# Patient Record
Sex: Female | Born: 1955 | Race: Black or African American | Hispanic: No | Marital: Married | Smoking: Former smoker
Health system: Southern US, Community
[De-identification: ages and names within clinical notes are randomized; demographics above are authoritative.]

## PROBLEM LIST (undated history)

## (undated) DIAGNOSIS — I10 Essential (primary) hypertension: Secondary | ICD-10-CM

## (undated) DIAGNOSIS — L97519 Non-pressure chronic ulcer of other part of right foot with unspecified severity: Secondary | ICD-10-CM

## (undated) DIAGNOSIS — K219 Gastro-esophageal reflux disease without esophagitis: Secondary | ICD-10-CM

## (undated) DIAGNOSIS — I739 Peripheral vascular disease, unspecified: Secondary | ICD-10-CM

## (undated) DIAGNOSIS — N184 Chronic kidney disease, stage 4 (severe): Secondary | ICD-10-CM

## (undated) DIAGNOSIS — E1129 Type 2 diabetes mellitus with other diabetic kidney complication: Secondary | ICD-10-CM

## (undated) DIAGNOSIS — E785 Hyperlipidemia, unspecified: Secondary | ICD-10-CM

## (undated) DIAGNOSIS — IMO0002 Reserved for concepts with insufficient information to code with codable children: Secondary | ICD-10-CM

## (undated) DIAGNOSIS — L97529 Non-pressure chronic ulcer of other part of left foot with unspecified severity: Secondary | ICD-10-CM

## (undated) DIAGNOSIS — E1165 Type 2 diabetes mellitus with hyperglycemia: Secondary | ICD-10-CM

## (undated) DIAGNOSIS — D638 Anemia in other chronic diseases classified elsewhere: Secondary | ICD-10-CM

## (undated) DIAGNOSIS — E1140 Type 2 diabetes mellitus with diabetic neuropathy, unspecified: Secondary | ICD-10-CM

## (undated) DIAGNOSIS — I878 Other specified disorders of veins: Secondary | ICD-10-CM

## (undated) DIAGNOSIS — I5030 Unspecified diastolic (congestive) heart failure: Secondary | ICD-10-CM

## (undated) DIAGNOSIS — E669 Obesity, unspecified: Secondary | ICD-10-CM

## (undated) DIAGNOSIS — Z9189 Other specified personal risk factors, not elsewhere classified: Secondary | ICD-10-CM

## (undated) HISTORY — DX: Peripheral vascular disease, unspecified: I73.9

## (undated) HISTORY — DX: Other specified personal risk factors, not elsewhere classified: Z91.89

## (undated) HISTORY — DX: Reserved for concepts with insufficient information to code with codable children: IMO0002

## (undated) HISTORY — DX: Other specified disorders of veins: I87.8

## (undated) HISTORY — DX: Type 2 diabetes mellitus with hyperglycemia: E11.65

## (undated) HISTORY — DX: Obesity, unspecified: E66.9

## (undated) HISTORY — DX: Gastro-esophageal reflux disease without esophagitis: K21.9

## (undated) HISTORY — PX: COLONOSCOPY: SHX174

## (undated) HISTORY — DX: Non-pressure chronic ulcer of other part of left foot with unspecified severity: L97.529

## (undated) HISTORY — DX: Chronic kidney disease, stage 4 (severe): N18.4

## (undated) HISTORY — DX: Essential (primary) hypertension: I10

## (undated) HISTORY — DX: Type 2 diabetes mellitus with other diabetic kidney complication: E11.29

## (undated) HISTORY — DX: Hyperlipidemia, unspecified: E78.5

## (undated) HISTORY — DX: Type 2 diabetes mellitus with diabetic neuropathy, unspecified: E11.40

## (undated) HISTORY — DX: Morbid (severe) obesity due to excess calories: E66.01

## (undated) HISTORY — DX: Anemia in other chronic diseases classified elsewhere: D63.8

## (undated) HISTORY — DX: Non-pressure chronic ulcer of other part of right foot with unspecified severity: L97.519

## (undated) HISTORY — PX: REFRACTIVE SURGERY: SHX103

## (undated) HISTORY — DX: Unspecified diastolic (congestive) heart failure: I50.30

## (undated) HISTORY — PX: EYE SURGERY: SHX253

---

## 2006-02-19 HISTORY — PX: TOE AMPUTATION: SHX809

## 2011-11-07 ENCOUNTER — Ambulatory Visit: Payer: Self-pay | Admitting: Family Medicine

## 2011-11-15 ENCOUNTER — Encounter: Payer: BC Managed Care – PPO | Attending: Family Medicine | Admitting: *Deleted

## 2011-11-15 ENCOUNTER — Encounter: Payer: Self-pay | Admitting: *Deleted

## 2011-11-15 VITALS — Ht 70.5 in | Wt 285.7 lb

## 2011-11-15 DIAGNOSIS — Z713 Dietary counseling and surveillance: Secondary | ICD-10-CM | POA: Insufficient documentation

## 2011-11-15 DIAGNOSIS — IMO0001 Reserved for inherently not codable concepts without codable children: Secondary | ICD-10-CM | POA: Insufficient documentation

## 2011-11-15 NOTE — Progress Notes (Signed)
  Patient was seen on 11/15/11 for the first of a series of three diabetes self-management courses at the [facility]. The following learning objectives were met by the patient during this course:   Defines the role of glucose and insulin  Identifies type of diabetes and pathophysiology  Defines the diagnostic criteria for diabetes and prediabetes  States the risk factors for Type 2 Diabetes  States the symptoms of Type 2 Diabetes  Defines Type 2 Diabetes treatment goals  Defines Type 2 Diabetes treatment options  States the rationale for glucose monitoring  Identifies A1C, glucose targets, and testing times  Identifies proper sharps disposal  Defines the purpose of a diabetes food plan  Identifies carbohydrate food groups  Defines effects of carbohydrate foods on glucose levels  Identifies carbohydrate choices/grams/food labels  States benefits of physical activity and effect on glucose  Review of suggested activity guidelines  Handouts given during class include:  Type 2 Diabetes: Basics Book  My Food Plan card  Follow-Up Plan: Attend core 2 and core 3 classes

## 2011-11-15 NOTE — Patient Instructions (Signed)
Goals:  Follow Diabetes Meal Plan as instructed  Eat 3 meals and 2 snacks, every 3-5 hrs  Limit carbohydrate intake to 30-45 grams carbohydrate/meal  Limit carbohydrate intake to 15 grams carbohydrate/snack  Add lean protein foods to meals/snacks  Monitor glucose levels as instructed by your doctor  Aim for 20 mins of physical activity daily  Bring food record and glucose log to your next nutrition visit

## 2011-12-06 ENCOUNTER — Encounter: Payer: BC Managed Care – PPO | Attending: Family Medicine | Admitting: Dietician

## 2011-12-06 DIAGNOSIS — E119 Type 2 diabetes mellitus without complications: Secondary | ICD-10-CM

## 2011-12-06 DIAGNOSIS — Z713 Dietary counseling and surveillance: Secondary | ICD-10-CM | POA: Insufficient documentation

## 2011-12-06 DIAGNOSIS — IMO0001 Reserved for inherently not codable concepts without codable children: Secondary | ICD-10-CM | POA: Insufficient documentation

## 2011-12-06 NOTE — Progress Notes (Signed)
  Patient was seen on 12/06/2011 for the second of a series of three diabetes self-management courses at the [facility]. The following learning objectives were met by the patient during this course:   Explain basic nutrition maintenance and quality assurance  Describe causes, symptoms and treatment of hypoglycemia and hyperglycemia  Explain how to manage diabetes during illness  Describe the importance of good nutrition for health and healthy eating strategies  List strategies to follow meal plan when dining out  Describe the effects of alcohol on glucose and how to use it safely  Describe problem solving skills for day-to-day glucose challenges  Describe strategies to use when treatment plan needs to change  Identify important factors involved in successful weight loss  Describe ways to remain physically active  Describe the impact of regular activity on insulin resistance   Handouts given in class:  Refrigerator magnet for Sick Day Guidelines  [facility] Oral medication/insulin handout  Follow-Up Plan: Patient will attend the final class of the ADA Diabetes Self-Care Education.   

## 2011-12-17 ENCOUNTER — Encounter: Payer: Self-pay | Admitting: Family Medicine

## 2011-12-17 ENCOUNTER — Ambulatory Visit (INDEPENDENT_AMBULATORY_CARE_PROVIDER_SITE_OTHER): Payer: BC Managed Care – PPO | Admitting: Family Medicine

## 2011-12-17 ENCOUNTER — Encounter (INDEPENDENT_AMBULATORY_CARE_PROVIDER_SITE_OTHER): Payer: BC Managed Care – PPO

## 2011-12-17 VITALS — BP 120/72 | HR 102 | Temp 97.6°F | Ht 70.5 in | Wt 282.0 lb

## 2011-12-17 DIAGNOSIS — I798 Other disorders of arteries, arterioles and capillaries in diseases classified elsewhere: Secondary | ICD-10-CM

## 2011-12-17 DIAGNOSIS — E1165 Type 2 diabetes mellitus with hyperglycemia: Secondary | ICD-10-CM

## 2011-12-17 DIAGNOSIS — Z7689 Persons encountering health services in other specified circumstances: Secondary | ICD-10-CM

## 2011-12-17 DIAGNOSIS — E785 Hyperlipidemia, unspecified: Secondary | ICD-10-CM

## 2011-12-17 DIAGNOSIS — Z23 Encounter for immunization: Secondary | ICD-10-CM

## 2011-12-17 DIAGNOSIS — L0390 Cellulitis, unspecified: Secondary | ICD-10-CM

## 2011-12-17 DIAGNOSIS — I739 Peripheral vascular disease, unspecified: Secondary | ICD-10-CM

## 2011-12-17 DIAGNOSIS — M7989 Other specified soft tissue disorders: Secondary | ICD-10-CM

## 2011-12-17 DIAGNOSIS — E1159 Type 2 diabetes mellitus with other circulatory complications: Secondary | ICD-10-CM

## 2011-12-17 DIAGNOSIS — I10 Essential (primary) hypertension: Secondary | ICD-10-CM

## 2011-12-17 DIAGNOSIS — Z7189 Other specified counseling: Secondary | ICD-10-CM

## 2011-12-17 LAB — COMPREHENSIVE METABOLIC PANEL
ALT: 25 U/L (ref 0–35)
AST: 20 U/L (ref 0–37)
Albumin: 3.6 g/dL (ref 3.5–5.2)
Alkaline Phosphatase: 94 U/L (ref 39–117)
BUN: 57 mg/dL — ABNORMAL HIGH (ref 6–23)
Calcium: 9.2 mg/dL (ref 8.4–10.5)
Chloride: 104 mEq/L (ref 96–112)
Creatinine, Ser: 2.8 mg/dL — ABNORMAL HIGH (ref 0.4–1.2)
Potassium: 4.1 mEq/L (ref 3.5–5.1)

## 2011-12-17 LAB — LIPID PANEL
Cholesterol: 121 mg/dL (ref 0–200)
LDL Cholesterol: 66 mg/dL (ref 0–99)
Total CHOL/HDL Ratio: 3
VLDL: 9.6 mg/dL (ref 0.0–40.0)

## 2011-12-17 MED ORDER — CLINDAMYCIN HCL 300 MG PO CAPS: 300.0000 mg | ORAL_CAPSULE | Freq: Three times a day (TID) | ORAL | Status: DC

## 2011-12-17 NOTE — Addendum Note (Signed)
Addended by: Colleen Can on: 12/17/2011 09:25 AM   Modules accepted: Orders

## 2011-12-17 NOTE — Patient Instructions (Addendum)
-  We have ordered labs or studies at this visit. It can take up to 1-2 weeks for results and processing. We will contact you with instructions IF your results are abnormal. Normal results will be released to your [facility]. If you have not heard from [place] or can not find your results in [facility] in 2 weeks please contact our office.  -We placed a referral for you to dermatology for your leg, endocrine for your diabetes and vascular as discussed. It usually takes about 1-2 weeks to process and schedule this referral. If you have not heard from [place] regarding this appointment in 2 weeks please contact our office.   -PLEASE SIGN UP FOR MYCHART TODAY   We recommend the following healthy lifestyle measures: - eat a healthy diet consisting of lots of vegetables, fruits, beans, nuts, seeds, healthy meats such as white chicken and fish and whole grains.  - avoid fried foods, fast food, processed foods, sodas, red meet and other fattening foods.  - get a least 150 minutes of aerobic exercise per week.   Follow up in: 1 month for follow up

## 2011-12-17 NOTE — Progress Notes (Signed)
Chief Complaint  Patient presents with  . Establish Care    HPI: Savannah Crawford is here to establish care. Recently moved to the area.  Has the following concerns and chronic problems:  R lower leg swelling and rash: -started about 4 months ago -skin changes, erythema and swelling, warmth, pain at time -saw physician (Dr. Ayesha Rumpf) and told kin changes were related to her diabetes -tried sivadene cream and lasix and potassium -denies: fevers, chills, malaise  DM: -seeing nutrition counselor -tx with amaryl and lantus 50 units bid and novolog by sliding scale -not on metformin because she had been on it a long time, now on amaryl - but this was stopped recently and now she is using novolog on sliding scale with meals if blood sugar elevated > 150, takes 8-10 units -hgba1c has been 12 for a long time -home BS: fasting has been 80-120; post prandial 160-220s -denies: renal failure, vision changes, polyuria, polydipsia, neuropathy, erectile dysfunction  HTN: -takes lotrel and hctz and toprol -denies: CP, SOB, palpitations, swelling -she also saw a heart doctor in [place] and she wants to establish care - she can't remember if she had and echo  HLP: -on lipitor Stable  PAD: -stable on pletal - had vascular doctor in [place] before she moved -wants to establish care with vascular doctor here  Other Providers: none  Needs tetanus vaccine and tdap Had pneumovax in past  ROS: See pertinent positives and negatives per HPI.  Past Medical History  Diagnosis Date  . Diabetes mellitus   . Hypertension   . Obesity   . Hyperlipidemia     Family History  Problem Relation Age of Onset  . Obesity Other   . Hypertension Other   . Hyperlipidemia Other   . Stroke Other   . Heart attack Other     History   Social History  . Marital Status: Married    Spouse Name: N/A    Number of Children: N/A  . Years of Education: N/A   Social History Main Topics  . Smoking status:  Never Smoker   . Smokeless tobacco: None  . Alcohol Use: None  . Drug Use: None  . Sexually Active: None   Other Topics Concern  . None   Social History Narrative  . None    Current outpatient prescriptions:amLODipine-benazepril (LOTREL) 5-40 MG per capsule, Take 1 capsule by mouth daily. , Disp: , Rfl: ;  atorvastatin (LIPITOR) 20 MG tablet, Take 20 mg by mouth daily. , Disp: , Rfl: ;  B-D ULTRAFINE III SHORT PEN 31G X 8 MM MISC, , Disp: , Rfl: ;  cilostazol (PLETAL) 100 MG tablet, Take 100 mg by mouth 2 (two) times daily. , Disp: , Rfl:  clindamycin (CLEOCIN) 300 MG capsule, Take 1 capsule (300 mg total) by mouth 3 (three) times daily., Disp: 30 capsule, Rfl: 0;  furosemide (LASIX) 40 MG tablet, Take 40 mg by mouth daily. , Disp: , Rfl: ;  glimepiride (AMARYL) 4 MG tablet, , Disp: , Rfl: ;  hydrochlorothiazide (MICROZIDE) 12.5 MG capsule, Take 12.5 mg by mouth every morning. , Disp: , Rfl: ;  KLOR-CON M10 10 MEQ tablet, Take 20 mEq by mouth daily. , Disp: , Rfl:  LANTUS SOLOSTAR 100 UNIT/ML injection, Inject 50 Units into the skin 2 (two) times daily. , Disp: , Rfl: ;  metoprolol succinate (TOPROL-XL) 25 MG 24 hr tablet, Take 25 mg by mouth daily. , Disp: , Rfl: ;  NOVOLOG FLEXPEN 100  UNIT/ML injection, Inject 12 Units into the skin 3 (three) times daily before meals. , Disp: , Rfl: ;  silver sulfADIAZINE (SILVADENE) 1 % cream, Apply 1 application topically daily. , Disp: , Rfl:   EXAM:  Filed Vitals:   12/17/11 0812  BP: 120/72  Pulse: 102  Temp: 97.6 F (36.4 C)    Body mass index is 39.89 kg/(m^2).  GENERAL: vitals reviewed and listed above, alert, oriented, appears well hydrated and in no acute distress  HEENT: atraumatic, conjunttiva clear, no obvious abnormalities on inspection of external nose and ears  NECK: no obvious masses on inspection  LUNGS: clear to auscultation bilaterally, no wheezes, rales or rhonchi, good air movement  CV: HRRR, + peripheral edema  SKIN:  swollen, erythematous R LE with chronic skin changes c/w PAD/stasis dermatitis  MS: moves all extremities without noticeable abnormality  PSYCH: pleasant and cooperative, no obvious depression or anxiety  ASSESSMENT AND PLAN:  Discussed the following assessment and plan:  1. DM (diabetes mellitus) type II uncontrolled, periph vascular disorder  Hemoglobin A1c, CMP, Ambulatory referral to Endocrinology  2. Hypertension    3. Hyperlipemia  Lipid Panel, CMP  4. Cellulitis  Ambulatory referral to Dermatology, clindamycin (CLEOCIN) 300 MG capsule  5. PAD (peripheral artery disease)  Ambulatory referral to Vascular Surgery  6. Swelling of right lower extremity  Lower Extremity Venous Duplex Right   -very complicated patient with many serious chronic problems and an acute issue of swelling and redness R LE -> abx in case cellulitis (risks/benefits discussed), refer to derm, LE [place] today, also get back in with vascular as follow in hometown by vascular prior to moved and on pletal -referral to endocrine for uncontrolled diabetes with complications, continue insulin and nutrition counseling, discussed serious implications of uncontrolled diabetes -We reviewed the PMH, PSH, FH, SH, Meds and Allergies. -We provided refills for any medications we will prescribe as needed. -We addressed current concerns per orders and patient instructions. -We have asked for records for pertinent exams, studies, vaccines and notes from previous providers. -We have advised patient to follow up per instructions below. -will need CPE once chronic and acute issues addressed -Influenza and tdap vaccine given today  -Patient advised to return or notify a doctor immediately if symptoms worsen or persist or new concerns arise.  Patient Instructions  -We have ordered labs or studies at this visit. It can take up to 1-2 weeks for results and processing. We will contact you with instructions IF your results are abnormal. Normal  results will be released to your [facility]. If you have not heard from [place] or can not find your results in [facility] in 2 weeks please contact our office.  -PLEASE SIGN UP FOR MYCHART TODAY   We recommend the following healthy lifestyle measures: - eat a healthy diet consisting of lots of vegetables, fruits, beans, nuts, seeds, healthy meats such as white chicken and fish and whole grains.  - avoid fried foods, fast food, processed foods, sodas, red meet and other fattening foods.  - get a least 150 minutes of aerobic exercise per week.   Follow up in:      Colin Benton R.

## 2011-12-18 ENCOUNTER — Encounter (HOSPITAL_COMMUNITY): Payer: Self-pay | Admitting: Emergency Medicine

## 2011-12-18 ENCOUNTER — Emergency Department (HOSPITAL_COMMUNITY)
Admission: EM | Admit: 2011-12-18 | Discharge: 2011-12-18 | Disposition: A | Discharge status: Home / Self Care | Payer: BC Managed Care – PPO | Attending: Emergency Medicine | Admitting: Emergency Medicine

## 2011-12-18 ENCOUNTER — Telehealth: Payer: Self-pay | Admitting: Family Medicine

## 2011-12-18 DIAGNOSIS — Z79899 Other long term (current) drug therapy: Secondary | ICD-10-CM | POA: Insufficient documentation

## 2011-12-18 DIAGNOSIS — N289 Disorder of kidney and ureter, unspecified: Secondary | ICD-10-CM | POA: Insufficient documentation

## 2011-12-18 DIAGNOSIS — E669 Obesity, unspecified: Secondary | ICD-10-CM | POA: Insufficient documentation

## 2011-12-18 DIAGNOSIS — E119 Type 2 diabetes mellitus without complications: Secondary | ICD-10-CM | POA: Insufficient documentation

## 2011-12-18 DIAGNOSIS — I10 Essential (primary) hypertension: Secondary | ICD-10-CM | POA: Insufficient documentation

## 2011-12-18 DIAGNOSIS — E785 Hyperlipidemia, unspecified: Secondary | ICD-10-CM | POA: Insufficient documentation

## 2011-12-18 LAB — COMPREHENSIVE METABOLIC PANEL
AST: 16 U/L (ref 0–37)
BUN: 52 mg/dL — ABNORMAL HIGH (ref 6–23)
CO2: 26 mEq/L (ref 19–32)
Calcium: 9.6 mg/dL (ref 8.4–10.5)
Chloride: 98 mEq/L (ref 96–112)
Creatinine, Ser: 2.59 mg/dL — ABNORMAL HIGH (ref 0.50–1.10)
GFR calc Af Amer: 23 mL/min — ABNORMAL LOW (ref >90)
GFR calc non Af Amer: 20 mL/min — ABNORMAL LOW (ref >90)
Glucose, Bld: 90 mg/dL (ref 70–99)
Total Bilirubin: 0.3 mg/dL (ref 0.3–1.2)

## 2011-12-18 LAB — CBC WITH DIFFERENTIAL/PLATELET
Basophils Absolute: 0 10*3/uL (ref 0.0–0.1)
Eosinophils Absolute: 0.4 10*3/uL (ref 0.0–0.7)
Eosinophils Relative: 6 % — ABNORMAL HIGH (ref 0–5)
MCH: 26 pg (ref 26.0–34.0)
MCV: 80.1 fL (ref 78.0–100.0)
Neutrophils Relative %: 48 % (ref 43–77)
Platelets: 271 10*3/uL (ref 150–400)
RDW: 14.3 % (ref 11.5–15.5)
WBC: 7.1 10*3/uL (ref 4.0–10.5)

## 2011-12-18 LAB — URINALYSIS, ROUTINE W REFLEX MICROSCOPIC
Bilirubin Urine: NEGATIVE
Hgb urine dipstick: NEGATIVE
Nitrite: NEGATIVE
Protein, ur: NEGATIVE mg/dL
Specific Gravity, Urine: 1.007 (ref 1.005–1.030)
Urobilinogen, UA: 0.2 mg/dL (ref 0.0–1.0)

## 2011-12-18 NOTE — ED Notes (Signed)
Urine sample provided if lab needs it.

## 2011-12-18 NOTE — Telephone Encounter (Signed)
New pt to me with Cr around 1.9 on labs 10/2011 now with Cr 2.8.  Very complicated pt seen once here with uncontrolled diabetes, possible cellulitis and nowlabs c/w acute renal injury. Will need evaluation in ED.  Called pt and unable to reach her. LM to call back. Spoke with spouse listed as contact and advised kidney function abnormal and needs to be seen in ED today asap. Spouse to let pt know now and pt can call me.

## 2011-12-18 NOTE — Telephone Encounter (Signed)
Talked with pt and relayed message. LE pain and erythema improving on abx. She will go to hospital for evaluation acute renal failure in the setting of uncontrolled diabetes, possible infection, PAD.

## 2011-12-18 NOTE — ED Notes (Signed)
Pt sent to ED by PCP for further evaluation of abnormal labs, per pt in ref to her kidneys. Pt denies pain, no urinary s/s of hematuria, burning or difficulty with urination

## 2011-12-18 NOTE — ED Notes (Signed)
NAD noted at time of d/c home 

## 2011-12-18 NOTE — ED Provider Notes (Signed)
History     CSN: [id number]  Arrival date & time 12/18/11  1505   First MD Initiated Contact with Patient 12/18/11 1515      Chief Complaint  Patient presents with  . Abnormal Lab    (Consider location/radiation/quality/duration/timing/severity/associated sxs/prior treatment) HPI Pt sent by PMD for elevated creatinine on routine blood work. Pt has had lower ext swelling x 2 months R > L. Doppler of RLE without DVT. Pt denies urinary changes, CP, SOB, N/V. No compalints Past Medical History  Diagnosis Date  . Diabetes mellitus   . Hypertension   . Obesity   . Hyperlipidemia     Past Surgical History  Procedure Date  . Eye surgery   . Toe amputation 2008    Family History  Problem Relation Age of Onset  . Obesity Other   . Hypertension Other   . Hyperlipidemia Other   . Stroke Other   . Heart attack Other     History  Substance Use Topics  . Smoking status: Never Smoker   . Smokeless tobacco: Not on file  . Alcohol Use: Not on file    OB History    Grav Para Term Preterm Abortions TAB SAB Ect Mult Living                  Review of Systems  Constitutional: Negative for fever and chills.  HENT: Negative for neck pain.   Respiratory: Negative for cough, shortness of breath, wheezing and stridor.   Cardiovascular: Positive for leg swelling. Negative for chest pain and palpitations.  Gastrointestinal: Negative for nausea, vomiting and abdominal pain.  Genitourinary: Negative for dysuria, frequency, hematuria and flank pain.  Skin: Negative for rash and wound.  Neurological: Negative for dizziness, weakness, light-headedness, numbness and headaches.    Allergies  Review of patient's allergies indicates no known allergies.  Home Medications   Current Outpatient Rx  Name Route Sig Dispense Refill  . AMLODIPINE BESY-BENAZEPRIL HCL 5-40 MG PO CAPS Oral Take 1 capsule by mouth daily.     . ATORVASTATIN CALCIUM 20 MG PO TABS Oral Take 20 mg by mouth daily.      Marland Kitchen CILOSTAZOL 100 MG PO TABS Oral Take 100 mg by mouth 2 (two) times daily.     Marland Kitchen CLINDAMYCIN HCL 300 MG PO CAPS Oral Take 300 mg by mouth 3 (three) times daily. For 10 days starting on 12/17/11    . FUROSEMIDE 40 MG PO TABS Oral Take 40 mg by mouth 2 (two) times daily.     Marland Kitchen HYDROCHLOROTHIAZIDE 12.5 MG PO CAPS Oral Take 12.5 mg by mouth every morning.     Marland Kitchen KLOR-CON M10 10 MEQ PO TBCR Oral Take 20 mEq by mouth daily.     Marland Kitchen LANTUS SOLOSTAR 100 UNIT/ML Casey SOLN Subcutaneous Inject 50 Units into the skin 2 (two) times daily.     Marland Kitchen METOPROLOL SUCCINATE ER 25 MG PO TB24 Oral Take 25 mg by mouth daily.     Marland Kitchen NOVOLOG FLEXPEN 100 UNIT/ML Jordan SOLN Subcutaneous Inject 8-10 Units into the skin 3 (three) times daily before meals. Only uses if over 150   - Depends on sugar reading    . BD PEN NEEDLE SHORT U/F 31G X 8 MM MISC        BP 120/70  Pulse 85  Temp 97.4 F (36.3 C) (Oral)  Resp 18  Ht 5\' 11"  (1.803 m)  Wt 250 lb (113.399 kg)  BMI 34.87 kg/m2  SpO2  97%  Physical Exam  Nursing note and vitals reviewed. Constitutional: She is oriented to person, place, and time. She appears well-developed and well-nourished. No distress.  HENT:  Head: Normocephalic and atraumatic.  Mouth/Throat: Oropharynx is clear and moist.  Eyes: EOM are normal. Pupils are equal, round, and reactive to light.  Neck: Normal range of motion. Neck supple.  Cardiovascular: Normal rate and regular rhythm.   Pulmonary/Chest: Effort normal and breath sounds normal. No respiratory distress. She has no wheezes. She has no rales. She exhibits no tenderness.  Abdominal: Soft. Bowel sounds are normal. She exhibits no mass. There is no tenderness. There is no rebound and no guarding.  Musculoskeletal: Normal range of motion. She exhibits edema (RLE > LLE swelling. No pitting. No tendernes). She exhibits no tenderness.  Neurological: She is alert and oriented to person, place, and time.       Moves all ext without deficit.  Ambulating without difficulty  Skin: Skin is warm and dry. No rash noted. No erythema.  Psychiatric: She has a normal mood and affect. Her behavior is normal.    ED Course  Procedures (including critical care time)  Labs Reviewed  COMPREHENSIVE METABOLIC PANEL - Abnormal; Notable for the following:    Sodium 134 (*)     BUN 52 (*)     Creatinine, Ser 2.59 (*)     GFR calc non Af Amer 20 (*)     GFR calc Af Amer 23 (*)     All other components within normal limits  CBC WITH DIFFERENTIAL - Abnormal; Notable for the following:    Hemoglobin 10.3 (*)     HCT 31.7 (*)     Eosinophils Relative 6 (*)     All other components within normal limits  URINALYSIS, ROUTINE W REFLEX MICROSCOPIC   No results found.   1. Renal insufficiency       MDM  Discussed with Dr Marye Round, [facility] hospitalist who reviewed pt information and recent studies. He does not believe inpatient workup of increased creatinine is warranted in this otherwise asymptomatic pt and that she can be referred by her PMD on an outpatient basis for nephrology consult.  Dr Marye Round advises holding Lotrel and Microzide. Pt is in agreement with plan. Advised to return immediately for SOB, abd pian, N/V, urinary changes or any concerns      Julianne Rice, MD 12/18/11 1825

## 2011-12-19 ENCOUNTER — Telehealth: Payer: Self-pay | Admitting: Family Medicine

## 2011-12-19 ENCOUNTER — Other Ambulatory Visit: Payer: Self-pay | Admitting: Family Medicine

## 2011-12-19 DIAGNOSIS — N189 Chronic kidney disease, unspecified: Secondary | ICD-10-CM

## 2011-12-19 DIAGNOSIS — N289 Disorder of kidney and ureter, unspecified: Secondary | ICD-10-CM

## 2011-12-19 NOTE — Progress Notes (Signed)
Pt sent to ED for acute on chronic renal insufficiency for evaluation of etiology and management. ED and hospitalist recs to see nephrology outpatient. Order placed for referral.

## 2011-12-19 NOTE — Telephone Encounter (Signed)
Pt went to ER as suggested. Pt was taken off of Lotrell and Microside. Need to discuss with Dr Maudie Mercury. Pls call.

## 2011-12-19 NOTE — Telephone Encounter (Signed)
[phone] (home)  Discussed implications of ARI with pt. She is feeling well and Cr had improved on labs in ED, which points to possible pre-renal etiology. Nephrotoxic medications held. Pt will check BP and notify me if values over 140/80. I will check on nephrology referral status. Pt reports redness, swelling and warmth in LE improving on abx. Pt will contact me if unwell, SOB, decreased urine output or any other concerns.

## 2011-12-20 ENCOUNTER — Telehealth: Payer: Self-pay

## 2011-12-20 ENCOUNTER — Encounter: Payer: BC Managed Care – PPO | Admitting: *Deleted

## 2011-12-20 DIAGNOSIS — E119 Type 2 diabetes mellitus without complications: Secondary | ICD-10-CM

## 2011-12-20 NOTE — Progress Notes (Signed)
  Patient was seen on 12/20/11 for the third of a series of three diabetes self-management courses at the [facility]. The following learning objectives were met by the patient during this course:    Describe how diabetes changes over time   Identify diabetes complications and ways to prevent them   Describe strategies that can promote heart health including lowering blood pressure and cholesterol   Describe strategies to lower dietary fat and sodium in the diet   Identify physical activities that benefit cardiovascular health   Evaluate success in meeting personal goal   Describe the belief that they can live successfully with diabetes day to day   Establish 2-3 goals that they will plan to diligently work on until they return for the free 27-month follow-up visit  The following handouts were given in class:  3 Month Follow Up Visit handout  Goal setting handout  Class evaluation form  Your patient has established the following 3 month goals for diabetes self-care:  Count carbohydrates for most meals and snacks  Increase activity at least 3 days a week  Follow-Up Plan: Patient will attend a 3 month follow-up visit for diabetes self-management education.

## 2011-12-20 NOTE — Patient Instructions (Signed)
Goals:  Follow Diabetes Meal Plan as instructed  Eat 3 meals and 2 snacks, every 3-5 hrs  Limit carbohydrate intake to 30-45 grams carbohydrate/meal  Limit carbohydrate intake to 15 grams carbohydrate/snack  Add lean protein foods to meals/snacks  Monitor glucose levels as instructed by your doctor  Aim for 30 mins of physical activity daily  Bring food record and glucose log to your next nutrition visit 

## 2011-12-20 NOTE — Telephone Encounter (Signed)
Per Dr. Maudie Mercury called pt to advise that a lab appt is needed today or tomorrow (11/1) for a bmp only.  Called and left a message for pt to return call.

## 2011-12-21 ENCOUNTER — Other Ambulatory Visit: Payer: Self-pay | Admitting: Family Medicine

## 2011-12-21 DIAGNOSIS — IMO0002 Reserved for concepts with insufficient information to code with codable children: Secondary | ICD-10-CM

## 2011-12-21 DIAGNOSIS — E1165 Type 2 diabetes mellitus with hyperglycemia: Secondary | ICD-10-CM

## 2011-12-21 NOTE — Telephone Encounter (Signed)
Spoke with pt and advised that she come in today for a bmp.  Pt states she lives in [place] and cannot come today.  Pt states she has been going to the doctor all week and she is in a financial bind.  Advised pt to come in on Monday 11/4 for lab work.

## 2011-12-24 ENCOUNTER — Other Ambulatory Visit (INDEPENDENT_AMBULATORY_CARE_PROVIDER_SITE_OTHER): Payer: BC Managed Care – PPO

## 2011-12-24 ENCOUNTER — Telehealth: Payer: Self-pay | Admitting: Family Medicine

## 2011-12-24 DIAGNOSIS — E1165 Type 2 diabetes mellitus with hyperglycemia: Secondary | ICD-10-CM

## 2011-12-24 LAB — BASIC METABOLIC PANEL
CO2: 24 mEq/L (ref 19–32)
Chloride: 109 mEq/L (ref 96–112)
Potassium: 4 mEq/L (ref 3.5–5.1)
Sodium: 139 mEq/L (ref 135–145)

## 2011-12-24 NOTE — Telephone Encounter (Signed)
Called and spoke with pt and pt is aware. Pt states the ankle part stopped swelling and rash is clearing up.  The calf of pt's leg is still swelling.  Pt states she is keeping leg elevated.  Advised pt to continue to keep leg elevated and monitor swelling and call if any new symptoms occur.

## 2011-12-24 NOTE — Telephone Encounter (Signed)
Kidney function is improving. Advise she keep appointment with nephrologist. Also, should have follow up with me in 1-2 weeks. Please ask her how her leg is doing. Thanks.

## 2011-12-31 ENCOUNTER — Other Ambulatory Visit: Payer: Self-pay | Admitting: *Deleted

## 2011-12-31 ENCOUNTER — Telehealth: Payer: Self-pay

## 2011-12-31 DIAGNOSIS — I739 Peripheral vascular disease, unspecified: Secondary | ICD-10-CM

## 2011-12-31 DIAGNOSIS — R600 Localized edema: Secondary | ICD-10-CM

## 2011-12-31 NOTE — Telephone Encounter (Signed)
Called pt to see if she wanted to

## 2012-01-08 ENCOUNTER — Encounter: Payer: Self-pay | Admitting: Vascular Surgery

## 2012-01-09 ENCOUNTER — Ambulatory Visit (INDEPENDENT_AMBULATORY_CARE_PROVIDER_SITE_OTHER): Payer: BC Managed Care – PPO | Admitting: Vascular Surgery

## 2012-01-09 ENCOUNTER — Encounter: Payer: Self-pay | Admitting: Family Medicine

## 2012-01-09 ENCOUNTER — Encounter: Payer: Self-pay | Admitting: Vascular Surgery

## 2012-01-09 ENCOUNTER — Ambulatory Visit (INDEPENDENT_AMBULATORY_CARE_PROVIDER_SITE_OTHER): Payer: BC Managed Care – PPO | Admitting: Family Medicine

## 2012-01-09 VITALS — BP 120/70 | HR 103 | Temp 97.7°F | Wt 278.0 lb

## 2012-01-09 VITALS — BP 135/74 | HR 86 | Resp 16 | Ht 70.5 in | Wt 275.0 lb

## 2012-01-09 DIAGNOSIS — R202 Paresthesia of skin: Secondary | ICD-10-CM

## 2012-01-09 DIAGNOSIS — N058 Unspecified nephritic syndrome with other morphologic changes: Secondary | ICD-10-CM

## 2012-01-09 DIAGNOSIS — E1140 Type 2 diabetes mellitus with diabetic neuropathy, unspecified: Secondary | ICD-10-CM

## 2012-01-09 DIAGNOSIS — R609 Edema, unspecified: Secondary | ICD-10-CM

## 2012-01-09 DIAGNOSIS — N184 Chronic kidney disease, stage 4 (severe): Secondary | ICD-10-CM | POA: Insufficient documentation

## 2012-01-09 DIAGNOSIS — I739 Peripheral vascular disease, unspecified: Secondary | ICD-10-CM

## 2012-01-09 DIAGNOSIS — E1149 Type 2 diabetes mellitus with other diabetic neurological complication: Secondary | ICD-10-CM

## 2012-01-09 DIAGNOSIS — E1159 Type 2 diabetes mellitus with other circulatory complications: Secondary | ICD-10-CM | POA: Insufficient documentation

## 2012-01-09 DIAGNOSIS — E1142 Type 2 diabetes mellitus with diabetic polyneuropathy: Secondary | ICD-10-CM

## 2012-01-09 DIAGNOSIS — R600 Localized edema: Secondary | ICD-10-CM

## 2012-01-09 DIAGNOSIS — I10 Essential (primary) hypertension: Secondary | ICD-10-CM

## 2012-01-09 DIAGNOSIS — E785 Hyperlipidemia, unspecified: Secondary | ICD-10-CM

## 2012-01-09 DIAGNOSIS — IMO0002 Reserved for concepts with insufficient information to code with codable children: Secondary | ICD-10-CM

## 2012-01-09 DIAGNOSIS — R209 Unspecified disturbances of skin sensation: Secondary | ICD-10-CM

## 2012-01-09 DIAGNOSIS — R200 Anesthesia of skin: Secondary | ICD-10-CM | POA: Insufficient documentation

## 2012-01-09 DIAGNOSIS — M7989 Other specified soft tissue disorders: Secondary | ICD-10-CM

## 2012-01-09 DIAGNOSIS — E1129 Type 2 diabetes mellitus with other diabetic kidney complication: Secondary | ICD-10-CM

## 2012-01-09 DIAGNOSIS — E1121 Type 2 diabetes mellitus with diabetic nephropathy: Secondary | ICD-10-CM

## 2012-01-09 HISTORY — DX: Chronic kidney disease, stage 4 (severe): N18.4

## 2012-01-09 HISTORY — DX: Type 2 diabetes mellitus with diabetic neuropathy, unspecified: E11.40

## 2012-01-09 NOTE — Progress Notes (Signed)
[facility]  Patient name: Savannah Crawford MRN: [id number] DOB: 12-31-1955 Sex: female  REASON FOR CONSULT: evaluate for peripheral vascular disease. Referred by Dr.Hannah Maudie Mercury  HPI: Savannah Crawford is a 56 y.o. female who states that she has been having some swelling in her legs since July. Her symptoms are more significant on the right side. Her symptoms began gradually. She denies any previous history of DVT or phlebitis. She has had no injury to her leg. She gives no history of claudication or rest pain. She has noted some darkish discoloration of the skin her right leg. She denies any open ulcers on her feet.  I did review her records from Dr. Julianne Rice office. She does have a history of diabetes which is been under good control. Likewise she has hypertension and hypercholesterolemia which are under good control.  Past Medical History  Diagnosis Date  . Diabetes mellitus   . Hypertension   . Obesity   . Hyperlipidemia   . Peripheral arterial disease     Family History  Problem Relation Age of Onset  . Obesity Other   . Hypertension Other   . Hyperlipidemia Other   . Stroke Other   . Heart attack Other   . Diabetes Mother   . Hyperlipidemia Mother   . Hypertension Mother   . Heart attack Mother   . Stroke Mother   . Diabetes Sister   . Hypertension Brother   . Diabetes Sister   . Diabetes Sister   . Diabetes Sister     SOCIAL HISTORY: History  Substance Use Topics  . Smoking status: Former Smoker    Types: Cigarettes    Quit date: 02/20/1999  . Smokeless tobacco: Never Used  . Alcohol Use: Yes    No Known Allergies  Current Outpatient Prescriptions  Medication Sig Dispense Refill  . atorvastatin (LIPITOR) 20 MG tablet Take 20 mg by mouth daily.       . B-D ULTRAFINE III SHORT PEN 31G X 8 MM MISC       . cilostazol (PLETAL) 100 MG tablet Take 100 mg by mouth 2 (two) times daily.       . fluocinonide cream (LIDEX) 0.05 %       .  furosemide (LASIX) 40 MG tablet Take 40 mg by mouth 2 (two) times daily.       . hydrochlorothiazide (MICROZIDE) 12.5 MG capsule Take 12.5 mg by mouth every morning.       Marland Kitchen LANTUS SOLOSTAR 100 UNIT/ML injection Inject 50 Units into the skin 2 (two) times daily.       Marland Kitchen lisinopril (PRINIVIL,ZESTRIL) 5 MG tablet Take 5 mg by mouth daily.       . metoprolol succinate (TOPROL-XL) 25 MG 24 hr tablet Take 25 mg by mouth daily.       Marland Kitchen NOVOLOG FLEXPEN 100 UNIT/ML injection Inject 8-10 Units into the skin 3 (three) times daily before meals. Only uses if over 150   - Depends on sugar reading      . Vitamin D, Ergocalciferol, (DRISDOL) 50000 UNITS CAPS 1 Units.       Marland Kitchen amLODipine-benazepril (LOTREL) 5-40 MG per capsule Take 1 capsule by mouth daily.       . clindamycin (CLEOCIN) 300 MG capsule Take 300 mg by mouth 3 (three) times daily. For 10 days starting on 12/17/11      . KLOR-CON M10 10 MEQ tablet Take 20 mEq by mouth daily.  REVIEW OF SYSTEMS: Valu.Nieves ] denotes positive finding; [  ] denotes negative finding  CARDIOVASCULAR:  [ ]  chest pain   [ ]  chest pressure   [ ]  palpitations   [ ]  orthopnea   Valu.Nieves ] dyspnea on exertion   [ ]  claudication   [ ]  rest pain   [ ]  DVT   [ ]  phlebitis PULMONARY:   [ ]  productive cough   [ ]  asthma   [ ]  wheezing NEUROLOGIC:   [ ]  weakness  [ ]  paresthesias  [ ]  aphasia  [ ]  amaurosis  Valu.Nieves ] dizziness HEMATOLOGIC:   [ ]  bleeding problems   [ ]  clotting disorders MUSCULOSKELETAL:  [ ]  joint pain   [ ]  joint swelling Valu.Nieves ] leg swelling GASTROINTESTINAL: [ ]   blood in stool  [ ]   hematemesis GENITOURINARY:  [ ]   dysuria  [ ]   hematuria PSYCHIATRIC:  [ ]  history of major depression INTEGUMENTARY:  Valu.Nieves ] rashes  Valu.Nieves ] ulcers CONSTITUTIONAL:  [ ]  fever   [ ]  chills  PHYSICAL EXAM: Filed Vitals:   01/09/12 1413  BP: 135/74  Pulse: 86  Resp: 16  Height: 5' 10.5" (1.791 m)  Weight: 275 lb (124.739 kg)  SpO2: 97%   Body mass index is 38.90 kg/(m^2). GENERAL: The  patient is a well-nourished female, in no acute distress. The vital signs are documented above. CARDIOVASCULAR: There is a regular rate and rhythm. I do not detect carotid bruits. She has palpable dorsalis pedis pulses bilaterally. She has mild bilateral lower extremity swelling. This is more significant on the right side. PULMONARY: There is good air exchange bilaterally without wheezing or rales. ABDOMEN: Soft and non-tender with normal pitched bowel sounds.  MUSCULOSKELETAL: There are no major deformities or cyanosis. NEUROLOGIC: No focal weakness or paresthesias are detected. SKIN: she has hyperpigmentation in the right leg consistent with chronic venous insufficiency.PSYCHIATRIC: The patient has a normal affect.  DATA:  I have independently interpreted her arterial Doppler study which shows triphasic Doppler signals in the posterior tibial and dorsalis pedis positions bilaterally. ABI on the right is 95%. ABI on the left is 96%. Toe pressure on the right is 133 mm of mercury. Toe pressure on the left is 146 mm of mercury. All these findings are consistent with normal arterial flow.  I have also independently interpreted her venous duplex study. This shows no evidence of DVT of the right lower extremity. In addition there is no evidence of significant reflux in the common femoral vein common femoral vein, popliteal vein or saphenous vein.  MEDICAL ISSUES: I reassured the patient that based on her exam and Doppler study she has no evidence of arterial insufficiency. Given that she has hyperpigmentation of the right leg I suspect that she does have some chronic venous insufficiency or possibly some mild lymphedema which explains her swelling. However there was no gross reflux seen on her venous duplex study. I did reassure her that she has no evidence of DVT. I have recommended intermittent leg elevation and I have discussed the proper position for this. In addition I have written her a prescription  for a knee-high compression stockings with a 20-30 mm mercury gradient. I've also instructed her to keep her skin well lubricated especially in the winter. I will be happy to see her back at any time if any new vascular issues arise.   [place] [facility] [place] Beeper: [phone]

## 2012-01-09 NOTE — Progress Notes (Signed)
Right lower extremity venous duplex performed @ [facility] 01/09/2012

## 2012-01-09 NOTE — Progress Notes (Signed)
Chief Complaint  Patient presents with  . Follow-up    HPI:  Follow up: Relatively new patient to me with MMP.  DM: -home BS: fasting 65-102, postprandial highest is 2 "something", on has values over 200 very rarely the last 3 months -meds: lantus 50 units twice dialy , novolog with meals if blood sugar is greater the 200 before meals - has not needed this lately -eye exam: last exam in June in [place] -foot exam: used to see a podiatrist in [place], does not have podiatrist here, + neuropathy -denies: fevers, wounds, polyuria, polydipsia -pt referred to endocrine last visit due to her self reported chronic very poorly controlled Diabetes -currently seeing nutritionist and working on diet, getting ready to start going to the gym  - went to zumba class a few days ago -hgbA1c 9.9 11/2011  AOCRI: -acute kidney injury on labs last visit -seen in ED and felt stable to see renal outpatient -referred to nephrology and nephrotoxic meds held - saw renal doctor last week -nephrologist restarted: lisinopril 5mg  daily and lasix which she had been prescribed for bilateral LE edema -she has follow up with nephrologist in 1 month and she is going to have a renal US then as well -She [facility] at [facility] and has Chronic Kidney Disease Stage IV (severe) but labs looked better per patient  HTN: -HTCZ, toprol and lotrel and lisinopril just added back -Denies: CP, SOB, increased swelling  LE Edema: -reports saw a cardiologist in [place] in March 2013 -has stress test and told heart was ok  -cardiologist started her on metoprolol and statin -Dr. Geralyn Flash in [place]: -at goal 11/2011 -on statin  Cellulitis: -started on clinda last visit and improved per phone notes -much improved  PAD: -followed by vascular in her home town and referred to establish here last visit, sees vascular doc here today -on pletal  ROS: See pertinent positives and negatives per  HPI.  Past Medical History  Diagnosis Date  . Diabetes mellitus   . Hypertension   . Obesity   . Hyperlipidemia     Family History  Problem Relation Age of Onset  . Obesity Other   . Hypertension Other   . Hyperlipidemia Other   . Stroke Other   . Heart attack Other     History   Social History  . Marital Status: Married    Spouse Name: N/A    Number of Children: N/A  . Years of Education: N/A   Social History Main Topics  . Smoking status: Never Smoker   . Smokeless tobacco: None  . Alcohol Use: None  . Drug Use: None  . Sexually Active: None   Other Topics Concern  . None   Social History Narrative  . None    Current outpatient prescriptions:atorvastatin (LIPITOR) 20 MG tablet, Take 20 mg by mouth daily. , Disp: , Rfl: ;  B-D ULTRAFINE III SHORT PEN 31G X 8 MM MISC, , Disp: , Rfl: ;  cilostazol (PLETAL) 100 MG tablet, Take 100 mg by mouth 2 (two) times daily. , Disp: , Rfl: ;  fluocinonide cream (LIDEX) 0.05 %, , Disp: , Rfl: ;  furosemide (LASIX) 40 MG tablet, Take 40 mg by mouth 2 (two) times daily. , Disp: , Rfl:  LANTUS SOLOSTAR 100 UNIT/ML injection, Inject 50 Units into the skin 2 (two) times daily. , Disp: , Rfl: ;  lisinopril (PRINIVIL,ZESTRIL) 5 MG tablet, Take 5 mg by mouth  daily. , Disp: , Rfl: ;  NOVOLOG FLEXPEN 100 UNIT/ML injection, Inject 8-10 Units into the skin 3 (three) times daily before meals. Only uses if over 150   - Depends on sugar reading, Disp: , Rfl: ;  Vitamin D, Ergocalciferol, (DRISDOL) 50000 UNITS CAPS, , Disp: , Rfl:  amLODipine-benazepril (LOTREL) 5-40 MG per capsule, Take 1 capsule by mouth daily. , Disp: , Rfl: ;  clindamycin (CLEOCIN) 300 MG capsule, Take 300 mg by mouth 3 (three) times daily. For 10 days starting on 12/17/11, Disp: , Rfl: ;  hydrochlorothiazide (MICROZIDE) 12.5 MG capsule, Take 12.5 mg by mouth every morning. , Disp: , Rfl: ;  KLOR-CON M10 10 MEQ tablet, Take 20 mEq by mouth daily. , Disp: , Rfl:  metoprolol succinate  (TOPROL-XL) 25 MG 24 hr tablet, Take 25 mg by mouth daily. , Disp: , Rfl:   EXAM:  Filed Vitals:   01/09/12 1041  BP: 120/70  Pulse: 103  Temp: 97.7 F (36.5 C)    There is no height on file to calculate BMI.  GENERAL: vitals reviewed and listed above, alert, oriented, appears well hydrated and in no acute distress  HEENT: atraumatic, conjunttiva clear, no obvious abnormalities on inspection of external nose and ears  NECK: no obvious masses on inspection  LUNGS: clear to auscultation bilaterally, no wheezes, rales or rhonchi, good air movement  CV: HRRR, no peripheral edema  MS: moves all extremities without noticeable abnormality  SKIN: improved appearance of skin LE with chronic vascular/venous stasis changes - but does not appear to have any remaining infection  PSYCH: pleasant and cooperative, no obvious depression or anxiety  DIABETIC FOOT EXAM: see diabetic foot exam section  ASSESSMENT AND PLAN:  Discussed the following assessment and plan:  1. Type 2 diabetes mellitus, uncontrolled, with neuropathy  -pt working hard with diet and on starting to exercise -sugars improved at home and will continue current insulin and pt will contact me if BS running high at home -she would like cancel seeing endocrine for now and see them if not improve when we recheck hemoglobin A1c next visit  2. Diabetic nephropathy -followed by nephrologist at [facility], discussed importance of controlling diabetes and HTN to prevent progression  3. Hypertension - well controlled this visit, cont current medicaitons  4. Edema - pt seeing vascular today, advised elevation, daily skin and foot exams, compression and continued lasix -will obtain records from cardiologist she saw in [place], [place] working on this  5. Chronic kidney disease (CKD), stage IV (severe) - see above  6. PAD (peripheral artery disease) - seeing vascular today  7. Hyperlipidemia - stable  8. Cellulitis - resolved with  abx  -Patient advised to return or notify a doctor immediately if symptoms worsen or persist or new concerns arise.  Patient Instructions  -continue to work on diet and exercise  -continue medications - please let me know if sugars running high: Fasting > 120 or 2 hours after meals running > 160  -We placed a referral for you to the podiatrist as discussed. It usually takes about 1-2 weeks to process and schedule this referral. If you have not heard from [place] regarding this appointment in 2 weeks please contact our office.  -Please follow up in 2 months and will recheck labs then      Savannah Benton R.

## 2012-01-09 NOTE — Progress Notes (Signed)
Ankle brachial index performed @ [facility] 01/09/2012

## 2012-01-09 NOTE — Patient Instructions (Signed)
-  continue to work on diet and exercise  -continue medications - please let me know if sugars running high: Fasting > 120 or 2 hours after meals running > 160  -We placed a referral for you to the podiatrist as discussed. It usually takes about 1-2 weeks to process and schedule this referral. If you have not heard from [place] regarding this appointment in 2 weeks please contact our office.  -Please follow up in 2 months and will recheck labs then

## 2012-01-10 ENCOUNTER — Telehealth: Payer: Self-pay | Admitting: Family Medicine

## 2012-01-10 NOTE — Telephone Encounter (Signed)
Message     No. She can stop her Pletal from my standpoint. She has no significant arterial insufficiency. It is sometimes used to help heal venous ulcers. I would stop it.   Gerald Stabs   ----- Message -----   From: Lucretia Kern, DO   Sent: 01/10/2012 6:32 AM   To: Angelia Mould, MD      Thank you for your note. [name] told me she was followed by vascular surgery in [place] prior to her recent move and was on pletal for her claudication. Given your findings, do you recommend she continue this medication? Thanks.      Jarrett Soho   ----- Message -----   From: Angelia Mould, MD   Sent: 01/09/2012 2:45 PM   To: Lucretia Kern, DO      Lars Mage,  Please contact Ms. [name] and let her know that Dr. Scot Dock feels it is ok for her to stop the Pletal. Thanks.  Dr. Maudie Mercury

## 2012-01-10 NOTE — Telephone Encounter (Signed)
Called and spoke with pt and pt is aware to stop pletal.

## 2012-01-14 ENCOUNTER — Other Ambulatory Visit: Payer: Self-pay

## 2012-01-14 MED ORDER — INSULIN PEN NEEDLE 31G X 8 MM MISC: Status: DC

## 2012-01-15 ENCOUNTER — Encounter: Payer: Self-pay | Admitting: Family Medicine

## 2012-01-15 DIAGNOSIS — Z9189 Other specified personal risk factors, not elsewhere classified: Secondary | ICD-10-CM

## 2012-01-15 DIAGNOSIS — I5030 Unspecified diastolic (congestive) heart failure: Secondary | ICD-10-CM | POA: Insufficient documentation

## 2012-01-15 HISTORY — DX: Other specified personal risk factors, not elsewhere classified: Z91.89

## 2012-01-15 HISTORY — DX: Unspecified diastolic (congestive) heart failure: I50.30

## 2012-02-22 ENCOUNTER — Other Ambulatory Visit: Payer: Self-pay | Admitting: Family Medicine

## 2012-02-26 NOTE — Telephone Encounter (Signed)
Pt is out of meds

## 2012-02-27 NOTE — Telephone Encounter (Signed)
Rx called in to pharmacy. 

## 2012-03-10 ENCOUNTER — Encounter: Payer: Self-pay | Admitting: Family Medicine

## 2012-03-10 ENCOUNTER — Ambulatory Visit (INDEPENDENT_AMBULATORY_CARE_PROVIDER_SITE_OTHER): Payer: BC Managed Care – PPO | Admitting: Family Medicine

## 2012-03-10 ENCOUNTER — Telehealth: Payer: Self-pay | Admitting: Family Medicine

## 2012-03-10 VITALS — BP 120/78 | HR 74 | Temp 98.1°F | Wt 279.0 lb

## 2012-03-10 DIAGNOSIS — E1129 Type 2 diabetes mellitus with other diabetic kidney complication: Secondary | ICD-10-CM

## 2012-03-10 DIAGNOSIS — I872 Venous insufficiency (chronic) (peripheral): Secondary | ICD-10-CM

## 2012-03-10 DIAGNOSIS — E11319 Type 2 diabetes mellitus with unspecified diabetic retinopathy without macular edema: Secondary | ICD-10-CM

## 2012-03-10 DIAGNOSIS — N058 Unspecified nephritic syndrome with other morphologic changes: Secondary | ICD-10-CM

## 2012-03-10 DIAGNOSIS — Z9189 Other specified personal risk factors, not elsewhere classified: Secondary | ICD-10-CM

## 2012-03-10 DIAGNOSIS — E785 Hyperlipidemia, unspecified: Secondary | ICD-10-CM

## 2012-03-10 DIAGNOSIS — I10 Essential (primary) hypertension: Secondary | ICD-10-CM

## 2012-03-10 DIAGNOSIS — E1165 Type 2 diabetes mellitus with hyperglycemia: Secondary | ICD-10-CM

## 2012-03-10 DIAGNOSIS — Z789 Other specified health status: Secondary | ICD-10-CM

## 2012-03-10 DIAGNOSIS — I878 Other specified disorders of veins: Secondary | ICD-10-CM

## 2012-03-10 HISTORY — DX: Other specified disorders of veins: I87.8

## 2012-03-10 LAB — HEMOGLOBIN A1C: Hgb A1c MFr Bld: 9.2 % — ABNORMAL HIGH (ref 4.6–6.5)

## 2012-03-10 LAB — LIPID PANEL
Cholesterol: 140 mg/dL (ref 0–200)
HDL: 49.1 mg/dL (ref >39.00)
LDL Cholesterol: 80 mg/dL (ref 0–99)
Triglycerides: 53 mg/dL (ref 0.0–149.0)
VLDL: 10.6 mg/dL (ref 0.0–40.0)

## 2012-03-10 LAB — BASIC METABOLIC PANEL
BUN: 29 mg/dL — ABNORMAL HIGH (ref 6–23)
Calcium: 8.6 mg/dL (ref 8.4–10.5)
Creatinine, Ser: 1.8 mg/dL — ABNORMAL HIGH (ref 0.4–1.2)
GFR: 37.39 mL/min — ABNORMAL LOW (ref >60.00)
Glucose, Bld: 102 mg/dL — ABNORMAL HIGH (ref 70–99)
Potassium: 3.6 mEq/L (ref 3.5–5.1)

## 2012-03-10 NOTE — Telephone Encounter (Signed)
Please let her know:  -cholesterol is good -renal function is stable -diabetes is mildly improved - but still uncontrolled - as we discussed I placed a referral to the endocrinologist for your diabetes -advise continued hard work on diet and exercise - would advise she continue to see the nutritionist -keep a log of blood sugars to take to the endocrine appointment and to all appointments  Savannah Crawford, Also, PLEASE [place] IS AT HER house and update in chart

## 2012-03-10 NOTE — Progress Notes (Addendum)
Chief Complaint  Patient presents with  . 2 month follow up    HPI:  Follow up Diabetes, HTN, HLD: She doe snot know what medications she is taking. She is not very aware of her medical problems.  DM: -home sugars: fasting usually in 120s -last HgbA1c 9.9 -current meds: lantus 50 U bid, novolog only taking if high blood sugar before meals -she doesn't know her other medications -last eye exam: hx of diabetic retinopathy, June of last year -denies: vision changes, polyuria, fevers -not on asa, not on metformin - reports this was stopped a long time ago -she is going to the gym on a regular basis and trying to improve her diet  HTN/HLD: -taking lotrel, lasix, HCTZ, metoprolol, ? Lisinopril - on two acei per med list - not sure why -denies: CP, SOB, palpitations ROS: See pertinent positives and negatives per HPI.  Venous stasis dermatitis: -saw vasc surg recently, advised stopping pletal - pt still taking this   Past Medical History  Diagnosis Date  . Diabetes mellitus   . Hypertension   . Obesity   . Hyperlipidemia   . Peripheral arterial disease   . Diabetes mellitus with renal manifestations, uncontrolled 01/09/2012  . Diabetic neuropathy 01/09/2012  . Venous stasis of lower extremity 03/10/2012  . Diastolic heart failure - stage 1 per review of echo results from 02/2010 01/15/2012  . At risk for heart disease - +stres test 2012 followed by [facility], [place] with neg cardiac cath 03/2010 (non-obstructive CAD) per review of records, echo 02/2010 normal LVF 01/15/2012    Family History  Problem Relation Age of Onset  . Obesity Other   . Hypertension Other   . Hyperlipidemia Other   . Stroke Other   . Heart attack Other   . Diabetes Mother   . Hyperlipidemia Mother   . Hypertension Mother   . Heart attack Mother   . Stroke Mother   . Diabetes Sister   . Hypertension Brother   . Diabetes Sister   . Diabetes Sister   . Diabetes Sister     History    Social History  . Marital Status: Married    Spouse Name: N/A    Number of Children: N/A  . Years of Education: N/A   Social History Main Topics  . Smoking status: Former Smoker    Types: Cigarettes    Quit date: 02/20/1999  . Smokeless tobacco: Never Used  . Alcohol Use: Yes  . Drug Use: Yes  . Sexually Active: None   Other Topics Concern  . None   Social History Narrative  . None    Current outpatient prescriptions:amLODipine-benazepril (LOTREL) 5-40 MG per capsule, Take 1 capsule by mouth daily. , Disp: , Rfl: ;  atorvastatin (LIPITOR) 20 MG tablet, TAKE 1 TABLET BY MOUTH EVERY DAY, Disp: 30 tablet, Rfl: 11;  clindamycin (CLEOCIN) 300 MG capsule, Take 300 mg by mouth 3 (three) times daily. For 10 days starting on 12/17/11, Disp: , Rfl: ;  fluocinonide cream (LIDEX) 0.05 %, , Disp: , Rfl:  hydrochlorothiazide (MICROZIDE) 12.5 MG capsule, Take 12.5 mg by mouth every morning. , Disp: , Rfl: ;  Insulin Pen Needle (B-D ULTRAFINE III SHORT PEN) 31G X 8 MM MISC, Use as directed., Disp: 100 each, Rfl: 5;  LANTUS SOLOSTAR 100 UNIT/ML injection, Inject 50 Units into the skin 2 (two) times daily. , Disp: , Rfl: ;  lisinopril (PRINIVIL,ZESTRIL) 5 MG tablet, Take 5 mg by mouth daily. ,  Disp: , Rfl:  metoprolol succinate (TOPROL-XL) 25 MG 24 hr tablet, TAKE 1 TABLET BY MOUTH EVERY DAY, Disp: 30 tablet, Rfl: 11;  NOVOLOG FLEXPEN 100 UNIT/ML injection, Inject 8-10 Units into the skin 3 (three) times daily before meals. Only uses if over 150   - Depends on sugar reading, Disp: , Rfl: ;  Vitamin D, Ergocalciferol, (DRISDOL) 50000 UNITS CAPS, 1 Units. , Disp: , Rfl:  furosemide (LASIX) 40 MG tablet, Take 40 mg by mouth 2 (two) times daily. , Disp: , Rfl: ;  KLOR-CON M10 10 MEQ tablet, Take 20 mEq by mouth daily. , Disp: , Rfl:   EXAM:  Filed Vitals:   03/10/12 0832  BP: 120/78  Pulse: 74  Temp: 98.1 F (36.7 C)    There is no height on file to calculate BMI.  GENERAL: vitals reviewed and  listed above, alert, oriented, appears well hydrated and in no acute distress  HEENT: atraumatic, conjunttiva clear, no obvious abnormalities on inspection of external nose and ears  NECK: no obvious masses on inspection  LUNGS: clear to auscultation bilaterally, no wheezes, rales or rhonchi, good air movement  CV: HRRR, no peripheral edema  MS: moves all extremities without noticeable abnormality  PSYCH: pleasant and cooperative, no obvious depression or anxiety  -SKIN: feet assessed - no wounds, small callus formation and thickened toenails  ASSESSMENT AND PLAN:  Discussed the following assessment and plan:  1. Venous stasis of lower extremity    2. Diabetes mellitus with renal manifestations, uncontrolled  Hemoglobin A1c, Ambulatory referral to Podiatry  3. Hypertension  Basic metabolic panel  4. At risk for heart disease - +stres test 2012 followed by [facility], [place] with neg cardiac cath 03/2010 (non-obstructive CAD) per review of records, echo 02/2010 normal LVF    5. Hyperlipidemia  Lipid Panel   -many medical problems and poor understanding of disease, does not know her medications and has not followed previous instructions -she will call my nurse tomorrow morning with med list -will obtain recent nephro notes to see if this explains two acei - will stop one if not recommended by nephro -congratulated on lifestyle changes -if diabetes still uncontrolled this check will refer to endocrine to assist - may be able to restart metformin, use carb based meal-time insulin, etc -discussed ASA use -provided BS log book, educated on when to check, how to use mealtime insulin and management of low blood sugars -podiatry referral placed  -advised to use compression stockings daily  -Patient advised to return or notify a doctor immediately if symptoms worsen or persist or new concerns arise.  Patient Instructions  -call my nurse and tell her all the medications you are  taking  -we will check labs today and if your diabetes is not under control will place a referral to the specialist for your diabetes  -we will place another referral to podiatry  -check blood sugars:  -FASTING (first thing in the morning before eating)  -POSTPRANDIAL (2 hours after meals)  -Keep all of these value in the log book and bring this to every appointment  -continue lantus, use novolog with meals - 5 units if small meal, 8-10 units if large meal  -BRING MEDICATIONS TO EVERY APPOINTMENT  -Stop the Pletal  -follow up in 1 month       Lemarcus Baggerly R.  Received last OV note from nephrology. BP meds include lasix 40 qd, lisinopril 5 mg daily, toprol xl 25mg  daily. Pt may be  taking lotrel as well as had this at home and had been on it prior to hospitalization. Will have nurse call her to review med list then will DC one of the Aceis if taking both.

## 2012-03-10 NOTE — Patient Instructions (Addendum)
-  call my nurse and tell her all the medications you are taking  -we will check labs today and if your diabetes is not under control will place a referral to the specialist for your diabetes  -we will place another referral to podiatry  -check blood sugars:  -FASTING (first thing in the morning before eating)  -POSTPRANDIAL (2 hours after meals)  -Keep all of these value in the log book and bring this to every appointment  -continue lantus, use novolog with meals - 5 units if small meal, 8-10 units if large meal  -BRING MEDICATIONS TO EVERY APPOINTMENT  -Stop the Pletal  -follow up in 1 month

## 2012-03-10 NOTE — Telephone Encounter (Signed)
Called and spoke with pt and pt is aware of lab results.  Pt states she will call on 03/11/12 to update medication list.

## 2012-03-10 NOTE — Telephone Encounter (Signed)
Left a message for pt to return call 

## 2012-03-11 ENCOUNTER — Other Ambulatory Visit: Payer: Self-pay

## 2012-03-11 ENCOUNTER — Telehealth: Payer: Self-pay

## 2012-03-11 NOTE — Telephone Encounter (Signed)
Ok - please update med list and discontinue any medications she is not taking. Please advise her we will continue meds she is taking now until we see her at her appointment.

## 2012-03-11 NOTE — Telephone Encounter (Signed)
Medications not taken discontinued.

## 2012-03-11 NOTE — Telephone Encounter (Signed)
Called and spoke with pt and pt is aware.  Appt made for 04/10/2012 pt is aware.

## 2012-03-11 NOTE — Telephone Encounter (Signed)
Pt called and noted the current medications she is taking.  Pt states she is taking lipitor 20 mg qd; vitamin d 50000 units once a month, lasix 40 mg bid; novolog flexpen 8-10 units into the skin 3 dimes daily; lantus solostar 100 units/ml inject 50 units into the skin 2 times daily; lisinopril 5 mg qd; and metoprolol succinate 25 mg qd.  Pls advise.

## 2012-03-21 ENCOUNTER — Ambulatory Visit: Payer: BC Managed Care – PPO | Admitting: *Deleted

## 2012-04-04 ENCOUNTER — Other Ambulatory Visit: Payer: Self-pay | Admitting: Family Medicine

## 2012-04-05 NOTE — Telephone Encounter (Signed)
Savannah Crawford,  I referred her to endocrinology for her uncontrolled diabetes. Can you find out the status of this referral? Thanks!

## 2012-04-10 ENCOUNTER — Encounter: Payer: Self-pay | Admitting: Family Medicine

## 2012-04-10 ENCOUNTER — Ambulatory Visit (INDEPENDENT_AMBULATORY_CARE_PROVIDER_SITE_OTHER): Payer: BC Managed Care – PPO | Admitting: Family Medicine

## 2012-04-10 VITALS — BP 130/80 | HR 84 | Temp 97.8°F | Wt 283.0 lb

## 2012-04-10 DIAGNOSIS — N058 Unspecified nephritic syndrome with other morphologic changes: Secondary | ICD-10-CM

## 2012-04-10 DIAGNOSIS — I10 Essential (primary) hypertension: Secondary | ICD-10-CM

## 2012-04-10 DIAGNOSIS — Z789 Other specified health status: Secondary | ICD-10-CM

## 2012-04-10 DIAGNOSIS — E1142 Type 2 diabetes mellitus with diabetic polyneuropathy: Secondary | ICD-10-CM

## 2012-04-10 DIAGNOSIS — I872 Venous insufficiency (chronic) (peripheral): Secondary | ICD-10-CM

## 2012-04-10 DIAGNOSIS — N184 Chronic kidney disease, stage 4 (severe): Secondary | ICD-10-CM

## 2012-04-10 DIAGNOSIS — I878 Other specified disorders of veins: Secondary | ICD-10-CM

## 2012-04-10 DIAGNOSIS — E785 Hyperlipidemia, unspecified: Secondary | ICD-10-CM

## 2012-04-10 DIAGNOSIS — Z9189 Other specified personal risk factors, not elsewhere classified: Secondary | ICD-10-CM

## 2012-04-10 DIAGNOSIS — E1140 Type 2 diabetes mellitus with diabetic neuropathy, unspecified: Secondary | ICD-10-CM

## 2012-04-10 DIAGNOSIS — E1129 Type 2 diabetes mellitus with other diabetic kidney complication: Secondary | ICD-10-CM

## 2012-04-10 DIAGNOSIS — E1149 Type 2 diabetes mellitus with other diabetic neurological complication: Secondary | ICD-10-CM

## 2012-04-10 NOTE — Progress Notes (Signed)
Chief Complaint  Patient presents with  . Follow-up    HPI:  Follow up:  DM: Lab Results  Component Value Date   HGBA1C 9.2* 03/10/2012  medications: lantus solostar 50 units bid, novolog flexpen 10 units before breakfast and before dinner Lisinopril Lab Results  Component Value Date   CREATININE 1.8* 03/10/2012  Home blood sugars: fasting 75-116, not doing postprandial checks Lifestyle: gym 3-5 x per week (walks 1 mile), working on diet Feelings about regimen Eye exam/foot exam: 07/2011 per her report for eye exam, saw podiatrist and has follow up  HLD: Lab Results  Component Value Date   CHOL 140 03/10/2012   HDL 49.10 03/10/2012   LDLCALC 80 03/10/2012   TRIG 53.0 03/10/2012   CHOLHDL 3 03/10/2012  medications: lipitor 20mg  daily  HTN: -on BB, acei -stable  Venous stasis: -on lasix, advised to use compression stockings  ROS: See pertinent positives and negatives per HPI.  Past Medical History  Diagnosis Date  . Diabetes mellitus   . Hypertension   . Obesity   . Hyperlipidemia   . Peripheral arterial disease? Eval by vascular 2013 with no LE PAD and advised to stop pletal   . Diabetes mellitus with renal manifestations, uncontrolled 01/09/2012  . Diabetic neuropathy 01/09/2012  . Venous stasis of lower extremity 03/10/2012  . Diastolic heart failure - stage 1 per review of echo results from 02/2010 01/15/2012  . At risk for heart disease - +stres test 2012 followed by [facility], [place] with neg cardiac cath 03/2010 (non-obstructive CAD) per review of records, echo 02/2010 normal LVF 01/15/2012    Family History  Problem Relation Age of Onset  . Obesity Other   . Hypertension Other   . Hyperlipidemia Other   . Stroke Other   . Heart attack Other   . Diabetes Mother   . Hyperlipidemia Mother   . Hypertension Mother   . Heart attack Mother   . Stroke Mother   . Diabetes Sister   . Hypertension Brother   . Diabetes Sister   . Diabetes Sister   .  Diabetes Sister     History   Social History  . Marital Status: Married    Spouse Name: N/A    Number of Children: N/A  . Years of Education: N/A   Social History Main Topics  . Smoking status: Former Smoker    Types: Cigarettes    Quit date: 02/20/1999  . Smokeless tobacco: Never Used  . Alcohol Use: Yes  . Drug Use: Yes  . Sexually Active: None   Other Topics Concern  . None   Social History Narrative  . None    Current outpatient prescriptions:atorvastatin (LIPITOR) 20 MG tablet, TAKE 1 TABLET BY MOUTH EVERY DAY, Disp: 30 tablet, Rfl: 11;  fluocinonide cream (LIDEX) AB-123456789 %, Apply 1 application topically 2 (two) times daily. , Disp: , Rfl: ;  furosemide (LASIX) 40 MG tablet, TAKE 1 TABLET BY MOUTH TWICE A DAY FOR 30 DAYS, Disp: 60 tablet, Rfl: 5;  Insulin Pen Needle (B-D ULTRAFINE III SHORT PEN) 31G X 8 MM MISC, Use as directed., Disp: 100 each, Rfl: 5 LANTUS SOLOSTAR 100 UNIT/ML injection, Inject 50 Units into the skin 2 (two) times daily. , Disp: , Rfl: ;  lisinopril (PRINIVIL,ZESTRIL) 5 MG tablet, Take 5 mg by mouth daily. , Disp: , Rfl: ;  metoprolol succinate (TOPROL-XL) 25 MG 24 hr tablet, TAKE 1 TABLET BY MOUTH EVERY DAY, Disp: 30 tablet, Rfl: 11  NOVOLOG FLEXPEN 100 UNIT/ML injection, Inject 8-10 Units into the skin 3 (three) times daily before meals. Only uses if over 150   - Depends on sugar reading, Disp: , Rfl: ;  Vitamin D, Ergocalciferol, (DRISDOL) 50000 UNITS CAPS, 1 Units. , Disp: , Rfl:   EXAM:  Filed Vitals:   04/10/12 0844  BP: 130/80  Pulse: 84  Temp: 97.8 F (36.6 C)    Body mass index is 40.02 kg/(m^2).  GENERAL: vitals reviewed and listed above, alert, oriented, appears well hydrated and in no acute distress  HEENT: atraumatic, conjunttiva clear, no obvious abnormalities on inspection of external nose and ears  NECK: no obvious masses on inspection  LUNGS: clear to auscultation bilaterally, no wheezes, rales or rhonchi, good air movement  CV:  HRRR, no peripheral edema  MS: moves all extremities without noticeable abnormality  PSYCH: pleasant and cooperative, no obvious depression or anxiety  ASSESSMENT AND PLAN:  Discussed the following assessment and plan:  Hyperlipidemia  Diabetes mellitus with renal manifestations, uncontrolled  Diabetic neuropathy  Hypertension  Chronic kidney disease (CKD), stage IV (severe)  Venous stasis of lower extremity  At risk for heart disease - +stres test 2012 followed by [facility], [place] with neg cardiac cath 03/2010 (non-obstructive CAD) per review of records, echo 02/2010 normal LVF -finally, we know what medications she is taking - her knowledge of disease processes and her medications is improving -renal function is poor, so can not use metformin -offered increasing insulin, adding another oral agent like januvia, adding prandin before meals if is not liking all of the injections - she doesn't want to do any more oral meds - she wants to discuss insulin with the endocrinologist -pt has appt with endocrine - and she will monitor postprandial blood sugars so that mealtime insulin can be adjusted at that appointment -referred to podiatry last visit -continued support for lifestyle changes  -Patient advised to return or notify a doctor immediately if symptoms worsen or persist or new concerns arise. -Health maintenance: needs health maintenance exam to discuss colon cancer screening and do physical and pap --> advised to schedule  Patient Instructions  -check blood sugars:  -FASTING (first thing in the morning before eating)  -POSTPRANDIAL (2 hours after meals)  -Keep all of these value in the log book and bring this to every appointment  -continue lantus, use novolog with meals - 5 units if small meal, 8-10 units if large meal  Take blood sugar log to appointment with endocrinologist to help adjust mealtime insulin  Keep working on exercise and diet  Cerave Cream on  both legs daily  Follow up with me in about 2-3 months for you physical and labs      [place], [place]

## 2012-04-10 NOTE — Patient Instructions (Addendum)
-  check blood sugars:  -FASTING (first thing in the morning before eating)  -POSTPRANDIAL (2 hours after meals)  -Keep all of these value in the log book and bring this to every appointment  -continue lantus, use novolog with meals - 5 units if small meal, 8-10 units if large meal  Take blood sugar log to appointment with endocrinologist to help adjust mealtime insulin  Keep working on exercise and diet  Cerave Cream on both legs daily  Follow up with me in about 2-3 months for you physical and labs

## 2012-04-15 ENCOUNTER — Ambulatory Visit: Payer: BC Managed Care – PPO | Admitting: Internal Medicine

## 2012-04-17 ENCOUNTER — Ambulatory Visit (INDEPENDENT_AMBULATORY_CARE_PROVIDER_SITE_OTHER): Payer: BC Managed Care – PPO | Admitting: Internal Medicine

## 2012-04-17 ENCOUNTER — Encounter: Payer: Self-pay | Admitting: Internal Medicine

## 2012-04-17 VITALS — BP 118/74 | HR 80 | Ht 70.0 in | Wt 285.0 lb

## 2012-04-17 DIAGNOSIS — N058 Unspecified nephritic syndrome with other morphologic changes: Secondary | ICD-10-CM

## 2012-04-17 DIAGNOSIS — E1129 Type 2 diabetes mellitus with other diabetic kidney complication: Secondary | ICD-10-CM

## 2012-04-17 DIAGNOSIS — E1165 Type 2 diabetes mellitus with hyperglycemia: Secondary | ICD-10-CM

## 2012-04-17 NOTE — Patient Instructions (Addendum)
Please return in 1 month with your sugar log. Please decrease the Lantus to 45 units twice a day. Please take: 14 units of Novolog with breakfast, and 18 units of Novolog with dinner.  Patient instructions for Diabetes mellitus type 2:  DIET AND EXERCISE Diet and exercise is an important part of diabetic treatment.  We recommended aerobic exercise in the form of brisk walking (working between 40-60% of maximal aerobic capacity, similar to brisk walking) for 150 minutes per week (such as 30 minutes five days per week) along with 3 times per week performing 'resistance' training (using various gauge rubber tubes with handles) 5-10 exercises involving the major muscle groups (upper body, lower body and core) performing 10-15 repetitions (or near fatigue) each exercise. Start at half the above goal but build slowly to reach the above goals. If limited by weight, joint pain, or disability, we recommend daily walking in a swimming pool with water up to waist to reduce pressure from joints while allow for adequate exercise.    BLOOD GLUCOSES Monitoring your blood glucoses is important for continued management of your diabetes. Please check your blood glucoses 3-4 times a day: fasting, before meals and at bedtime (you can rotate these measurements - e.g. one day check before the 3 meals, the next day check before 2 of the meals and before bedtime, etc.   HYPOGLYCEMIA (low blood sugar) Hypoglycemia is usually a reaction to not eating, exercising, or taking too much insulin/ other diabetes drugs.  Symptoms include tremors, sweating, hunger, confusion, headache, etc. Treat IMMEDIATELY with 15 grams of Carbs:   4 glucose tablets    cup regular juice/soda   2 tablespoons raisins   4 teaspoons sugar   1 tablespoon honey Recheck blood glucose in 15 mins and repeat above if still symptomatic/blood glucose <100. Please contact our office at [phone] if you have questions about how to next handle your  insulin.  RECOMMENDATIONS TO REDUCE YOUR RISK OF DIABETIC COMPLICATIONS: * Take your prescribed MEDICATION(S). * Follow a DIABETIC diet: Complex carbs, fiber rich foods, heart healthy fish twice weekly, (monounsaturated and polyunsaturated) fats * AVOID saturated/trans fats, high fat foods, >2,300 mg salt per day. * EXERCISE at least 5 times a week for 30 minutes or preferably daily.  * DO NOT SMOKE OR DRINK more than 1 drink a day. * Check your FEET every day. Do not wear tightfitting shoes. Contact us if you develop an ulcer * See your EYE doctor once a year or more if needed * Get a FLU shot once a year * Get a PNEUMONIA vaccine every 5 years and once after age 57 years  GOALS:  * Your Hemoglobin A1c of 7%  * Your Systolic BP should be [id number] or lower  * Your Diastolic BP should be 80 or lower  * Your HDL (Good Cholesterol) should be 40 or higher  * Your LDL (Bad Cholesterol) should be 100 or lower  * Your Triglycerides should be 150 or lower  * Your Urine microalbumin (kidney function) of <30 * Your Body Mass Index should be 25 or lower  We will be glad to help you achieve these goals. Our telephone number is: [phone].

## 2012-04-17 NOTE — Progress Notes (Signed)
Subjective:     Patient ID: Savannah Crawford, female   DOB: 29-May-1955, 57 y.o.   MRN: [id number]  HPI [name] is a 57 y/o woman referred by her PCP, Dr. Maudie Mercury, for management of DM2, insulin-dependent, uncontrolled, with complications (CAD, CKD, diabetes neuropathy, diabetic retinopathy, PVD, amputated toe-R).  Pt has had DM for a long time, last hemoglobin A1c was 9.2% on 03/10/2012, previously 9.9%, 4 months ago. At the last visit with her PCP, she was mentioning that her sugars are between 75 and 116, but she was not checking postprandial sugars. She is on a regimen of  - Lantus 50 units twice a day - NovoLog 10-14 units 2 times a day with b'fast and dinner She was on metformin in the past, but stopped due to chronic kidney disease.   She checks her sugars 2-4 x a day. She brings her log, which I reviewed. Lowest: 71 x 2 in last 2 weeks, once in am and once before dinner. She tells me that if she does not eat a larger meal or if she keeps more active, her sugars drop before next meal. She has hypoglycemia awareness. Highest sugar 255, not in last 2 weeks.   She has 2 meals a day: - b'fast: egg, bacon, grits, stays away from bread - lunch: apple/orange, goes to gym or walks - dinner: vegetables, meat: chicken, pork chop, steak +/- starch  She has seen podiatry recently. She is also seen by nephrology for her chronic kidney disease, her last GFR was 37, BUN 29, creatinine 1.8. Her cholesterol levels are at goal, 140/53/49/80. She is going to the gym regularly, and trying to improve her diet. Has DR, last eye exam in June 2013.  She denies blurry vision, increased thirst, or urination. Has numbness and tingling in feet.   She has FH of DM.  She also has a history of nonobstructive coronary artery disease, with the positive stress test but a negative cardiac cath 2012, and also has diastolic heart failure. She has hypertension, hyperlipidemia, venous stasis, vitamin D deficiency.  Review of  Systems Constitutional: no weight gain/loss, no fatigue, no subjective hyperthermia/hypothermia Eyes: no blurry vision, no xerophthalmia ENT: no sore throat, no nodules palpated in throat, no dysphagia/odynophagia, no hoarseness Cardiovascular: no CP/SOB/palpitations/leg swelling Respiratory: no cough/SOB Gastrointestinal: no N/V/D/C Musculoskeletal: no muscle/joint aches Skin: no rashes Neurological: no tremors/numbness/tingling/dizziness Psychiatric: no depression/anxiety  Past Medical History  Diagnosis Date  . Diabetes mellitus   . Hypertension   . Obesity   . Hyperlipidemia   . Peripheral arterial disease? Eval by vascular 2013 with no LE PAD and advised to stop pletal   . Diabetes mellitus with renal manifestations, uncontrolled 01/09/2012  . Diabetic neuropathy 01/09/2012  . Venous stasis of lower extremity 03/10/2012  . Diastolic heart failure - stage 1 per review of echo results from 02/2010 01/15/2012  . At risk for heart disease - +stres test 2012 followed by [facility], [place] with neg cardiac cath 03/2010 (non-obstructive CAD) per review of records, echo 02/2010 normal LVF 01/15/2012   Past Surgical History  Procedure Laterality Date  . Toe amputation  2008  . Eye surgery     History   Social History  . Marital Status: Married    Number of Children: 1   Occupational History  . Customer svc clerk   Social History Main Topics  . Smoking status: Former Smoker    Types: Cigarettes    Quit date: 02/20/1999  . Smokeless  tobacco: Never Used  . Alcohol Use: Yes  . Drug Use: No   Social History Narrative  . No narrative on file   Current Outpatient Prescriptions on File Prior to Visit  Medication Sig Dispense Refill  . atorvastatin (LIPITOR) 20 MG tablet TAKE 1 TABLET BY MOUTH EVERY DAY  30 tablet  11  . fluocinonide cream (LIDEX) AB-123456789 % Apply 1 application topically 2 (two) times daily.       . furosemide (LASIX) 40 MG tablet TAKE 1 TABLET BY MOUTH  TWICE A DAY FOR 30 DAYS  60 tablet  5  . Insulin Pen Needle (B-D ULTRAFINE III SHORT PEN) 31G X 8 MM MISC Use as directed.  100 each  5  . LANTUS SOLOSTAR 100 UNIT/ML injection Inject 50 Units into the skin 2 (two) times daily.       Marland Kitchen lisinopril (PRINIVIL,ZESTRIL) 5 MG tablet Take 5 mg by mouth daily.       . metoprolol succinate (TOPROL-XL) 25 MG 24 hr tablet TAKE 1 TABLET BY MOUTH EVERY DAY  30 tablet  11  . NOVOLOG FLEXPEN 100 UNIT/ML injection Inject 8-10 Units into the skin 3 (three) times daily before meals. Only uses if over 150   - Depends on sugar reading      . Vitamin D, Ergocalciferol, (DRISDOL) 50000 UNITS CAPS 1 Units.        No current facility-administered medications on file prior to visit.   No Known Allergies  Family History  Problem Relation Age of Onset  . Obesity Other   . Hypertension Other   . Hyperlipidemia Other   . Stroke Other   . Heart attack Other   . Diabetes Mother   . Hyperlipidemia Mother   . Hypertension Mother   . Heart attack Mother   . Stroke Mother   . Diabetes Sister   . Hypertension Brother   . Diabetes Sister   . Diabetes Sister   . Diabetes Sister    Objective:   Physical Exam BP 118/74  Pulse 80  Ht 5\' 10"  (1.778 m)  Wt 285 lb (129.275 kg)  BMI 40.89 kg/m2  SpO2 97% Wt Readings from Last 3 Encounters:  04/17/12 285 lb (129.275 kg)  04/10/12 283 lb (128.368 kg)  03/10/12 279 lb (126.554 kg)   Constitutional: overweight, in NAD Eyes: PERRLA, EOMI, no exophthalmos ENT: moist mucous membranes, no thyromegaly, no cervical lymphadenopathy Cardiovascular: RRR, No MRG Respiratory: CTA B Gastrointestinal: abdomen soft, NT, ND, BS+ Musculoskeletal: no deformities, strength intact in all 4 Skin: moist, warm, no rashes Neurological: no tremor with outstretched hands, DTR normal in all 4  Assessment:     1. DM2, insulin-dependent, uncontrolled, with complications - nonobstructive CAD - CKD - diabetes neuropathy - diabetic  retinopathy - PVD - R amputated toe  Plan:  The patient has long-standing diabetes mellitus, insulin-dependent, with fluctuating blood sugars between 70 and 200, with distinctive feature of dropping her sugars if she doesn't eat enough or exercising more. This is usually a sign of excess basal insulin. Indeed, her total basal insulin daily dose (100 units) is much larger then the total prandial insulin daily dose (20-28 units). This is because she was recently started on NovoLog. - We discussed about trying to get her basal and mealtime insulin doses approximately the same for a more physiologic regimen. I will therefore decrease her Lantus to 45 units twice a day, and maintain her NovoLog dose at 14 units with breakfast, while increasing the  dinnertime NovoLog to 18 units. I encouraged her to increase these doses if her sugars remain high or increasing throughout the day. - she was taking before and after meal CBGs, however her postprandial measurements are approximately 20 minutes after her meal, which is too soon. I advised her to occasionally check postprandials, but these should be 1.5-2 hours after the meal - I discussed with her about the possibility of using carb counting for better estimation of her mealtime insulin, however she would to not want to do this for now, but base her NovoLog dose on the size of her meals. She saw nutrition in the past and got trained in carb counting, but she is not doing this now - I gave her a brochure about carb counting and advised her to read it so that she better estimates the caloric intake from her meals - advised how to fill her sugar log and to advised her bring it at next appt - given foot care handout and explained the principles - given instructions for hypoglycemia management "15-15 rule" - no labs for today - I encouraged her to join my chart - I will see her back in 3-4 weeks with her log

## 2012-04-25 ENCOUNTER — Other Ambulatory Visit: Payer: Self-pay | Admitting: Family Medicine

## 2012-05-15 ENCOUNTER — Encounter: Payer: Self-pay | Admitting: Internal Medicine

## 2012-05-15 ENCOUNTER — Ambulatory Visit (INDEPENDENT_AMBULATORY_CARE_PROVIDER_SITE_OTHER): Payer: BC Managed Care – PPO | Admitting: Internal Medicine

## 2012-05-15 VITALS — BP 120/66 | HR 76 | Temp 98.1°F | Resp 12 | Wt 283.5 lb

## 2012-05-15 DIAGNOSIS — N058 Unspecified nephritic syndrome with other morphologic changes: Secondary | ICD-10-CM

## 2012-05-15 DIAGNOSIS — E1129 Type 2 diabetes mellitus with other diabetic kidney complication: Secondary | ICD-10-CM

## 2012-05-15 NOTE — Patient Instructions (Signed)
Please return in 2-3 months, with your sugar log. KEEP UP THE GOOD WORK!

## 2012-05-15 NOTE — Progress Notes (Signed)
Subjective:     Patient ID: Savannah Crawford, female   DOB: 1956/01/31, 57 y.o.   MRN: [id number]  HPI [name] is a 57 y/o woman referred by her PCP, Dr. Maudie Mercury, for management of DM2, insulin-dependent, uncontrolled, with complications (CAD, CKD, diabetes neuropathy, diabetic retinopathy, PVD, amputated toe-R).  Pt has had DM for a long time, last hemoglobin A1c was 9.2% on 03/10/2012, previously 9.9%, 4 months ago. She is on a regimen of: - Lantus 45 units twice a day, decreased from 50 units bid at last visit - NovoLog 14 and 18 units 2 times a day with b'fast and dinner, respectively She was on metformin in the past, but stopped due to chronic kidney disease.   She checks her sugars 2-3 x a day. She brings her log, which I reviewed. Lowest: 74 x 1 since last time, had one 80. She has hypoglycemia awareness. Highest sugar 207. She had 2 instances in the last month when she forgot to take her insulin, and her sugars increase to 160-200's. - am: 104-167 - prelunch: 105 (one value, it is difficult for her to check since she is at work) - predinner: 87-195 (had one 74 value)  She sees podiatry. She is also seen by nephrology for her chronic kidney disease, her last GFR was 37, BUN 29, creatinine 1.8. Her cholesterol levels are at goal, 140/53/49/80. She is going to the gym regularly (walks for 30 min >3/7 days a week), and trying to improve her diet. Has DR, last eye exam in June 2013.  She denies blurry vision, increased thirst, or urination. Has numbness and tingling in feet.   She also has a history of nonobstructive coronary artery disease, with the positive stress test but a negative cardiac cath 2012, and also has diastolic heart failure. She has hypertension, hyperlipidemia, venous stasis, vitamin D deficiency.  I reviewed pt's medications, allergies, PMH, social hx, family hx and no changes required. She will see Dr. Maudie Mercury next month.  Review of Systems Constitutional: no weight gain/loss, no  fatigue, no subjective hyperthermia/hypothermia Eyes: no blurry vision, no xerophthalmia ENT: no sore throat, no nodules palpated in throat, no dysphagia/odynophagia, no hoarseness Cardiovascular: no CP/SOB/palpitations/leg swelling Respiratory: no cough/SOB Gastrointestinal: no N/V/D/C Musculoskeletal: no muscle/joint aches Skin: no rashes Neurological: no tremors/numbness/tingling/dizziness Psychiatric: no depression/anxiety     Objective:   Physical Exam BP 120/66  Pulse 76  Temp(Src) 98.1 F (36.7 C) (Oral)  Resp 12  Wt 283 lb 8 oz (128.595 kg)  BMI 40.68 kg/m2  SpO2 96% Wt Readings from Last 3 Encounters:  05/15/12 283 lb 8 oz (128.595 kg)  04/17/12 285 lb (129.275 kg)  04/10/12 283 lb (128.368 kg)   Constitutional: overweight, in NAD Eyes: PERRLA, EOMI, no exophthalmos ENT: moist mucous membranes, no thyromegaly, no cervical lymphadenopathy Cardiovascular: RRR, No MRG Respiratory: CTA B Gastrointestinal: abdomen soft, NT, ND, BS+ Musculoskeletal: no deformities, strength intact in all 4 Skin: moist, warm, no rashes Neurological: no tremor with outstretched hands, DTR normal in all 4     Assessment:     1. DM2, insulin-dependent, uncontrolled, with complications - nonobstructive CAD - CKD - diabetes neuropathy - diabetic retinopathy - PVD - R amputated toe    Plan:     Pt with improved diabetes control in the last month, despite a dysbalance between her total daily dose of Lantus and NovoLog. She continues to try to improve her diet, and also she exercises at least 3 times a day at the gym.  Her maximum and minimum CBGs have improved. She is not checking as much before lunch, but she does have plenty of values in her log before breakfast and dinner. These are almost at target. - I advised her to stay on the current doses of Lantus and NovoLog, since she is doing this well - I advised her to try to check before and after a meal, to try to adjust her NovoLog  doses at next visit - Based on her sugars, I estimate that her HbA1C will be 7% at next check, if we are not missing highs, which I doubt - RTC in 2-3 months

## 2012-07-01 ENCOUNTER — Telehealth: Payer: Self-pay | Admitting: Internal Medicine

## 2012-07-01 NOTE — Telephone Encounter (Signed)
Received 8 pages from [place] to Dr. Cruzita Lederer. 07/01/12/ss

## 2012-07-08 ENCOUNTER — Encounter: Payer: Self-pay | Admitting: Family Medicine

## 2012-07-08 ENCOUNTER — Other Ambulatory Visit (HOSPITAL_COMMUNITY)
Admission: RE | Admit: 2012-07-08 | Discharge: 2012-07-08 | Disposition: A | Discharge status: Home / Self Care | Payer: BC Managed Care – PPO | Source: Ambulatory Visit | Attending: Family Medicine | Admitting: Family Medicine

## 2012-07-08 ENCOUNTER — Ambulatory Visit (INDEPENDENT_AMBULATORY_CARE_PROVIDER_SITE_OTHER): Payer: BC Managed Care – PPO | Admitting: Family Medicine

## 2012-07-08 VITALS — BP 110/78 | Temp 98.1°F | Wt 289.0 lb

## 2012-07-08 DIAGNOSIS — N184 Chronic kidney disease, stage 4 (severe): Secondary | ICD-10-CM

## 2012-07-08 DIAGNOSIS — Z0000 Encounter for general adult medical examination without abnormal findings: Secondary | ICD-10-CM

## 2012-07-08 DIAGNOSIS — I10 Essential (primary) hypertension: Secondary | ICD-10-CM

## 2012-07-08 DIAGNOSIS — N058 Unspecified nephritic syndrome with other morphologic changes: Secondary | ICD-10-CM

## 2012-07-08 DIAGNOSIS — Z1151 Encounter for screening for human papillomavirus (HPV): Secondary | ICD-10-CM | POA: Insufficient documentation

## 2012-07-08 DIAGNOSIS — E1129 Type 2 diabetes mellitus with other diabetic kidney complication: Secondary | ICD-10-CM

## 2012-07-08 DIAGNOSIS — E1140 Type 2 diabetes mellitus with diabetic neuropathy, unspecified: Secondary | ICD-10-CM

## 2012-07-08 DIAGNOSIS — I5030 Unspecified diastolic (congestive) heart failure: Secondary | ICD-10-CM

## 2012-07-08 DIAGNOSIS — E1165 Type 2 diabetes mellitus with hyperglycemia: Secondary | ICD-10-CM

## 2012-07-08 DIAGNOSIS — Z01419 Encounter for gynecological examination (general) (routine) without abnormal findings: Secondary | ICD-10-CM | POA: Insufficient documentation

## 2012-07-08 DIAGNOSIS — E1149 Type 2 diabetes mellitus with other diabetic neurological complication: Secondary | ICD-10-CM

## 2012-07-08 DIAGNOSIS — E785 Hyperlipidemia, unspecified: Secondary | ICD-10-CM

## 2012-07-08 DIAGNOSIS — E1142 Type 2 diabetes mellitus with diabetic polyneuropathy: Secondary | ICD-10-CM

## 2012-07-08 LAB — BASIC METABOLIC PANEL
Chloride: 102 mEq/L (ref 96–112)
GFR: 40.72 mL/min — ABNORMAL LOW (ref >60.00)
Glucose, Bld: 133 mg/dL — ABNORMAL HIGH (ref 70–99)
Potassium: 3.9 mEq/L (ref 3.5–5.1)
Sodium: 137 mEq/L (ref 135–145)

## 2012-07-08 LAB — MICROALBUMIN / CREATININE URINE RATIO
Creatinine,U: 15.7 mg/dL
Microalb Creat Ratio: 3.2 mg/g (ref 0.0–30.0)

## 2012-07-08 LAB — LIPID PANEL
Cholesterol: 128 mg/dL (ref 0–200)
VLDL: 17.6 mg/dL (ref 0.0–40.0)

## 2012-07-08 NOTE — Progress Notes (Signed)
Quick Note:  Left a message for pt to return call. ______ 

## 2012-07-08 NOTE — Patient Instructions (Signed)
-  schedule your mammogram  -please call and have the results from your colonoscopy faxed to our office  We recommend the following healthy lifestyle measures: - eat a healthy diet consisting of lots of vegetables, fruits, beans, nuts, seeds, healthy meats such as white chicken and fish and whole grains.  - avoid fried foods, fast food, processed foods, sodas, red meet and other fattening foods.  - get a least 150 minutes of aerobic exercise per week.   Follow up in 3-4 months

## 2012-07-08 NOTE — Progress Notes (Signed)
Chief Complaint  Patient presents with  . Annual Exam    HPI:  Here for CPE:  1, 2, 3)DM with renal complications/HLD/HTN: -followed by endocrine, nephrology, optho and podiatry -improved glucose control -wants to recheck labs today -denies: skin lesions, polyuria, CP, SOB, DOE, vision changes  -Concerns today: none  -Diet: she has been working on improving diet, cutting down on eggs and bacon, cutting back on portion sizes, healthier choices  -Exercise: no regular exercise recently  -Does not take calcium - takes vitamin D once per month  -Diabetes and Dyslipidemia Screening: see history, testing today  -Hx of HTN: reated  -Vaccines: UTD, reports she had pneumovax as an adult too  -pap history: 2010, normal  -FDLMP: postmenopausal, no vaginal bleeding  -sexual activity: no  -wants STI testing: no  -FH breast, colon or ovarian ca: see FH -last mammo 02/2011 -reports had in [place] in June 2013 and reports normal and told repeat in 10 years  -Alcohol, Tobacco, drug use: see social history  Review of Systems - denies: CP, SOB, vision changes, changes in bowels, bleeding, dysuria, vaginal discharge, rash, depression, syncope  Past Medical History  Diagnosis Date  . Diabetes mellitus   . Hypertension   . Obesity   . Hyperlipidemia   . Peripheral arterial disease? Eval by vascular 2013 with no LE PAD and advised to stop pletal   . Diabetes mellitus with renal manifestations, uncontrolled 01/09/2012  . Diabetic neuropathy 01/09/2012  . Venous stasis of lower extremity 03/10/2012  . Diastolic heart failure - stage 1 per review of echo results from 02/2010 01/15/2012  . At risk for heart disease - +stres test 2012 followed by [facility], [place] with neg cardiac cath 03/2010 (non-obstructive CAD) per review of records, echo 02/2010 normal LVF 01/15/2012    Family History  Problem Relation Age of Onset  . Obesity Other   . Hypertension Other   . Hyperlipidemia  Other   . Stroke Other   . Heart attack Other   . Diabetes Mother   . Hyperlipidemia Mother   . Hypertension Mother   . Heart attack Mother   . Stroke Mother   . Diabetes Sister   . Hypertension Brother   . Diabetes Sister   . Diabetes Sister   . Diabetes Sister     History   Social History  . Marital Status: Married    Spouse Name: N/A    Number of Children: N/A  . Years of Education: N/A   Social History Main Topics  . Smoking status: Former Smoker    Types: Cigarettes    Quit date: 02/20/1999  . Smokeless tobacco: Never Used  . Alcohol Use: Yes  . Drug Use: Yes  . Sexually Active: None   Other Topics Concern  . None   Social History Narrative  . None    Current outpatient prescriptions:atorvastatin (LIPITOR) 20 MG tablet, TAKE 1 TABLET BY MOUTH EVERY DAY, Disp: 30 tablet, Rfl: 11;  fluocinonide cream (LIDEX) [id number] %, Apply 1 application topically 2 (two) times daily. , Disp: , Rfl: ;  furosemide (LASIX) 40 MG tablet, TAKE 1 TABLET BY MOUTH TWICE A DAY FOR 30 DAYS, Disp: 60 tablet, Rfl: 5;  glucose blood (FREESTYLE LITE) test strip, 1 each by Other route as needed. , Disp: , Rfl:  Insulin Glargine (LANTUS SOLOSTAR) 100 UNIT/ML SOPN, , Disp: , Rfl: ;  Insulin Pen Needle (B-D ULTRAFINE III SHORT PEN) 31G X 8 MM MISC, Use as  directed., Disp: 100 each, Rfl: 5;  lisinopril (PRINIVIL,ZESTRIL) 5 MG tablet, Take 5 mg by mouth daily. , Disp: , Rfl: ;  NOVOLOG FLEXPEN 100 UNIT/ML injection, Inject 8-10 Units into the skin 2 (two) times daily before a meal. Only uses if over 150   - Depends on sugar reading, Disp: , Rfl:  Vitamin D, Ergocalciferol, (DRISDOL) 50000 UNITS CAPS, 1 Units. , Disp: , Rfl: ;  metoprolol succinate (TOPROL-XL) 25 MG 24 hr tablet, TAKE 1 TABLET BY MOUTH EVERY DAY, Disp: 30 tablet, Rfl: 11  EXAM:  Filed Vitals:   07/08/12 0812  BP: 110/78  Temp: 98.1 F (36.7 C)    GENERAL: vitals reviewed and listed below, alert, oriented, appears well hydrated and in  no acute distress  HEENT: head atraumatic, PERRLA, normal appearance of eyes, ears, nose and mouth. moist mucus membranes.  NECK: supple, no masses or lymphadenopathy  LUNGS: clear to auscultation bilaterally, no rales, rhonchi or wheeze  CV: HRRR, no peripheral edema or cyanosis, normal pedal pulses  BREAST: normal appearance - no lesions or discharge, on palpation normal breast tissue without any suspicious masses  ABDOMEN: bowel sounds normal, soft, non tender to palpation, no masses, no rebound or guarding  GU: normal appearance of external genitalia - no lesions or masses, normal vaginal mucosa - no abnormal discharge, normal appearance of cervix - no lesions or abnormal discharge, no masses or tenderness on palpation of uterus and ovaries.  RECTAL: refused  SKIN: no rash or abnormal lesions  MS: normal gait, moves all extremities normally  NEURO: CN II-XII grossly intact, normal muscle strength and sensation to light touch on extremities  PSYCH: normal affect, pleasant and cooperative  ASSESSMENT AND PLAN:  Discussed the following assessment and plan:  Routine general medical examination at a health care facility - Plan: Cytology - PAP  Diabetes mellitus with renal manifestations, uncontrolled - Plan: Hemoglobin A1c, Microalbumin/Creatinine Ratio, Urine  Hyperlipidemia - Plan: Lipid Panel  Diabetic neuropathy  Hypertension - Plan: Basic metabolic panel  Chronic kidney disease (CKD), stage IV (severe)  Diastolic heart failure - stage 1 per review of echo results from 02/2010    -Discussed and advised all [place] preventive services health task force level A and B recommendations for age, sex and risks.  -Advised at least 150 minutes of exercise per week and a healthy diet low in saturated fats and sweets and consisting of fresh fruits and vegetables, lean meats such as fish and white chicken and whole grains.  -labs, studies and vaccines per orders this  encounter  -will check FASTING LABS today for DM, HLD, HTN, continue current medication in the meantime, stressed importance of healthy diet and adding back regular exercise  -follow up in 3-4 months  Orders Placed This Encounter  Procedures  . Lipid Panel  . Basic metabolic panel  . Hemoglobin A1c  . Microalbumin/Creatinine Ratio, Urine    Patient Instructions  -schedule your mammogram  -please call and have the results from your colonoscopy faxed to our office  We recommend the following healthy lifestyle measures: - eat a healthy diet consisting of lots of vegetables, fruits, beans, nuts, seeds, healthy meats such as white chicken and fish and whole grains.  - avoid fried foods, fast food, processed foods, sodas, red meet and other fattening foods.  - get a least 150 minutes of aerobic exercise per week.   Follow up in 3-4 months    Patient advised to return to clinic immediately if symptoms worsen  or persist or new concerns.  @LIFEPLAN @  No Follow-up on file.  Colin Benton R.

## 2012-07-09 NOTE — Progress Notes (Signed)
Quick Note:  Called and spoke with pt and pt is aware. ______ 

## 2012-07-11 NOTE — Progress Notes (Signed)
Quick Note:  Called and spoke with pt and pt is aware. ______ 

## 2012-07-23 ENCOUNTER — Other Ambulatory Visit: Payer: Self-pay | Admitting: Family Medicine

## 2012-07-29 ENCOUNTER — Telehealth: Payer: Self-pay | Admitting: *Deleted

## 2012-07-29 ENCOUNTER — Other Ambulatory Visit: Payer: Self-pay

## 2012-07-29 ENCOUNTER — Telehealth: Payer: Self-pay

## 2012-07-29 MED ORDER — INSULIN LISPRO 100 UNIT/ML (KWIKPEN): PEN_INJECTOR | SUBCUTANEOUS | Status: DC

## 2012-07-29 NOTE — Telephone Encounter (Signed)
Savannah Crawford, can you please enter? Thanks so much!

## 2012-07-29 NOTE — Telephone Encounter (Signed)
Appears endocrine handles her insulin and call in to them to correct. Please forward to their office. Thanks.

## 2012-07-29 NOTE — Telephone Encounter (Signed)
Pt called stating her insurance will not pay for Novolog, but the will pay for Humalog. Pt would like to know if Dr Cruzita Lederer will change her rx to Humalog and send in an rx to her pharmacy, [facility] in [place]? (Pt's call back [phone]) Please advise. Thank you.

## 2012-07-29 NOTE — Telephone Encounter (Signed)
Received a fax from [facility] stating that Novolog flexpens are not covered any longer.  Requesting new rx for humalog.  Pls advise.

## 2012-07-29 NOTE — Telephone Encounter (Signed)
Done

## 2012-07-29 NOTE — Telephone Encounter (Signed)
Pls advise.  

## 2012-07-29 NOTE — Telephone Encounter (Signed)
Savannah Crawford, can you please enter Humalog instead? Thank you.

## 2012-08-14 ENCOUNTER — Ambulatory Visit: Payer: BC Managed Care – PPO | Admitting: Internal Medicine

## 2012-09-23 ENCOUNTER — Other Ambulatory Visit: Payer: Self-pay

## 2012-09-23 DIAGNOSIS — Z1231 Encounter for screening mammogram for malignant neoplasm of breast: Secondary | ICD-10-CM

## 2012-09-29 ENCOUNTER — Ambulatory Visit: Payer: BC Managed Care – PPO | Admitting: Internal Medicine

## 2012-10-01 ENCOUNTER — Telehealth: Payer: Self-pay

## 2012-10-01 NOTE — Telephone Encounter (Signed)
Received a fax from [facility] stating please have Dr. Maudie Mercury call Dr. Fritzi Mandes to discuss lab report and course of treatment.

## 2012-10-01 NOTE — Telephone Encounter (Signed)
Called number provided. LM for them to return our call.

## 2012-10-07 ENCOUNTER — Telehealth: Payer: Self-pay | Admitting: Family Medicine

## 2012-10-07 ENCOUNTER — Ambulatory Visit: Payer: BC Managed Care – PPO

## 2012-10-07 NOTE — Telephone Encounter (Signed)
Opened in error

## 2012-10-09 ENCOUNTER — Other Ambulatory Visit: Payer: Self-pay | Admitting: Internal Medicine

## 2012-10-13 ENCOUNTER — Encounter: Payer: Self-pay | Admitting: Internal Medicine

## 2012-10-13 ENCOUNTER — Ambulatory Visit (INDEPENDENT_AMBULATORY_CARE_PROVIDER_SITE_OTHER): Payer: BC Managed Care – PPO | Admitting: Internal Medicine

## 2012-10-13 ENCOUNTER — Ambulatory Visit
Admission: RE | Admit: 2012-10-13 | Discharge: 2012-10-13 | Disposition: A | Discharge status: Home / Self Care | Payer: BC Managed Care – PPO | Source: Ambulatory Visit

## 2012-10-13 VITALS — BP 118/62 | HR 85 | Temp 97.8°F | Resp 12 | Wt 287.0 lb

## 2012-10-13 DIAGNOSIS — N058 Unspecified nephritic syndrome with other morphologic changes: Secondary | ICD-10-CM

## 2012-10-13 DIAGNOSIS — E1129 Type 2 diabetes mellitus with other diabetic kidney complication: Secondary | ICD-10-CM

## 2012-10-13 DIAGNOSIS — Z1231 Encounter for screening mammogram for malignant neoplasm of breast: Secondary | ICD-10-CM

## 2012-10-13 LAB — HEMOGLOBIN A1C: Hgb A1c MFr Bld: 8.9 % — ABNORMAL HIGH (ref 4.6–6.5)

## 2012-10-13 MED ORDER — INSULIN LISPRO 100 UNIT/ML (KWIKPEN): PEN_INJECTOR | SUBCUTANEOUS | Status: DC

## 2012-10-13 MED ORDER — INSULIN GLARGINE 100 UNIT/ML SOLOSTAR PEN: PEN_INJECTOR | SUBCUTANEOUS | Status: DC

## 2012-10-13 NOTE — Progress Notes (Signed)
Subjective:     Patient ID: Savannah Crawford, female   DOB: November 17, 1955, 57 y.o.   MRN: [id number]  HPI Savannah Crawford is a 57 y/o woman returning for f/u for DM2, insulin-dependent, uncontrolled, with complications (CAD, CKD, diabetes neuropathy, diabetic retinopathy, PVD, amputated toe-R). Last visit 5 mo ago.  Pt has had DM for a long time, last hemoglobin A1c was: Lab Results  Component Value Date   HGBA1C 8.6* 07/08/2012  previously 9.2%, previously 9.9 %.   She is on a regimen of: - Lantus 45 units bid, decreased from 50 units bid at last visit - Humalog 14-16 units in am and 18 units before dinner - might inject after she eats, even 2h after She was on metformin in the past, but stopped due to chronic kidney disease.   She checks her sugars 2-3 x a day. She does not bring her log.  - am: 104-167 >> 110-157 - prelunch: 105 (one value, it is difficult for her to check since she is at work) >> 98-120 - predinner: 87-195 (had one 74 value) >> 100-180 - bedtime: 187-310 (highest when checked after eating - if forgets insulin - maybe once a week) Lowest: 74 x 1 since last time, had one 80. She has hypoglycemia awareness. Highest sugar 207. She had 2 instances in the last month when she forgot to take her insulin, and her sugars increase to 160-200's.  She is also seen by nephrology for her chronic kidney disease.  Lab Results  Component Value Date   BUN 21 07/08/2012   CREATININE 1.7* 07/08/2012  She is on Lisinopril 5.  Her cholesterol levels are at goal: Lab Results  Component Value Date   CHOL 128 07/08/2012   HDL 43.90 07/08/2012   LDLCALC 67 07/08/2012   TRIG 88.0 07/08/2012   CHOLHDL 3 07/08/2012  She is on Lipitor 20.  She is going to the gym regularly (walks for 30 min >3/7 days a week), and trying to improve her diet.  Has DR, last eye exam in 07/2011. She needs to make an appt soon.  She denies blurry vision, increased thirst, or urination. Has numbness and tingling in feet. She  sees podiatry.   She also has a history of nonobstructive CAD, with the positive stress test but a negative cardiac cath 2012, and also has diastolic heart failure. She has hypertension, hyperlipidemia, venous stasis, vitamin D deficiency.  I reviewed pt's medications, allergies, PMH, social hx, family hx and no changes required, except as above.  Review of Systems Constitutional: no weight gain/loss, + fatigue, no subjective hyperthermia/hypothermia Eyes: no blurry vision, no xerophthalmia ENT: no sore throat, no nodules palpated in throat, no dysphagia/odynophagia, no hoarseness Cardiovascular: no CP/SOB/palpitations/leg swelling Respiratory: no cough/SOB Gastrointestinal: no N/V/D/+C/ + GERD Musculoskeletal: no muscle/joint aches. Has a left foot boot - had a blister, now healed Skin: no rashes; + itching Neurological: no tremors/numbness/tingling/dizziness  Objective:   Physical Exam BP 118/62  Pulse 85  Temp(Src) 97.8 F (36.6 C) (Oral)  Resp 12  Wt 287 lb (130.182 kg)  BMI 41.18 kg/m2  SpO2 96% Wt Readings from Last 3 Encounters:  10/13/12 287 lb (130.182 kg)  07/08/12 289 lb (131.09 kg)  05/15/12 283 lb 8 oz (128.595 kg)   Constitutional: overweight, in NAD Eyes: PERRLA, EOMI, no exophthalmos ENT: moist mucous membranes, no thyromegaly, no cervical lymphadenopathy Cardiovascular: RRR, No MRG Respiratory: CTA B Gastrointestinal: abdomen soft, NT, ND, BS+ Musculoskeletal: no deformities, strength intact in all 4 Skin:  moist, warm, no rashes Neurological: no tremor with outstretched hands, DTR normal in all 4  Assessment:     1. DM2, insulin-dependent, uncontrolled, with complications - nonobstructive CAD - CKD - diabetes neuropathy - diabetic retinopathy - PVD - R amputated toe    Plan:     Pt with improved diabetes control, despite an imbalance between her total daily dose of Lantus and Humalog.  - I advised her to stay on the current doses of Lantus but  increased her Hu,malog with dinner if she has a large meal. She has some higher sugars but some are checked after she ate... - she can inject after a meal and I discussed with her why this is not a good practice. She mentions she has no lows, but even if this is the case, by injecting after a meal, she misses the postprandial CBG peak.  - we might need a SSI added at next visit - will check a HbA1C today - RTC in 3 months  Office Visit on 10/13/2012  Component Date Value Range Status  . Hemoglobin A1C 10/13/2012 8.9* 4.6 - 6.5 % Final   Glycemic Control Guidelines for People with Diabetes:Non Diabetic:  <6%Goal of Therapy: <7%Additional Action Suggested:  >8%    HbA1C a little worse - hope to improve by injecting insulin pre- rather than post-prandially

## 2012-10-13 NOTE — Patient Instructions (Signed)
Please continue the current Lantus dose. Continue Humalog 14-16 units in am. Continue Humalog 18 units with a regular dinner and 21 units with a large dinner. Take Humalog 15 min before a meal. Please stop at the lab.

## 2012-10-14 ENCOUNTER — Telehealth: Payer: Self-pay | Admitting: *Deleted

## 2012-10-14 NOTE — Telephone Encounter (Signed)
Called Savannah Crawford and told her that her A1c was up some. It was 8.6, 3 mos ago and it is 8.9 this time. Advised Savannah Crawford per Dr Cruzita Lederer to continue on the insulin regimen that was discussed at her appt. Savannah Crawford understood.

## 2012-11-13 ENCOUNTER — Encounter: Payer: Self-pay | Admitting: Family Medicine

## 2012-11-13 ENCOUNTER — Ambulatory Visit (INDEPENDENT_AMBULATORY_CARE_PROVIDER_SITE_OTHER): Payer: BC Managed Care – PPO | Admitting: Family Medicine

## 2012-11-13 VITALS — BP 120/78 | Temp 98.7°F | Wt 290.0 lb

## 2012-11-13 DIAGNOSIS — E1129 Type 2 diabetes mellitus with other diabetic kidney complication: Secondary | ICD-10-CM

## 2012-11-13 DIAGNOSIS — E1149 Type 2 diabetes mellitus with other diabetic neurological complication: Secondary | ICD-10-CM

## 2012-11-13 DIAGNOSIS — N058 Unspecified nephritic syndrome with other morphologic changes: Secondary | ICD-10-CM

## 2012-11-13 DIAGNOSIS — I878 Other specified disorders of veins: Secondary | ICD-10-CM

## 2012-11-13 DIAGNOSIS — E1140 Type 2 diabetes mellitus with diabetic neuropathy, unspecified: Secondary | ICD-10-CM

## 2012-11-13 DIAGNOSIS — E785 Hyperlipidemia, unspecified: Secondary | ICD-10-CM

## 2012-11-13 DIAGNOSIS — I5030 Unspecified diastolic (congestive) heart failure: Secondary | ICD-10-CM

## 2012-11-13 DIAGNOSIS — I10 Essential (primary) hypertension: Secondary | ICD-10-CM

## 2012-11-13 DIAGNOSIS — E1142 Type 2 diabetes mellitus with diabetic polyneuropathy: Secondary | ICD-10-CM

## 2012-11-13 DIAGNOSIS — I872 Venous insufficiency (chronic) (peripheral): Secondary | ICD-10-CM

## 2012-11-13 DIAGNOSIS — N184 Chronic kidney disease, stage 4 (severe): Secondary | ICD-10-CM

## 2012-11-13 DIAGNOSIS — B372 Candidiasis of skin and nail: Secondary | ICD-10-CM

## 2012-11-13 DIAGNOSIS — Z23 Encounter for immunization: Secondary | ICD-10-CM

## 2012-11-13 HISTORY — DX: Morbid (severe) obesity due to excess calories: E66.01

## 2012-11-13 LAB — BASIC METABOLIC PANEL
BUN: 21 mg/dL (ref 6–23)
Creatinine, Ser: 1.6 mg/dL — ABNORMAL HIGH (ref 0.4–1.2)
GFR: 43.04 mL/min — ABNORMAL LOW (ref >60.00)
Potassium: 4 mEq/L (ref 3.5–5.1)

## 2012-11-13 LAB — LIPID PANEL
Cholesterol: 133 mg/dL (ref 0–200)
LDL Cholesterol: 63 mg/dL (ref 0–99)
Total CHOL/HDL Ratio: 3
Triglycerides: 96 mg/dL (ref 0.0–149.0)
VLDL: 19.2 mg/dL (ref 0.0–40.0)

## 2012-11-13 LAB — MICROALBUMIN / CREATININE URINE RATIO
Creatinine,U: 164.7 mg/dL
Microalb, Ur: 4.8 mg/dL — ABNORMAL HIGH (ref 0.0–1.9)

## 2012-11-13 MED ORDER — ATORVASTATIN CALCIUM 20 MG PO TABS: ORAL_TABLET | ORAL | Status: DC

## 2012-11-13 MED ORDER — LISINOPRIL 5 MG PO TABS: 5.0000 mg | ORAL_TABLET | Freq: Every day | ORAL | Status: DC

## 2012-11-13 MED ORDER — FUROSEMIDE 40 MG PO TABS: ORAL_TABLET | ORAL | Status: DC

## 2012-11-13 MED ORDER — NYSTATIN 100000 UNIT/GM EX CREA: TOPICAL_CREAM | Freq: Two times a day (BID) | CUTANEOUS | Status: DC

## 2012-11-13 MED ORDER — METOPROLOL SUCCINATE ER 25 MG PO TB24: ORAL_TABLET | ORAL | Status: DC

## 2012-11-13 NOTE — Patient Instructions (Signed)
-  We have ordered labs or studies at this visit. It can take up to 1-2 weeks for results and processing. We will contact you with instructions IF your results are abnormal. Normal results will be released to your [facility]. If you have not heard from [place] or can not find your results in [facility] in 2 weeks please contact our office.  -PLEASE SIGN UP FOR MYCHART TODAY   We recommend the following healthy lifestyle measures: - eat a healthy diet consisting of lots of vegetables, fruits, beans, nuts, seeds, healthy meats such as white chicken and fish and whole grains.  - avoid fried foods, fast food, processed foods, sodas, red meet and other fattening foods.  - get a least 150 minutes of aerobic exercise per week.   Use compression stockings daily  Cerave Cream (in a tub) for skin on legs  Follow up in: 4 months

## 2012-11-13 NOTE — Progress Notes (Signed)
Quick Note:  Left a message for return call. Will fax labs to Dr. Lowell Guitar. ______

## 2012-11-13 NOTE — Progress Notes (Signed)
Chief Complaint  Patient presents with  . 3-4 month follow up    HPI:  Follow up:  1)HTN/HLD/CdCHF: -meds include: lasix 40mg , lipitor, lisinopril 5 mg and toprol xl -stable -denies: CP, SOB, HA, DOE, swelling Lab Results  Component Value Date   CHOL 128 07/08/2012   HDL 43.90 07/08/2012   LDLCALC 67 07/08/2012   TRIG 88.0 07/08/2012   CHOLHDL 3 07/08/2012    2)DM with renal manifestations -followed by endocrine and nephrology - On lantus 45 units bid and mealtime humalog, reviewed recent notes from endocrine and appreciate recs to move mealtime insulin to before meals -reports BS at home have improved since, has follow up  Lab Results  Component Value Date   HGBA1C 8.9* 10/13/2012  -diet and exercise: walking 3 times per week, trying to work on diet  HM: -she was to have colonoscopy results faxed to [place] -flu vaccine offered today - got today ROS: See pertinent positives and negatives per HPI.  Past Medical History  Diagnosis Date  . Diabetes mellitus   . Hypertension   . Obesity   . Hyperlipidemia   . Peripheral arterial disease? Eval by vascular 2013 with no LE PAD and advised to stop pletal   . Diabetes mellitus with renal manifestations, uncontrolled 01/09/2012  . Diabetic neuropathy 01/09/2012  . Venous stasis of lower extremity 03/10/2012  . Diastolic heart failure - stage 1 per review of echo results from 02/2010 01/15/2012  . At risk for heart disease - +stres test 2012 followed by [facility], [place] with neg cardiac cath 03/2010 (non-obstructive CAD) per review of records, echo 02/2010 normal LVF 01/15/2012    Past Surgical History  Procedure Laterality Date  . Toe amputation  2008  . Eye surgery      Family History  Problem Relation Age of Onset  . Obesity Other   . Hypertension Other   . Hyperlipidemia Other   . Stroke Other   . Heart attack Other   . Diabetes Mother   . Hyperlipidemia Mother   . Hypertension Mother   . Heart attack Mother    . Stroke Mother   . Diabetes Sister   . Hypertension Brother   . Diabetes Sister   . Diabetes Sister   . Diabetes Sister     History   Social History  . Marital Status: Married    Spouse Name: N/A    Number of Children: N/A  . Years of Education: N/A   Social History Main Topics  . Smoking status: Former Smoker    Types: Cigarettes    Quit date: 02/20/1999  . Smokeless tobacco: Never Used  . Alcohol Use: Yes  . Drug Use: Yes  . Sexual Activity: None   Other Topics Concern  . None   Social History Narrative  . None    Current outpatient prescriptions:atorvastatin (LIPITOR) 20 MG tablet, TAKE 1 TABLET BY MOUTH EVERY DAY, Disp: 90 tablet, Rfl: 3;  ferrous gluconate (FERGON) 324 MG tablet, 324 mg. Take 1 tablet (324 mg total) by mouth daily with breakfast., Disp: , Rfl: ;  fluocinonide cream (LIDEX) [id number] %, Apply 1 application topically 2 (two) times daily. , Disp: , Rfl:  furosemide (LASIX) 40 MG tablet, TAKE 1 TABLET BY MOUTH TWICE A DAY FOR 30 DAYS, Disp: 180 tablet, Rfl: 3;  glucose blood (FREESTYLE LITE) test strip, 1 each by Other route as needed. , Disp: , Rfl: ;  Insulin Glargine (LANTUS SOLOSTAR) 100 UNIT/ML SOPN, Inject  45 units twice a day under skin, Disp: 10 pen, Rfl: 3;  insulin lispro (HUMALOG KWIKPEN) 100 UNIT/ML SOPN, INJECT 16-21 UNITS INTO SKIN 2 TIMES DAILY BEFORE A MEAL., Disp: 15 mL, Rfl: 3 Insulin Pen Needle (B-D ULTRAFINE III SHORT PEN) 31G X 8 MM MISC, Use as directed., Disp: 100 each, Rfl: 5;  lisinopril (PRINIVIL,ZESTRIL) 5 MG tablet, Take 1 tablet (5 mg total) by mouth daily., Disp: 90 tablet, Rfl: 3;  metoprolol succinate (TOPROL-XL) 25 MG 24 hr tablet, TAKE 1 TABLET BY MOUTH EVERY DAY, Disp: 90 tablet, Rfl: 3;  Vitamin D, Ergocalciferol, (DRISDOL) 50000 UNITS CAPS, 1 Units. , Disp: , Rfl:  nystatin cream (MYCOSTATIN), Apply topically 2 (two) times daily., Disp: 30 g, Rfl: 0  EXAM:  Filed Vitals:   11/13/12 0841  BP: 120/78  Temp: 98.7 F (37.1 C)     Body mass index is 41.61 kg/(m^2).  GENERAL: vitals reviewed and listed above, alert, oriented, appears well hydrated and in no acute distress  HEENT: atraumatic, conjunttiva clear, no obvious abnormalities on inspection of external nose and ears  NECK: no obvious masses on inspection  LUNGS: clear to auscultation bilaterally, no wheezes, rales or rhonchi, good air movement  CV: HRRR, no peripheral edema  MS: moves all extremities without noticeable abnormality  PSYCH: pleasant and cooperative, no obvious depression or anxiety  ASSESSMENT AND PLAN:  Discussed the following assessment and plan:  Need for prophylactic vaccination and inoculation against influenza - Plan: Flu Vaccine QUAD 36+ mos PF IM (Fluarix)  Hyperlipidemia - Plan: Lipid Panel, atorvastatin (LIPITOR) 20 MG tablet  Diabetes mellitus with renal manifestations, uncontrolled  Diabetic neuropathy - Plan: Microalbumin/Creatinine Ratio, Urine  Hypertension - Plan: Basic metabolic panel, furosemide (LASIX) 40 MG tablet, lisinopril (PRINIVIL,ZESTRIL) 5 MG tablet, metoprolol succinate (TOPROL-XL) 25 MG 24 hr tablet  Chronic kidney disease (CKD), stage IV (severe)  Diastolic heart failure - stage 1 per review of echo results from 02/2010  Venous stasis of lower extremity  Intertriginous candidiasis - Plan: nystatin cream (MYCOSTATIN)   -advised to improve on diet and exercise -follow up with endo and renal -FASTING LABS today -meds refilled -flu vaccine and pneumovax given -nystatin cr for intertrigous -compression and cerave for LE edema, dry skin -follow up 4 months -advised to schedule optho exam -Patient advised to return or notify a doctor immediately if symptoms worsen or persist or new concerns arise.  Patient Instructions  -We have ordered labs or studies at this visit. It can take up to 1-2 weeks for results and processing. We will contact you with instructions IF your results are abnormal.  Normal results will be released to your [facility]. If you have not heard from [place] or can not find your results in [facility] in 2 weeks please contact our office.  -PLEASE SIGN UP FOR MYCHART TODAY   We recommend the following healthy lifestyle measures: - eat a healthy diet consisting of lots of vegetables, fruits, beans, nuts, seeds, healthy meats such as white chicken and fish and whole grains.  - avoid fried foods, fast food, processed foods, sodas, red meet and other fattening foods.  - get a least 150 minutes of aerobic exercise per week.   Use compression stockings daily  Cerave Cream (in a tub) for skin on legs  Follow up in: 4 months            Savannah Harmening R.

## 2012-11-17 ENCOUNTER — Inpatient Hospital Stay (HOSPITAL_COMMUNITY)
Admission: EM | Admit: 2012-11-17 | Discharge: 2012-11-20 | DRG: 566 | Disposition: A | Discharge status: Home / Self Care | Payer: BC Managed Care – PPO | Attending: Internal Medicine | Admitting: Internal Medicine

## 2012-11-17 ENCOUNTER — Emergency Department (HOSPITAL_COMMUNITY): Payer: BC Managed Care – PPO

## 2012-11-17 ENCOUNTER — Encounter (HOSPITAL_COMMUNITY): Payer: Self-pay

## 2012-11-17 DIAGNOSIS — Z79899 Other long term (current) drug therapy: Secondary | ICD-10-CM

## 2012-11-17 DIAGNOSIS — I2510 Atherosclerotic heart disease of native coronary artery without angina pectoris: Secondary | ICD-10-CM | POA: Diagnosis present

## 2012-11-17 DIAGNOSIS — I878 Other specified disorders of veins: Secondary | ICD-10-CM

## 2012-11-17 DIAGNOSIS — Z9189 Other specified personal risk factors, not elsewhere classified: Secondary | ICD-10-CM

## 2012-11-17 DIAGNOSIS — D509 Iron deficiency anemia, unspecified: Secondary | ICD-10-CM | POA: Diagnosis present

## 2012-11-17 DIAGNOSIS — I5032 Chronic diastolic (congestive) heart failure: Secondary | ICD-10-CM | POA: Diagnosis present

## 2012-11-17 DIAGNOSIS — I129 Hypertensive chronic kidney disease with stage 1 through stage 4 chronic kidney disease, or unspecified chronic kidney disease: Secondary | ICD-10-CM | POA: Diagnosis present

## 2012-11-17 DIAGNOSIS — E1159 Type 2 diabetes mellitus with other circulatory complications: Secondary | ICD-10-CM | POA: Diagnosis present

## 2012-11-17 DIAGNOSIS — L97509 Non-pressure chronic ulcer of other part of unspecified foot with unspecified severity: Secondary | ICD-10-CM | POA: Diagnosis present

## 2012-11-17 DIAGNOSIS — A4901 Methicillin susceptible Staphylococcus aureus infection, unspecified site: Secondary | ICD-10-CM | POA: Diagnosis present

## 2012-11-17 DIAGNOSIS — I5030 Unspecified diastolic (congestive) heart failure: Secondary | ICD-10-CM | POA: Diagnosis present

## 2012-11-17 DIAGNOSIS — I739 Peripheral vascular disease, unspecified: Secondary | ICD-10-CM | POA: Diagnosis present

## 2012-11-17 DIAGNOSIS — D638 Anemia in other chronic diseases classified elsewhere: Secondary | ICD-10-CM

## 2012-11-17 DIAGNOSIS — Z6841 Body Mass Index (BMI) 40.0 and over, adult: Secondary | ICD-10-CM

## 2012-11-17 DIAGNOSIS — E1142 Type 2 diabetes mellitus with diabetic polyneuropathy: Secondary | ICD-10-CM | POA: Diagnosis present

## 2012-11-17 DIAGNOSIS — I872 Venous insufficiency (chronic) (peripheral): Secondary | ICD-10-CM | POA: Diagnosis present

## 2012-11-17 DIAGNOSIS — E1149 Type 2 diabetes mellitus with other diabetic neurological complication: Secondary | ICD-10-CM | POA: Diagnosis present

## 2012-11-17 DIAGNOSIS — Z833 Family history of diabetes mellitus: Secondary | ICD-10-CM

## 2012-11-17 DIAGNOSIS — E1165 Type 2 diabetes mellitus with hyperglycemia: Principal | ICD-10-CM | POA: Diagnosis present

## 2012-11-17 DIAGNOSIS — L0390 Cellulitis, unspecified: Secondary | ICD-10-CM | POA: Diagnosis present

## 2012-11-17 DIAGNOSIS — N183 Chronic kidney disease, stage 3 unspecified: Secondary | ICD-10-CM | POA: Diagnosis present

## 2012-11-17 DIAGNOSIS — E1140 Type 2 diabetes mellitus with diabetic neuropathy, unspecified: Secondary | ICD-10-CM

## 2012-11-17 DIAGNOSIS — L02619 Cutaneous abscess of unspecified foot: Secondary | ICD-10-CM | POA: Diagnosis present

## 2012-11-17 DIAGNOSIS — Z87891 Personal history of nicotine dependence: Secondary | ICD-10-CM

## 2012-11-17 DIAGNOSIS — I509 Heart failure, unspecified: Secondary | ICD-10-CM | POA: Diagnosis present

## 2012-11-17 DIAGNOSIS — IMO0002 Reserved for concepts with insufficient information to code with codable children: Principal | ICD-10-CM | POA: Diagnosis present

## 2012-11-17 DIAGNOSIS — E669 Obesity, unspecified: Secondary | ICD-10-CM | POA: Diagnosis present

## 2012-11-17 DIAGNOSIS — E785 Hyperlipidemia, unspecified: Secondary | ICD-10-CM | POA: Diagnosis present

## 2012-11-17 DIAGNOSIS — D631 Anemia in chronic kidney disease: Secondary | ICD-10-CM | POA: Diagnosis present

## 2012-11-17 DIAGNOSIS — I10 Essential (primary) hypertension: Secondary | ICD-10-CM

## 2012-11-17 DIAGNOSIS — Z794 Long term (current) use of insulin: Secondary | ICD-10-CM

## 2012-11-17 DIAGNOSIS — N184 Chronic kidney disease, stage 4 (severe): Secondary | ICD-10-CM | POA: Diagnosis present

## 2012-11-17 DIAGNOSIS — L03116 Cellulitis of left lower limb: Secondary | ICD-10-CM

## 2012-11-17 DIAGNOSIS — E1129 Type 2 diabetes mellitus with other diabetic kidney complication: Secondary | ICD-10-CM

## 2012-11-17 DIAGNOSIS — L03039 Cellulitis of unspecified toe: Secondary | ICD-10-CM | POA: Diagnosis present

## 2012-11-17 DIAGNOSIS — N058 Unspecified nephritic syndrome with other morphologic changes: Secondary | ICD-10-CM

## 2012-11-17 HISTORY — DX: Anemia in other chronic diseases classified elsewhere: D63.8

## 2012-11-17 LAB — CBC
HCT: 33.2 % — ABNORMAL LOW (ref 36.0–46.0)
Hemoglobin: 10.8 g/dL — ABNORMAL LOW (ref 12.0–15.0)
MCH: 26.7 pg (ref 26.0–34.0)
MCHC: 32.5 g/dL (ref 30.0–36.0)
MCV: 82 fL (ref 78.0–100.0)
Platelets: 303 10*3/uL (ref 150–400)
RBC: 4.05 MIL/uL (ref 3.87–5.11)
RDW: 14.8 % (ref 11.5–15.5)
WBC: 9.5 10*3/uL (ref 4.0–10.5)

## 2012-11-17 LAB — BASIC METABOLIC PANEL
BUN: 16 mg/dL (ref 6–23)
CO2: 25 mEq/L (ref 19–32)
Calcium: 9 mg/dL (ref 8.4–10.5)
Chloride: 100 mEq/L (ref 96–112)
Creatinine, Ser: 1.62 mg/dL — ABNORMAL HIGH (ref 0.50–1.10)
GFR calc Af Amer: 40 mL/min — ABNORMAL LOW (ref >90)

## 2012-11-17 LAB — GLUCOSE, CAPILLARY: Glucose-Capillary: 90 mg/dL (ref 70–99)

## 2012-11-17 MED ORDER — VANCOMYCIN HCL 10 G IV SOLR
1750.0000 mg | INTRAVENOUS | Status: DC
  Administered 2012-11-18 – 2012-11-19 (×2): 1750 mg via INTRAVENOUS
  Filled 2012-11-17 (×3): qty 1750

## 2012-11-17 MED ORDER — VANCOMYCIN HCL IN DEXTROSE 1-5 GM/200ML-% IV SOLN: 1000.0000 mg | Freq: Once | INTRAVENOUS | Status: DC

## 2012-11-17 MED ORDER — SODIUM CHLORIDE 0.9 % IJ SOLN
3.0000 mL | Freq: Two times a day (BID) | INTRAMUSCULAR | Status: DC
  Administered 2012-11-17 – 2012-11-19 (×2): 3 mL via INTRAVENOUS

## 2012-11-17 MED ORDER — VANCOMYCIN HCL IN DEXTROSE 1-5 GM/200ML-% IV SOLN: 1000.0000 mg | Freq: Two times a day (BID) | INTRAVENOUS | Status: DC

## 2012-11-17 MED ORDER — ONDANSETRON HCL 4 MG/2ML IJ SOLN: 4.0000 mg | Freq: Four times a day (QID) | INTRAMUSCULAR | Status: DC | PRN

## 2012-11-17 MED ORDER — ATORVASTATIN CALCIUM 20 MG PO TABS
20.0000 mg | ORAL_TABLET | Freq: Every day | ORAL | Status: DC
  Administered 2012-11-18 – 2012-11-20 (×3): 20 mg via ORAL
  Filled 2012-11-17 (×3): qty 1

## 2012-11-17 MED ORDER — SODIUM CHLORIDE 0.9 % IV SOLN
2500.0000 mg | Freq: Once | INTRAVENOUS | Status: AC
  Administered 2012-11-17: 2500 mg via INTRAVENOUS
  Filled 2012-11-17: qty 2500

## 2012-11-17 MED ORDER — INSULIN ASPART 100 UNIT/ML ~~LOC~~ SOLN
0.0000 [IU] | Freq: Three times a day (TID) | SUBCUTANEOUS | Status: DC
  Administered 2012-11-18 (×2): 3 [IU] via SUBCUTANEOUS
  Administered 2012-11-19: 5 [IU] via SUBCUTANEOUS
  Administered 2012-11-19: 3 [IU] via SUBCUTANEOUS
  Administered 2012-11-20: 5 [IU] via SUBCUTANEOUS

## 2012-11-17 MED ORDER — ENOXAPARIN SODIUM 60 MG/0.6ML ~~LOC~~ SOLN
60.0000 mg | SUBCUTANEOUS | Status: DC
  Administered 2012-11-17 – 2012-11-19 (×3): 60 mg via SUBCUTANEOUS
  Filled 2012-11-17 (×4): qty 0.6

## 2012-11-17 MED ORDER — SODIUM CHLORIDE 0.9 % IV SOLN: 250.0000 mL | INTRAVENOUS | Status: DC | PRN

## 2012-11-17 MED ORDER — LISINOPRIL 5 MG PO TABS
5.0000 mg | ORAL_TABLET | Freq: Every day | ORAL | Status: DC
  Administered 2012-11-18 – 2012-11-20 (×3): 5 mg via ORAL
  Filled 2012-11-17 (×3): qty 1

## 2012-11-17 MED ORDER — PIPERACILLIN-TAZOBACTAM 3.375 G IVPB
3.3750 g | Freq: Three times a day (TID) | INTRAVENOUS | Status: DC
  Administered 2012-11-18 – 2012-11-20 (×8): 3.375 g via INTRAVENOUS
  Filled 2012-11-17 (×11): qty 50

## 2012-11-17 MED ORDER — ACETAMINOPHEN 500 MG PO TABS: 1000.0000 mg | ORAL_TABLET | Freq: Four times a day (QID) | ORAL | Status: DC | PRN

## 2012-11-17 MED ORDER — FUROSEMIDE 40 MG PO TABS
40.0000 mg | ORAL_TABLET | Freq: Two times a day (BID) | ORAL | Status: DC
  Administered 2012-11-18: 40 mg via ORAL
  Filled 2012-11-17 (×3): qty 1

## 2012-11-17 MED ORDER — OXYCODONE HCL 5 MG PO TABS: 5.0000 mg | ORAL_TABLET | ORAL | Status: DC | PRN

## 2012-11-17 MED ORDER — INSULIN GLARGINE 100 UNIT/ML ~~LOC~~ SOLN
45.0000 [IU] | Freq: Two times a day (BID) | SUBCUTANEOUS | Status: DC
  Administered 2012-11-17 – 2012-11-20 (×6): 45 [IU] via SUBCUTANEOUS
  Filled 2012-11-17 (×7): qty 0.45

## 2012-11-17 MED ORDER — SODIUM CHLORIDE 0.9 % IJ SOLN: 3.0000 mL | INTRAMUSCULAR | Status: DC | PRN

## 2012-11-17 MED ORDER — METOPROLOL SUCCINATE ER 25 MG PO TB24
25.0000 mg | ORAL_TABLET | Freq: Every day | ORAL | Status: DC
  Administered 2012-11-18 – 2012-11-20 (×3): 25 mg via ORAL
  Filled 2012-11-17 (×3): qty 1

## 2012-11-17 MED ORDER — MORPHINE SULFATE 2 MG/ML IJ SOLN: 1.0000 mg | INTRAMUSCULAR | Status: DC | PRN

## 2012-11-17 MED ORDER — ONDANSETRON HCL 4 MG PO TABS: 4.0000 mg | ORAL_TABLET | Freq: Four times a day (QID) | ORAL | Status: DC | PRN

## 2012-11-17 MED ORDER — PIPERACILLIN-TAZOBACTAM 3.375 G IVPB: 3.3750 g | Freq: Once | INTRAVENOUS | Status: DC

## 2012-11-17 NOTE — ED Notes (Addendum)
Pt c/o cellulitis in L foot 3rd toe x 4 days.  Sts she was sent by her podiatrist.  Denies pain.  Hx of DM.

## 2012-11-17 NOTE — Progress Notes (Signed)
Utilization Review completed.  Jaymin Waln RN CM  

## 2012-11-17 NOTE — H&P (Signed)
[facility] History and Physical  Gentrie Jindal [id number] DOB: [birth year] DOA: 11/17/2012  Referring physician: Dr. Mingo Amber PCP: Lucretia Kern., DO  Specialists:  Dr. Cruzita Lederer (endocrinologist)  Chief Complaint: Left foot cellulitis and open wound affecting left 3rd toe.  HPI: Savannah Crawford is a 57 y.o. female medical history significant for diabetes mellitus, hypertension, soft obesity, hyperlipidemia, venous stasis of lower extremities, chronic kidney disease stage III (creatinine 1.5-1.7 at baseline) and diastolic heart failure type I; came to the hospital secondary to left foot cellulitis and open wounds affecting her third left toe. Patient has been seen by podiatry in in outpatient setting and for the last week or so she was treated with Bactrim with no significant response to her open 1. Patient reports that the one has been presenting fever and drainage in that for the last 2 days noticed erythema and swelling extending into her foot and ankle (dorsal aspect). Patient went to her podiatrist and he sent patient to ED for further evaluation and treatment. In the ED and x-ray demonstrated no osteomyelitis of her toe and just mild swelling of the soft tissue consistent with cellulitis. [facility] has been called to admit the patient given open wound, cellulitis that has failed outpatient therapy and history of diabetes. Patient denies fever, chills, chest pain, cough, shortness of breath, nausea/vomiting, abdominal pain, dysuria, melena, hematochezia or any other acute complaints.  Review of Systems:  Negative except as otherwise mentioned on history of present illness.  Past Medical History  Diagnosis Date  . Diabetes mellitus   . Hypertension   . Obesity   . Hyperlipidemia   . Peripheral arterial disease? Eval by vascular 2013 with no LE PAD and advised to stop pletal   . Diabetes mellitus with renal manifestations, uncontrolled 01/09/2012  . Diabetic neuropathy  01/09/2012  . Venous stasis of lower extremity 03/10/2012  . Diastolic heart failure - stage 1 per review of echo results from 02/2010 01/15/2012  . At risk for heart disease - +stres test 2012 followed by [facility], [place] with neg cardiac cath 03/2010 (non-obstructive CAD) per review of records, echo 02/2010 normal LVF 01/15/2012   Past Surgical History  Procedure Laterality Date  . Toe amputation  2008  . Eye surgery     Social History:  reports that she quit smoking about 13 years ago. Her smoking use included Cigarettes. She smoked 0.00 packs per day. She has never used smokeless tobacco. She reports that  drinks alcohol. She reports that she uses illicit drugs.  No Known Allergies  Family History  Problem Relation Age of Onset  . Obesity Other   . Hypertension Other   . Hyperlipidemia Other   . Stroke Other   . Heart attack Other   . Diabetes Mother   . Hyperlipidemia Mother   . Hypertension Mother   . Heart attack Mother   . Stroke Mother   . Diabetes Sister   . Hypertension Brother   . Diabetes Sister   . Diabetes Sister   . Diabetes Sister     Prior to Admission medications   Medication Sig Start Date End Date Taking? Authorizing Provider  acetaminophen (TYLENOL) 500 MG tablet Take 1,000 mg by mouth every 6 (six) hours as needed for pain.   Yes Historical Provider, MD  atorvastatin (LIPITOR) 20 MG tablet Take 20 mg by mouth daily.   Yes Historical Provider, MD  fluocinonide cream (LIDEX) [id number] % Apply 1 application topically 2 (two) times daily  as needed (rash).  12/19/11  Yes Historical Provider, MD  furosemide (LASIX) 40 MG tablet Take 40 mg by mouth 2 (two) times daily.   Yes Historical Provider, MD  insulin glargine (LANTUS) 100 UNIT/ML injection Inject 45 Units into the skin 2 (two) times daily.   Yes Historical Provider, MD  insulin lispro (HUMALOG) 100 UNIT/ML injection Inject 15-18 Units into the skin 2 (two) times daily. 15 units in am and 18 units in pm    Yes Historical Provider, MD  lisinopril (PRINIVIL,ZESTRIL) 5 MG tablet Take 5 mg by mouth daily.   Yes Historical Provider, MD  metoprolol succinate (TOPROL-XL) 25 MG 24 hr tablet Take 25 mg by mouth daily.   Yes Historical Provider, MD  nystatin cream (MYCOSTATIN) Apply 1 application topically 2 (two) times daily as needed for dry skin.   Yes Historical Provider, MD  Vitamin D, Ergocalciferol, (DRISDOL) 50000 UNITS CAPS Take 1 Units by mouth every 30 (thirty) days.  01/04/12  Yes Historical Provider, MD  glucose blood (FREESTYLE LITE) test strip 1 each by Other route as needed.  02/01/12   Historical Provider, MD  Insulin Pen Needle (B-D ULTRAFINE III SHORT PEN) 31G X 8 MM MISC Use as directed. 01/14/12   Lucretia Kern, DO   Physical Exam: Filed Vitals:   11/17/12 2029  BP: 148/52  Pulse: 62  Temp: 98.6 F (37 C)  Resp: 18     General:  Afebrile, no acute distress, cooperative to examination; well-hydrated and obese in appearance   Eyes: PERRLA, extraocular muscles intact, no icterus, no nystagmus  ENT: Moist mucous membranes, no erythema or exudate inside her mouth; no drainage out of the ears or nostrils  Neck: Supple, no thyromegaly, no bruits  Cardiovascular: S1 and S2, regular rate and rhythm; no rubs, no gallops or murmurs  Respiratory: Clear to auscultation bilaterally.  Abdomen: Soft, nontender, nondistended, positive bowel sounds.  Skin: No rash or petechiae; patient with left fluid swelling and erythema extending up to her ankles, tender and warm on palpation; no demarcated abscess or induration. Patient also with left third toe open wound on the dorsal aspect, positive serosanguineous discharge (per patient purulent discharge earlier)  Musculoskeletal: Full range of motion, 2+ edema bilaterally affecting lower extremities with worsening swelling around left ankle, due to cellulitic process.  Psychiatric: No suicidal ideation, no hallucinations, mood  stable.  Neurologic: Alert, awake and oriented x4, creatinine nerve grossly intact, muscle strength 5 over 5 bilaterally symmetrically; normal finger to nose.  Labs on Admission:  Basic Metabolic Panel:  Recent Labs Lab 11/13/12 0916 11/17/12 1658  NA 139 137  K 4.0 3.7  CL 107 100  CO2 27 25  GLUCOSE 119* 87  BUN 21 16  CREATININE 1.6* 1.62*  CALCIUM 9.0 9.0   CBC:  Recent Labs Lab 11/17/12 1658  WBC 9.5  HGB 10.8*  HCT 33.2*  MCV 82.0  PLT 303   Radiological Exams on Admission: Dg Toe 3rd Left  11/17/2012   CLINICAL DATA:  Wound on left 3rd toe, pain/swelling, cellulitis  EXAM: LEFT THIRD TOE  COMPARISON:  None.  FINDINGS: No fracture or dislocation is seen.  Degenerative changes of the 1st MTP joint.  Mild soft tissue swelling along the lateral aspect of the 3rd digit.  No underlying radiographic abnormality to suggest osteomyelitis.  No radiopaque foreign body is seen.  IMPRESSION: Mild soft tissue swelling along the lateral aspect of the 3rd digit.  No radiographic evidence of osteomyelitis.  Electronically Signed   By: Julian Hy M.D.   On: 11/17/2012 18:03   Assessment/Plan 1-left third toe open wound with a spreading Cellulitis: Patient cellulitis is ascending and at this moment affecting the dorsal aspect of her food and ankle. -Given history of diabetes and failure to treatment in the outpatient setting with the use of Bactrim Will admit patient to med surge bed and a start IV antibiotics. -Will use vancomycin and Zosyn -Will keep leg elevated and apply warm compresses 4 times a day -On x-ray has rule out ulcer myelitis affecting her third toe. -On exam there were no fluctuating masses or induration to make me concerned of any abscess at this point. Will follow clinical response and if needed patient will receive CT or MRI of her foot. -When necessary analgesics  2-Hyperlipidemia: Continue statins.  3-Diabetes mellitus with renal manifestations,  uncontrolled: Improving with current regimen. Most recent hemoglobin A1c demonstrated to be 8.9 (coming down to almost 10 in the last 6 month). Patient will continue outpatient followup with her endocrinologist and was using the hospital will continue Lantus twice a day and a sliding scale insulin.   4-Hypertension: Stable. Will continue home regimen.  5-Chronic kidney disease (CKD), stage III (severe): Creatinine of 1.6 at baseline. Will monitor creatinine trend especially with the use of vancomycin. -Pharmacy has been consulted to adjust medication dose for her kidney function.  6-Diastolic heart failure - stage 1 per review of echo results from 02/2010: No signs of shortness of breath, orthopnea, crackles or JVD on exam. Patient with chronic bilateral lower extremity edema given her history of venous stasis disease. -Chronic and compensated. -Will follow Daily weights and strict intake and output  7-Venous stasis of lower extremity: Chronic and stable. Left foot with worsening swelling giving ongoing cellulitis process. -Continue Lasix.  8-Anemia of chronic disease: No need for transfusion at this point. Will follow hemoglobin trend and check anemia panel. Her anemia is most likely secondary to chronic kidney disease.  DVT: Lovenox.   Code Status: Full Family Communication: No family at bedside Disposition Plan: LOS > 2 midnights, inpatient,   Time spent: 51 minutes  Ayron Fillinger [facility] Pager [phone]  If 7PM-7AM, please contact night-coverage [email or web address] Password [facility] 11/17/2012, 8:37 PM

## 2012-11-17 NOTE — ED Provider Notes (Signed)
CSN: [id number]     Arrival date & time 11/17/12  1509 History   First MD Initiated Contact with Patient 11/17/12 1707     Chief Complaint  Patient presents with  . Cellulitis   (Consider location/radiation/quality/duration/timing/severity/associated sxs/prior Treatment) HPI Patient presents emergency department with a wound to her third toe of her left foot and cellulitis of the foot.  She was seen by her podiatrist today and sent to the emergency department for further evaluation and care.  The patient denies chest pain, shortness of breath, fever, nausea, vomiting, abdominal pain, back pain, headache, blurred vision, weakness, numbness, or dizziness.  Patient, states, that she had not previous course of antibiotics, and I toe wound seemed to clear up, but then came back over the last 3 days.  Patient, states, that she did not take any other medications prior to arrival Past Medical History  Diagnosis Date  . Diabetes mellitus   . Hypertension   . Obesity   . Hyperlipidemia   . Peripheral arterial disease? Eval by vascular 2013 with no LE PAD and advised to stop pletal   . Diabetes mellitus with renal manifestations, uncontrolled 01/09/2012  . Diabetic neuropathy 01/09/2012  . Venous stasis of lower extremity 03/10/2012  . Diastolic heart failure - stage 1 per review of echo results from 02/2010 01/15/2012  . At risk for heart disease - +stres test 2012 followed by [facility], [place] with neg cardiac cath 03/2010 (non-obstructive CAD) per review of records, echo 02/2010 normal LVF 01/15/2012   Past Surgical History  Procedure Laterality Date  . Toe amputation  2008  . Eye surgery     Family History  Problem Relation Age of Onset  . Obesity Other   . Hypertension Other   . Hyperlipidemia Other   . Stroke Other   . Heart attack Other   . Diabetes Mother   . Hyperlipidemia Mother   . Hypertension Mother   . Heart attack Mother   . Stroke Mother   . Diabetes Sister   .  Hypertension Brother   . Diabetes Sister   . Diabetes Sister   . Diabetes Sister    History  Substance Use Topics  . Smoking status: Former Smoker    Types: Cigarettes    Quit date: 02/20/1999  . Smokeless tobacco: Never Used  . Alcohol Use: Yes   OB History   Grav Para Term Preterm Abortions TAB SAB Ect Mult Living                 Review of Systems All other systems negative except as documented in the HPI. All pertinent positives and negatives as reviewed in the HPI. Allergies  Review of patient's allergies indicates no known allergies.  Home Medications   Current Outpatient Rx  Name  Route  Sig  Dispense  Refill  . acetaminophen (TYLENOL) 500 MG tablet   Oral   Take 1,000 mg by mouth every 6 (six) hours as needed for pain.         Marland Kitchen atorvastatin (LIPITOR) 20 MG tablet   Oral   Take 20 mg by mouth daily.         . fluocinonide cream (LIDEX) 0.05 %   Topical   Apply 1 application topically 2 (two) times daily as needed (rash).          . furosemide (LASIX) 40 MG tablet   Oral   Take 40 mg by mouth 2 (two) times daily.         Marland Kitchen  insulin glargine (LANTUS) 100 UNIT/ML injection   Subcutaneous   Inject 45 Units into the skin 2 (two) times daily.         . insulin lispro (HUMALOG) 100 UNIT/ML injection   Subcutaneous   Inject 15-18 Units into the skin 2 (two) times daily. 15 units in am and 18 units in pm         . lisinopril (PRINIVIL,ZESTRIL) 5 MG tablet   Oral   Take 5 mg by mouth daily.         . metoprolol succinate (TOPROL-XL) 25 MG 24 hr tablet   Oral   Take 25 mg by mouth daily.         Marland Kitchen nystatin cream (MYCOSTATIN)   Topical   Apply 1 application topically 2 (two) times daily as needed for dry skin.         . Vitamin D, Ergocalciferol, (DRISDOL) 50000 UNITS CAPS   Oral   Take 1 Units by mouth every 30 (thirty) days.          Marland Kitchen glucose blood (FREESTYLE LITE) test strip   Other   1 each by Other route as needed.           . Insulin Pen Needle (B-D ULTRAFINE III SHORT PEN) 31G X 8 MM MISC      Use as directed.   100 each   5     Dx code: 250.62    BP 159/65  Pulse 71  Temp(Src) 98.1 F (36.7 C) (Oral)  Resp 18  SpO2 98% Physical Exam  Nursing note and vitals reviewed. Constitutional: She is oriented to person, place, and time. She appears well-developed and well-nourished. No distress.  HENT:  Head: Normocephalic and atraumatic.  Mouth/Throat: Oropharynx is clear and moist.  Eyes: Pupils are equal, round, and reactive to light.  Neck: Normal range of motion. Neck supple.  Cardiovascular: Normal rate, regular rhythm and normal heart sounds.   Pulmonary/Chest: Effort normal and breath sounds normal.  Neurological: She is alert and oriented to person, place, and time.  Skin: Skin is warm and dry.  Patient has an open wound to the left third toe with drainage noted.  There is redness of the foot, extending to the ankle    ED Course  Procedures (including critical care time) Labs Review Labs Reviewed  CBC - Abnormal; Notable for the following:    Hemoglobin 10.8 (*)    HCT 33.2 (*)    All other components within normal limits  BASIC METABOLIC PANEL - Abnormal; Notable for the following:    Creatinine, Ser 1.62 (*)    GFR calc non Af Amer 34 (*)    GFR calc Af Amer 40 (*)    All other components within normal limits   Imaging Review Dg Toe 3rd Left  11/17/2012   CLINICAL DATA:  Wound on left 3rd toe, pain/swelling, cellulitis  EXAM: LEFT THIRD TOE  COMPARISON:  None.  FINDINGS: No fracture or dislocation is seen.  Degenerative changes of the 1st MTP joint.  Mild soft tissue swelling along the lateral aspect of the 3rd digit.  No underlying radiographic abnormality to suggest osteomyelitis.  No radiopaque foreign body is seen.  IMPRESSION: Mild soft tissue swelling along the lateral aspect of the 3rd digit.  No radiographic evidence of osteomyelitis.   Electronically Signed   By: Julian Hy M.D.   On: 11/17/2012 18:03   patient be admitted to the hospital for further evaluation and  care and IV antibiotics.  Due to the fact she failed an outpatient course and there is now significant cellulitis of the foot to the ankle.  Spoke with the, [facility], who will admit the patient  [place], PA-C 11/20/12 [place], PA-C 11/20/12 1520

## 2012-11-17 NOTE — Progress Notes (Signed)
ANTIBIOTIC CONSULT NOTE - INITIAL  Pharmacy Consult for Vancomycin/Zosyn Indication: open wound on toe with spreading cellulitis  No Known Allergies  Patient Measurements: Height: 5\' 10"  (177.8 cm) Weight: 284 lb 6.4 oz (129.003 kg) IBW/kg (Calculated) : 68.5   Vital Signs: Temp: 97.4 F (36.3 C) (09/29 2047) Temp src: Oral (09/29 2047) BP: 177/67 mmHg (09/29 2047) Pulse Rate: 64 (09/29 2047) Intake/Output from previous day:   Intake/Output from this shift: Total I/O In: -  Out: 300 [Urine:300]  Labs:  Recent Labs  11/17/12 1658  WBC 9.5  HGB 10.8*  PLT 303  CREATININE 1.62*   Estimated Creatinine Clearance: 56.7 ml/min (by C-G formula based on Cr of 1.62). No results found for this basename: VANCOTROUGH, VANCOPEAK, VANCORANDOM, GENTTROUGH, GENTPEAK, GENTRANDOM, TOBRATROUGH, TOBRAPEAK, TOBRARND, AMIKACINPEAK, AMIKACINTROU, AMIKACIN,  in the last 72 hours   Microbiology: No results found for this or any previous visit (from the past 720 hour(s)).  Medical History: Past Medical History  Diagnosis Date  . Diabetes mellitus   . Hypertension   . Obesity   . Hyperlipidemia   . Peripheral arterial disease? Eval by vascular 2013 with no LE PAD and advised to stop pletal   . Diabetes mellitus with renal manifestations, uncontrolled 01/09/2012  . Diabetic neuropathy 01/09/2012  . Venous stasis of lower extremity 03/10/2012  . Diastolic heart failure - stage 1 per review of echo results from 02/2010 01/15/2012  . At risk for heart disease - +stres test 2012 followed by [facility], [place] with neg cardiac cath 03/2010 (non-obstructive CAD) per review of records, echo 02/2010 normal LVF 01/15/2012     Assessment: 2 yoF with left third toe open wound with a spreading cellulitis with fever and drainage.  Patient has history of diabetes and treatment failure with Bactrim outpatient.  She will be admitted for IV antibiotics, starting with Vancomycin and Zosyn.   Pharmacy to assist with antibiotics in patient with CKD.  ED x-ray demonstrated no osteomyelitis of her toe and just mild swelling of the soft tissue consistent with cellulitis.  SCr 1.62, CrCl ~43 ml/min/1.78m2 (normalized), ~77 ml/min (CG)  Weight 129 kg  WBC 9.5  Currently afebrile  Goal of Therapy:  Vancomycin trough level 10-15 mcg/ml  Plan:  1.  Continue Zosyn 3.375g IV q8h (4 hour infusion time).  No dose adjustment unless CrCl<20 ml/min. 2.  Vancomycin 2500 mg IV x 1 loading dose followed by 1750 mg IV q24h. 3.  F/u culture results, SCr, and trough level.   Hershal Coria 11/17/2012,8:58 PM

## 2012-11-18 LAB — GLUCOSE, CAPILLARY
Glucose-Capillary: 96 mg/dL (ref 70–99)
Glucose-Capillary: 192 mg/dL — ABNORMAL HIGH (ref 70–99)
Glucose-Capillary: 173 mg/dL — ABNORMAL HIGH (ref 70–99)

## 2012-11-18 LAB — CBC
MCH: 26.4 pg (ref 26.0–34.0)
MCHC: 32 g/dL (ref 30.0–36.0)
MCV: 82.3 fL (ref 78.0–100.0)
Platelets: 283 10*3/uL (ref 150–400)
RDW: 14.8 % (ref 11.5–15.5)
WBC: 8.9 10*3/uL (ref 4.0–10.5)

## 2012-11-18 LAB — BASIC METABOLIC PANEL
BUN: 18 mg/dL (ref 6–23)
CO2: 23 mEq/L (ref 19–32)
Calcium: 8.5 mg/dL (ref 8.4–10.5)
Creatinine, Ser: 1.73 mg/dL — ABNORMAL HIGH (ref 0.50–1.10)
GFR calc non Af Amer: 32 mL/min — ABNORMAL LOW (ref >90)
Glucose, Bld: 146 mg/dL — ABNORMAL HIGH (ref 70–99)
Sodium: 137 mEq/L (ref 135–145)

## 2012-11-18 LAB — IRON AND TIBC
Iron: 26 ug/dL — ABNORMAL LOW (ref 42–135)
Saturation Ratios: 9 % — ABNORMAL LOW (ref 20–55)
TIBC: 301 ug/dL (ref 250–470)

## 2012-11-18 LAB — VITAMIN B12: Vitamin B-12: 1005 pg/mL — ABNORMAL HIGH (ref 211–911)

## 2012-11-18 LAB — FERRITIN: Ferritin: 90 ng/mL (ref 10–291)

## 2012-11-18 MED ORDER — FUROSEMIDE 40 MG PO TABS
40.0000 mg | ORAL_TABLET | Freq: Every day | ORAL | Status: DC
  Administered 2012-11-19: 40 mg via ORAL
  Filled 2012-11-18: qty 1

## 2012-11-18 NOTE — Progress Notes (Addendum)
[facility] PROGRESS NOTE  Savannah Crawford [id number] DOB: [birth year] DOA: 11/17/2012 PCP: Lucretia Kern., DO  Assessment/Plan:  1. Diabetic ulcer with cellulitis -improving -continue IV Vanc/Zosyn today, could transition to PO abx tomorrow -Xray without evidence of osteomyelitis -Wound care consult -advised to FU with Dr.Duda if she fails this second course of abx down the road  2. DM -stable -continue lantus, SSI  3. CKd 3 -stable  4. HTN -stable, continue home regimen  5. Chronic diastolic CHF -compensated, continue metoprolol -cut down lasix to once daily   6. Iron deficiency anemia -will need colonoscopy as outpatient if not completed -Iron at discharge   Code Status: Full Family Communication: none at bedside Disposition Plan: home tomorrow if improved     Antibiotics:  Vanc/Zosyn  HPI/Subjective: Feels ok, swelling redness improving  Objective: Filed Vitals:   11/18/12 0914  BP: 128/71  Pulse:   Temp:   Resp:     Intake/Output Summary (Last 24 hours) at 11/18/12 1113 Last data filed at 11/18/12 0600  Gross per 24 hour  Intake    790 ml  Output    400 ml  Net    390 ml   Filed Weights   11/17/12 2047  Weight: 129.003 kg (284 lb 6.4 oz)    Exam:   General:  AAOx3  Cardiovascular: S1S2/RRR  Respiratory: CTAB  Abdomen: soft, NT, BS present  Musculoskeletal: L foot , with some warmth and swelling, 3 rd toe with open 2-19mm open wound, expresses scant amts sero-purulent discharge  Data Reviewed: Basic Metabolic Panel:  Recent Labs Lab 11/13/12 0916 11/17/12 1658 11/18/12 0353  NA 139 137 137  K 4.0 3.7 3.7  CL 107 100 104  CO2 27 25 23   GLUCOSE 119* 87 146*  BUN 21 16 18   CREATININE 1.6* 1.62* 1.73*  CALCIUM 9.0 9.0 8.5   Liver Function Tests: No results found for this basename: AST, ALT, ALKPHOS, BILITOT, PROT, ALBUMIN,  in the last 168 hours No results found for this basename: LIPASE, AMYLASE,  in the last  168 hours No results found for this basename: AMMONIA,  in the last 168 hours CBC:  Recent Labs Lab 11/17/12 1658 11/18/12 0353  WBC 9.5 8.9  HGB 10.8* 9.7*  HCT 33.2* 30.3*  MCV 82.0 82.3  PLT 303 283   Cardiac Enzymes: No results found for this basename: CKTOTAL, CKMB, CKMBINDEX, TROPONINI,  in the last 168 hours BNP (last 3 results) No results found for this basename: PROBNP,  in the last 8760 hours CBG:  Recent Labs Lab 11/17/12 2055 11/18/12 0733  GLUCAP 90 96    Recent Results (from the past 240 hour(s))  WOUND CULTURE     Status: None   Collection Time    11/17/12 10:04 PM      Result Value Range Status   Specimen Description TOE   Final   Special Requests NONE   Final   Gram Stain     Final   Value: NO WBC SEEN     NO SQUAMOUS EPITHELIAL CELLS SEEN     MODERATE GRAM POSITIVE COCCI     IN PAIRS     Performed at [facility]   Culture     Final   Value: NO GROWTH     Performed at [facility]   Report Status PENDING   Incomplete     Studies: Dg Toe 3rd Left  11/17/2012   CLINICAL DATA:  Wound on left 3rd  toe, pain/swelling, cellulitis  EXAM: LEFT THIRD TOE  COMPARISON:  None.  FINDINGS: No fracture or dislocation is seen.  Degenerative changes of the 1st MTP joint.  Mild soft tissue swelling along the lateral aspect of the 3rd digit.  No underlying radiographic abnormality to suggest osteomyelitis.  No radiopaque foreign body is seen.  IMPRESSION: Mild soft tissue swelling along the lateral aspect of the 3rd digit.  No radiographic evidence of osteomyelitis.   Electronically Signed   By: Julian Hy M.D.   On: 11/17/2012 18:03    Scheduled Meds: . atorvastatin  20 mg Oral Daily  . enoxaparin (LOVENOX) injection  60 mg Subcutaneous Q24H  . furosemide  40 mg Oral BID  . insulin aspart  0-15 Units Subcutaneous TID WC  . insulin glargine  45 Units Subcutaneous BID  . lisinopril  5 mg Oral Daily  . metoprolol succinate  25 mg Oral Daily   . piperacillin-tazobactam (ZOSYN)  IV  3.375 g Intravenous Q8H  . sodium chloride  3 mL Intravenous Q12H  . vancomycin  1,750 mg Intravenous Q24H   Continuous Infusions:   Principal Problem:   Cellulitis Active Problems:   Hyperlipidemia   Diabetes mellitus with renal manifestations, uncontrolled   Hypertension   Chronic kidney disease (CKD), stage IV (severe)   Diastolic heart failure - stage 1 per review of echo results from 02/2010   Venous stasis of lower extremity   Anemia of chronic disease    Time spent: 76min    [facility] Pager [phone] If 7PM-7AM, please contact night-coverage at [email or web address], password [facility] 11/18/2012, 11:13 AM  LOS: 1 day

## 2012-11-18 NOTE — Consult Note (Signed)
WOC consult Note Reason for Consult: Cellulitis dorsal aspect left third toe.  Wound type: Cellulitis Measurement: 0.5 cm x 0.5cm x 0.2cm with erythema extending 2 cm towards dorsal foot.  Wound bed: Pink, moist Drainage (amount, consistency, odor) Minimal, purulent drainage noted to wound.  Periwound: Erythematous Dressing procedure/placement/frequency: Wound cleansed with NS and dried gently.  Applied NS moist gauze to wound bed and covered with dry dressing and medfix tape.  Change twice daily.  Will not follow at this time.  Please re-consult if needed.  Domenic Moras RN BSN [place] Pager [phone]

## 2012-11-19 DIAGNOSIS — E1142 Type 2 diabetes mellitus with diabetic polyneuropathy: Secondary | ICD-10-CM

## 2012-11-19 DIAGNOSIS — E1149 Type 2 diabetes mellitus with other diabetic neurological complication: Secondary | ICD-10-CM

## 2012-11-19 LAB — GLUCOSE, CAPILLARY
Glucose-Capillary: 79 mg/dL (ref 70–99)
Glucose-Capillary: 213 mg/dL — ABNORMAL HIGH (ref 70–99)
Glucose-Capillary: 200 mg/dL — ABNORMAL HIGH (ref 70–99)
Glucose-Capillary: 258 mg/dL — ABNORMAL HIGH (ref 70–99)

## 2012-11-19 MED ORDER — FUROSEMIDE 40 MG PO TABS: 40.0000 mg | ORAL_TABLET | Freq: Every day | ORAL | Status: DC

## 2012-11-19 NOTE — Progress Notes (Signed)
[facility] PROGRESS NOTE  Savannah Crawford [id number] DOB: 04-13-1955 DOA: 11/17/2012 PCP: Lucretia Kern., DO  Assessment/Plan:  1. Infected Diabetic ulcer with cellulitis -improving, wound culture growing staph aureus but sensitivities pending and per lab will not be back till 10/2 -continue IV Vanc/Zosyn today, pending staph aureus sensitivies -Xray without evidence of osteomyelitis -continue local Wound care  -advised to FU with Dr.Duda if she fails this second course of abx down the road -will await culture sens for final/dc abx  2. DM -stable -continue lantus, SSI  3. CKd 3 -stable  4. HTN -stable, continue home regimen  5. Chronic diastolic CHF -compensated, continue metoprolol -cut down lasix to once daily   6. Iron deficiency anemia -will need colonoscopy as outpatient if not completed -Iron at discharge   Code Status: Full Family Communication: none at bedside Disposition Plan: home tomorrow pending culture sens      Antibiotics:  Vanc/Zosyn  HPI/Subjective: States leg redness resolved, and swelling decreasing.  Objective: Filed Vitals:   11/19/12 0955  BP: 135/60  Pulse: 65  Temp:   Resp:     Intake/Output Summary (Last 24 hours) at 11/19/12 1417 Last data filed at 11/19/12 1000  Gross per 24 hour  Intake    940 ml  Output   3200 ml  Net  -2260 ml   Filed Weights   11/17/12 2047 11/19/12 0541  Weight: 129.003 kg (284 lb 6.4 oz) 130.092 kg (286 lb 12.8 oz)    Exam:   General:  AAOx3  Cardiovascular: S1S2/RRR  Respiratory: CTAB  Abdomen: soft, NT, BS present  Musculoskeletal: L foot , with some warmth and swelling, top of 3rd toe with open 2-75mm open wound, expresses scant amt of sero-purulent discharge  Data Reviewed: Basic Metabolic Panel:  Recent Labs Lab 11/13/12 0916 11/17/12 1658 11/18/12 0353  NA 139 137 137  K 4.0 3.7 3.7  CL 107 100 104  CO2 27 25 23   GLUCOSE 119* 87 146*  BUN 21 16 18   CREATININE  1.6* 1.62* 1.73*  CALCIUM 9.0 9.0 8.5   Liver Function Tests: No results found for this basename: AST, ALT, ALKPHOS, BILITOT, PROT, ALBUMIN,  in the last 168 hours No results found for this basename: LIPASE, AMYLASE,  in the last 168 hours No results found for this basename: AMMONIA,  in the last 168 hours CBC:  Recent Labs Lab 11/17/12 1658 11/18/12 0353  WBC 9.5 8.9  HGB 10.8* 9.7*  HCT 33.2* 30.3*  MCV 82.0 82.3  PLT 303 283   Cardiac Enzymes: No results found for this basename: CKTOTAL, CKMB, CKMBINDEX, TROPONINI,  in the last 168 hours BNP (last 3 results) No results found for this basename: PROBNP,  in the last 8760 hours CBG:  Recent Labs Lab 11/18/12 1131 11/18/12 1713 11/18/12 2146 11/19/12 0725 11/19/12 1139  GLUCAP 192* 173* 187* 79 213*    Recent Results (from the past 240 hour(s))  WOUND CULTURE     Status: None   Collection Time    11/17/12 10:04 PM      Result Value Range Status   Specimen Description TOE   Final   Special Requests NONE   Final   Gram Stain     Final   Value: NO WBC SEEN     NO SQUAMOUS EPITHELIAL CELLS SEEN     MODERATE GRAM POSITIVE COCCI     IN PAIRS     Performed at [facility]  Final   Value: ABUNDANT STAPHYLOCOCCUS AUREUS     Note: RIFAMPIN AND GENTAMICIN SHOULD NOT BE USED AS SINGLE DRUGS FOR TREATMENT OF STAPH INFECTIONS.     Performed at [facility]   Report Status PENDING   Incomplete     Studies: Dg Toe 3rd Left  11/17/2012   CLINICAL DATA:  Wound on left 3rd toe, pain/swelling, cellulitis  EXAM: LEFT THIRD TOE  COMPARISON:  None.  FINDINGS: No fracture or dislocation is seen.  Degenerative changes of the 1st MTP joint.  Mild soft tissue swelling along the lateral aspect of the 3rd digit.  No underlying radiographic abnormality to suggest osteomyelitis.  No radiopaque foreign body is seen.  IMPRESSION: Mild soft tissue swelling along the lateral aspect of the 3rd digit.  No radiographic  evidence of osteomyelitis.   Electronically Signed   By: Julian Hy M.D.   On: 11/17/2012 18:03    Scheduled Meds: . atorvastatin  20 mg Oral Daily  . enoxaparin (LOVENOX) injection  60 mg Subcutaneous Q24H  . furosemide  40 mg Oral Daily  . insulin aspart  0-15 Units Subcutaneous TID WC  . insulin glargine  45 Units Subcutaneous BID  . lisinopril  5 mg Oral Daily  . metoprolol succinate  25 mg Oral Daily  . piperacillin-tazobactam (ZOSYN)  IV  3.375 g Intravenous Q8H  . sodium chloride  3 mL Intravenous Q12H  . vancomycin  1,750 mg Intravenous Q24H   Continuous Infusions:   Principal Problem:   Cellulitis Active Problems:   Hyperlipidemia   Diabetes mellitus with renal manifestations, uncontrolled   Hypertension   Chronic kidney disease (CKD), stage IV (severe)   Diastolic heart failure - stage 1 per review of echo results from 02/2010   Venous stasis of lower extremity   Anemia of chronic disease    Time spent: 63min    Sheila Oats  [facility] Pager [phone] If 7PM-7AM, please contact night-coverage at [email or web address], password [facility] 11/19/2012, 2:17 PM  LOS: 2 days

## 2012-11-19 NOTE — Progress Notes (Signed)
Pt able to change own drsg correctly

## 2012-11-20 LAB — CBC
Hemoglobin: 9.7 g/dL — ABNORMAL LOW (ref 12.0–15.0)
MCHC: 31.6 g/dL (ref 30.0–36.0)
MCV: 81.9 fL (ref 78.0–100.0)
Platelets: 285 10*3/uL (ref 150–400)
RBC: 3.75 MIL/uL — ABNORMAL LOW (ref 3.87–5.11)
WBC: 7.8 10*3/uL (ref 4.0–10.5)

## 2012-11-20 LAB — GLUCOSE, CAPILLARY
Glucose-Capillary: 85 mg/dL (ref 70–99)
Glucose-Capillary: 205 mg/dL — ABNORMAL HIGH (ref 70–99)

## 2012-11-20 LAB — WOUND CULTURE

## 2012-11-20 MED ORDER — FERROUS SULFATE 325 (65 FE) MG PO TBEC: 325.0000 mg | DELAYED_RELEASE_TABLET | Freq: Two times a day (BID) | ORAL | Status: DC

## 2012-11-20 MED ORDER — AMOXICILLIN-POT CLAVULANATE 875-125 MG PO TABS: 1.0000 | ORAL_TABLET | Freq: Two times a day (BID) | ORAL | Status: DC

## 2012-11-20 MED ORDER — SACCHAROMYCES BOULARDII 250 MG PO CAPS: 250.0000 mg | ORAL_CAPSULE | Freq: Two times a day (BID) | ORAL | Status: DC

## 2012-11-20 MED ORDER — FUROSEMIDE 40 MG PO TABS: 40.0000 mg | ORAL_TABLET | Freq: Two times a day (BID) | ORAL | Status: DC

## 2012-11-20 NOTE — ED Provider Notes (Signed)
Medical screening examination/treatment/procedure(s) were conducted as a shared visit with non-physician practitioner(s) and myself.  I personally evaluated the patient during the encounter  Diabetic with ulcer on toe. Previously treated on PO antibiotics, now with worsening of cellulitis and pain. Small ulcer on 3rd toe, cellulitis extending up the leg. Admitted to hospitalist.  Osvaldo Shipper, MD 11/20/12 [phone]

## 2012-11-20 NOTE — Progress Notes (Signed)
Inpatient Diabetes Program Recommendations  AACE/ADA: New Consensus Statement on Inpatient Glycemic Control (2013)  Target Ranges:  Prepandial:   less than 140 mg/dL      Peak postprandial:   less than 180 mg/dL (1-2 hours)      Critically ill patients:  140 - 180 mg/dL    Results for LINSI, BOTERO (MRN [id number]) as of 11/20/2012 12:27  Ref. Range 11/19/2012 07:25 11/19/2012 11:39 11/19/2012 17:50 11/19/2012 21:40  Glucose-Capillary Latest Range: 70-99 mg/dL 79 213 (H) 200 (H) 258 (H)    Results for GENETTA, AUBUT (MRN [id number]) as of 11/20/2012 12:27  Ref. Range 11/20/2012 07:33 11/20/2012 11:54  Glucose-Capillary Latest Range: 70-99 mg/dL 85 205 (H)    **Patient having elevated postprandial CBGs.    **Takes Humalog bid at home with breakfast and supper (15 units with breakfast and 18 units with supper)   **MD- Please consider adding scheduled meal coverage- Novolog 4 units tid with meals    Will follow. Wyn Quaker RN, MSN, CDE Diabetes Coordinator Inpatient Diabetes Program Team Pager: [phone] (8a-10p)

## 2012-11-20 NOTE — Discharge Summary (Signed)
Physician Discharge Summary  Savannah Crawford [id number] DOB: May 30, 1955 DOA: 11/17/2012  PCP: Lucretia Kern., DO  Admit date: 11/17/2012 Discharge date: 11/20/2012  Time spent: >30 minutes  Recommendations for Outpatient Follow-up:  Follow-up Information   Follow up with Lucretia Kern., DO. (in 1-2weeks, call for appt upon discharge)    Specialty:  Family Medicine   Contact information:   [place] [address] [phone]        Discharge Diagnoses:  Principal Problem:   Cellulitis Active Problems:   Hyperlipidemia   Diabetes mellitus with renal manifestations, uncontrolled   Hypertension   Chronic kidney disease (CKD), stage IV (severe)   Diastolic heart failure - stage 1 per review of echo results from 02/2010   Venous stasis of lower extremity   Anemia of chronic disease   Discharge Condition: Improved/stable  Diet recommendation: Modified carbohydrate  Filed Weights   11/17/12 2047 11/19/12 0541 11/20/12 0500  Weight: 129.003 kg (284 lb 6.4 oz) 130.092 kg (286 lb 12.8 oz) 129.927 kg (286 lb 7 oz)    History of present illness:  Savannah Crawford is a 57 y.o. female medical history significant for diabetes mellitus, hypertension, soft obesity, hyperlipidemia, venous stasis of lower extremities, chronic kidney disease stage III (creatinine 1.5-1.7 at baseline) and diastolic heart failure type I; came to the hospital secondary to left foot cellulitis and open wounds affecting her third left toe. Patient has been seen by podiatry in in outpatient setting and for the last week or so she was treated with Bactrim with no significant response to her open 1. Patient reports that the one has been presenting fever and drainage in that for the last 2 days noticed erythema and swelling extending into her foot and ankle (dorsal aspect). Patient went to her podiatrist and he sent patient to ED for further evaluation and treatment. In the ED and x-ray demonstrated no  osteomyelitis of her toe and just mild swelling of the soft tissue consistent with cellulitis. [facility] has been called to admit the patient given open wound, cellulitis that has failed outpatient therapy and history of diabetes.  Patient denies fever, chills, chest pain, cough, shortness of breath, nausea/vomiting, abdominal pain, dysuria, melena, hematochezia or any other acute complaints   Hospital Course:  1. Infected Diabetic ulcer with cellulitis  -upon admission wound culture was obtained and pt was placed on empiric abx with Vanc and zosyn -Wound care was consulted and saw pt in hospital and recommended BID dressing changes as directed  -Xray showed no evidence of osteomyelitis  -pt improved clinically with resolution of erythema and marked decrease in edema. She was afebrile and hemodynamically stable. I followed up on wound cultures>>methicillin senstive staph aureus, I discussed results with ID/Dr Tommy Medal and he recommended dc on augmentin to complete treatment course.   -pt was to continue local Wound care on d/c -advised to FU with Dr.Duda if she fails this second course of abx down the road  2. DM  -stable  -continue lantus, SSI  3. CKd 3  -stable  4. HTN  -stable, continue home regimen  5. Chronic diastolic CHF  - lasix was decreased to once daily dosing during this hospital stay and she was to follow up outpt  6. Iron deficiency anemia  -will need colonoscopy as outpatient>>pt to follow up with PCP for referral -Iron at discharge      Procedures:  none  Consultations:  phone consultation with ID  Discharge Exam: Filed  Vitals:   11/20/12 0509  BP: 131/52  Pulse: 64  Temp: 98.4 F (36.9 C)  Resp: 18   General: AAOx3  Cardiovascular: S1S2/RRR  Respiratory: CTAB  Abdomen: soft, NT, BS present  Musculoskeletal: L foot , with some warmth and swelling, top of 3rd toe with open 2-59mm open wound, no discharge expressed on exam today.   Discharge  Instructions  Discharge Orders   Future Appointments Provider Department Dept Phone   01/13/2013 9:00 AM Philemon Kingdom, MD [facility] [phone]   03/16/2013 8:45 AM Lucretia Kern, [place] [facility] [place]   Future Orders Complete By Expires   Diet Carb Modified  As directed    Increase activity slowly  As directed        Medication List         acetaminophen 500 MG tablet  Commonly known as:  TYLENOL  Take 1,000 mg by mouth every 6 (six) hours as needed for pain.     amoxicillin-clavulanate 875-125 MG per tablet  Commonly known as:  AUGMENTIN  Take 1 tablet by mouth 2 (two) times daily.     atorvastatin 20 MG tablet  Commonly known as:  LIPITOR  Take 20 mg by mouth daily.     fluocinonide cream 0.05 %  Commonly known as:  LIDEX  Apply 1 application topically 2 (two) times daily as needed (rash).     FREESTYLE LITE test strip  Generic drug:  glucose blood  1 each by Other route as needed.     furosemide 40 MG tablet  Commonly known as:  LASIX  Take 1 tablet (40 mg total) by mouth 2 (two) times daily.     insulin glargine 100 UNIT/ML injection  Commonly known as:  LANTUS  Inject 45 Units into the skin 2 (two) times daily.     insulin lispro 100 UNIT/ML injection  Commonly known as:  HUMALOG  Inject 15-18 Units into the skin 2 (two) times daily. 15 units in am and 18 units in pm     Insulin Pen Needle 31G X 8 MM Misc  Commonly known as:  B-D ULTRAFINE III SHORT PEN  Use as directed.     lisinopril 5 MG tablet  Commonly known as:  PRINIVIL,ZESTRIL  Take 5 mg by mouth daily.     metoprolol succinate 25 MG 24 hr tablet  Commonly known as:  TOPROL-XL  Take 25 mg by mouth daily.     nystatin cream  Commonly known as:  MYCOSTATIN  Apply 1 application topically 2 (two) times daily as needed for dry skin.     saccharomyces boulardii 250 MG capsule  Commonly known as:  FLORASTOR  Take 1 capsule (250 mg total) by  mouth 2 (two) times daily.     Vitamin D (Ergocalciferol) 50000 UNITS Caps capsule  Commonly known as:  DRISDOL  Take 1 Units by mouth every 30 (thirty) days.       Ferrous sulfate 325 mg EC tablet Take 1 tablet (325 mg total) by mouth (two) times daily with a meal No Known Allergies    The results of significant diagnostics from this hospitalization (including imaging, microbiology, ancillary and laboratory) are listed below for reference.    Significant Diagnostic Studies: Dg Toe 3rd Left  11/17/2012   CLINICAL DATA:  Wound on left 3rd toe, pain/swelling, cellulitis  EXAM: LEFT THIRD TOE  COMPARISON:  None.  FINDINGS: No fracture or dislocation is seen.  Degenerative changes of  the 1st MTP joint.  Mild soft tissue swelling along the lateral aspect of the 3rd digit.  No underlying radiographic abnormality to suggest osteomyelitis.  No radiopaque foreign body is seen.  IMPRESSION: Mild soft tissue swelling along the lateral aspect of the 3rd digit.  No radiographic evidence of osteomyelitis.   Electronically Signed   By: Julian Hy M.D.   On: 11/17/2012 18:03    Microbiology: Recent Results (from the past 240 hour(s))  WOUND CULTURE     Status: None   Collection Time    11/17/12 10:04 PM      Result Value Range Status   Specimen Description TOE   Final   Special Requests NONE   Final   Gram Stain     Final   Value: NO WBC SEEN     NO SQUAMOUS EPITHELIAL CELLS SEEN     MODERATE GRAM POSITIVE COCCI     IN PAIRS     Performed at [facility]   Culture     Final   Value: ABUNDANT STAPHYLOCOCCUS AUREUS     Note: RIFAMPIN AND GENTAMICIN SHOULD NOT BE USED AS SINGLE DRUGS FOR TREATMENT OF STAPH INFECTIONS. This organism is presumed to be Clindamycin resistant based on detection of inducible Clindamycin resistance.     Performed at [facility]   Report Status 11/20/2012 FINAL   Final   Organism ID, Bacteria STAPHYLOCOCCUS AUREUS   Final     Labs: Basic  Metabolic Panel:  Recent Labs Lab 11/17/12 1658 11/18/12 0353  NA 137 137  K 3.7 3.7  CL 100 104  CO2 25 23  GLUCOSE 87 146*  BUN 16 18  CREATININE 1.62* 1.73*  CALCIUM 9.0 8.5   Liver Function Tests: No results found for this basename: AST, ALT, ALKPHOS, BILITOT, PROT, ALBUMIN,  in the last 168 hours No results found for this basename: LIPASE, AMYLASE,  in the last 168 hours No results found for this basename: AMMONIA,  in the last 168 hours CBC:  Recent Labs Lab 11/17/12 1658 11/18/12 0353 11/20/12 0452  WBC 9.5 8.9 7.8  HGB 10.8* 9.7* 9.7*  HCT 33.2* 30.3* 30.7*  MCV 82.0 82.3 81.9  PLT 303 283 285   Cardiac Enzymes: No results found for this basename: CKTOTAL, CKMB, CKMBINDEX, TROPONINI,  in the last 168 hours BNP: BNP (last 3 results) No results found for this basename: PROBNP,  in the last 8760 hours CBG:  Recent Labs Lab 11/19/12 1139 11/19/12 1750 11/19/12 2140 11/20/12 0733 11/20/12 1154  GLUCAP 213* 200* 258* 85 205*       Signed:  Ruby Logiudice C  [facility] 11/20/2012, 1:46 PM

## 2012-11-20 NOTE — Progress Notes (Addendum)
ANTIBIOTIC CONSULT NOTE - follow-up  Pharmacy Consult for Vancomycin/Zosyn Indication: open wound on toe with spreading cellulitis  No Known Allergies  Patient Measurements: Height: 5\' 10"  (177.8 cm) Weight: 286 lb 7 oz (129.927 kg) IBW/kg (Calculated) : 68.5   Vital Signs: Temp: 98.4 F (36.9 C) (10/02 0509) Temp src: Oral (10/02 0509) BP: 131/52 mmHg (10/02 0509) Pulse Rate: 64 (10/02 0509) Intake/Output from previous day: 10/01 0701 - 10/02 0700 In: 1560 [P.O.:960; IV Piggyback:600] Out: 3400 [Urine:3400] Intake/Output from this shift: Total I/O In: 360 [P.O.:360] Out: -   Labs:  Recent Labs  11/17/12 1658 11/18/12 0353 11/20/12 0452  WBC 9.5 8.9 7.8  HGB 10.8* 9.7* 9.7*  PLT 303 283 285  CREATININE 1.62* 1.73*  --    Estimated Creatinine Clearance: 53.4 ml/min (by C-G formula based on Cr of 1.73). No results found for this basename: VANCOTROUGH, Corlis Leak, VANCORANDOM, GENTTROUGH, GENTPEAK, GENTRANDOM, TOBRATROUGH, TOBRAPEAK, TOBRARND, AMIKACINPEAK, AMIKACINTROU, AMIKACIN,  in the last 72 hours   Microbiology: Recent Results (from the past 720 hour(s))  WOUND CULTURE     Status: None   Collection Time    11/17/12 10:04 PM      Result Value Range Status   Specimen Description TOE   Final   Special Requests NONE   Final   Gram Stain     Final   Value: NO WBC SEEN     NO SQUAMOUS EPITHELIAL CELLS SEEN     MODERATE GRAM POSITIVE COCCI     IN PAIRS     Performed at [facility]   Culture     Final   Value: ABUNDANT STAPHYLOCOCCUS AUREUS     Note: RIFAMPIN AND GENTAMICIN SHOULD NOT BE USED AS SINGLE DRUGS FOR TREATMENT OF STAPH INFECTIONS. This organism is presumed to be Clindamycin resistant based on detection of inducible Clindamycin resistance.     Performed at [facility]   Report Status 11/20/2012 FINAL   Final   Organism ID, Bacteria STAPHYLOCOCCUS AUREUS   Final    Medical History: Past Medical History  Diagnosis Date  .  Diabetes mellitus   . Hypertension   . Obesity   . Hyperlipidemia   . Peripheral arterial disease? Eval by vascular 2013 with no LE PAD and advised to stop pletal   . Diabetes mellitus with renal manifestations, uncontrolled 01/09/2012  . Diabetic neuropathy 01/09/2012  . Venous stasis of lower extremity 03/10/2012  . Diastolic heart failure - stage 1 per review of echo results from 02/2010 01/15/2012  . At risk for heart disease - +stres test 2012 followed by [facility], [place] with neg cardiac cath 03/2010 (non-obstructive CAD) per review of records, echo 02/2010 normal LVF 01/15/2012     Assessment: 42 yoF with left third toe open wound with a spreading cellulitis with fever and drainage.  Patient has history of diabetes and treatment failure with Bactrim outpatient.  She will be admitted for IV antibiotics, starting with Vancomycin and Zosyn.  Pharmacy to assist with antibiotics in patient with CKD.  ED x-ray demonstrated no osteomyelitis of her toe and just mild swelling of the soft tissue consistent with cellulitis.  9/29 >> Vanc >> 9/29 >> Zosyn >>  Tmax: Afebrile WBC: 7.8 Renal: on 9/30, SCr 1.73 (up), CrCl ~41 ml/min/1.35m2 (normalized), ~53 ml/min (CG)  9/29 wound cx: MSSA   Goal of Therapy:  Vancomycin trough level 10-15 mcg/ml  Plan:  Day #3 vancomycin/zosn  Wound culture growing MSSA, suggest narrow antibiotic  therapy (such as cefazolin 2gm IV q8h)  Oral alternatives - Cephalexin 500mg  PO QID or Augmentin 875mg  PO BID  Doreene Eland, PharmD, BCPS.   Pager: [phone]  11/20/2012,8:50 AM

## 2013-01-13 ENCOUNTER — Ambulatory Visit: Payer: BC Managed Care – PPO | Admitting: Internal Medicine

## 2013-02-24 ENCOUNTER — Encounter: Payer: Self-pay | Admitting: Internal Medicine

## 2013-02-24 ENCOUNTER — Ambulatory Visit (INDEPENDENT_AMBULATORY_CARE_PROVIDER_SITE_OTHER): Payer: BC Managed Care – PPO | Admitting: Internal Medicine

## 2013-02-24 VITALS — BP 132/74 | HR 76 | Temp 98.1°F | Resp 12 | Wt 291.6 lb

## 2013-02-24 DIAGNOSIS — N058 Unspecified nephritic syndrome with other morphologic changes: Secondary | ICD-10-CM

## 2013-02-24 DIAGNOSIS — E1129 Type 2 diabetes mellitus with other diabetic kidney complication: Secondary | ICD-10-CM

## 2013-02-24 DIAGNOSIS — IMO0002 Reserved for concepts with insufficient information to code with codable children: Secondary | ICD-10-CM

## 2013-02-24 DIAGNOSIS — E1165 Type 2 diabetes mellitus with hyperglycemia: Principal | ICD-10-CM

## 2013-02-24 LAB — HEMOGLOBIN A1C: HEMOGLOBIN A1C: 9.5 % — AB (ref 4.6–6.5)

## 2013-02-24 LAB — HM DIABETES EYE EXAM

## 2013-02-24 MED ORDER — ATORVASTATIN CALCIUM 20 MG PO TABS: 20.0000 mg | ORAL_TABLET | Freq: Every day | ORAL | Status: DC

## 2013-02-24 MED ORDER — METOPROLOL SUCCINATE ER 25 MG PO TB24: 25.0000 mg | ORAL_TABLET | Freq: Every day | ORAL | Status: DC

## 2013-02-24 MED ORDER — INSULIN LISPRO 100 UNIT/ML ~~LOC~~ SOLN: SUBCUTANEOUS | Status: DC

## 2013-02-24 MED ORDER — INSULIN GLARGINE 100 UNIT/ML ~~LOC~~ SOLN: 40.0000 [IU] | Freq: Two times a day (BID) | SUBCUTANEOUS | Status: DC

## 2013-02-24 NOTE — Patient Instructions (Signed)
Please decrease Lantus to 40 units 2x a day. Increase mealtime Humalog as follows: - am: 16 units - dinner: 20 units Add the following Humalog Sliding scale to the mealtime doses: - 150- 165: + 1 unit  - 166- 180: + 2 units  - 181- 195: + 3 units  - 196- 210: + 4 units  - >210: + 5 units  Please return in 1 month with your sugar log.

## 2013-02-24 NOTE — Progress Notes (Signed)
Subjective:     Patient ID: Savannah Crawford, female   DOB: [birth year], 58 y.o.   MRN: [id number]  HPI [name] is a 58 y.o. woman returning for f/u for DM2, insulin-dependent, uncontrolled, with complications (CAD, CKD, diabetes neuropathy, diabetic retinopathy, PVD, amputated toe-R). Last visit 5 mo ago.   Last hemoglobin A1c was: Lab Results  Component Value Date   HGBA1C 8.9* 10/13/2012   HGBA1C 8.6* 07/08/2012   HGBA1C 9.2* 03/10/2012   She is on a regimen of: - Lantus 45 units bid - pen  - Humalog 14 in am and 18 units before dinner - injects before she eats now - pens She was on metformin in the past, but stopped due to chronic kidney disease.   She checks her sugars 2 x a day. She again does not bring her log.  - am: 104-167 >> 110-157 >> 98-170 - prelunch: 105 (it is difficult for her to check since she is at work) >> 98-120 >> n/c - predinner: 87-195 >> 100-180 >> 170-220 - bedtime: 187-310 (highest when checked after eating - if forgets insulin - maybe once a week) Lowest: 74 x 1 since last time, had one 80. She has hypoglycemia awareness. Highest sugar 207. She had 2 instances in the last month when she forgot to take her insulin, and her sugars increase to 160-200's.  She is also seen by nephrology for her chronic kidney disease.  Lab Results  Component Value Date   BUN 18 11/18/2012   CREATININE 1.73* 11/18/2012  She is on Lisinopril 5 bid.  Her cholesterol levels are at goal: Lab Results  Component Value Date   CHOL 133 11/13/2012   HDL 50.80 11/13/2012   LDLCALC 63 11/13/2012   TRIG 96.0 11/13/2012   CHOLHDL 3 11/13/2012  She is on Lipitor 20.  She was going to the gym regularly (was walking for 30 min >3/7 days a week) - not now since cold.  Has DR, last eye exam in 07/2011. She needs to make an appt soon.  She denies blurry vision, increased thirst, or urination. Has numbness and tingling in feet. She sees podiatry.   She also has a history of nonobstructive CAD,  with the positive stress test but a negative cardiac cath 2012, and also has diastolic heart failure. She has hypertension, hyperlipidemia, venous stasis, vitamin D deficiency.  I reviewed pt's medications, allergies, PMH, social hx, family hx and no changes required, except as above. Increased Lisinopril to bid. She   Review of Systems Constitutional: + weight gain/loss, no fatigue, no subjective hyperthermia/hypothermia Eyes: no blurry vision, no xerophthalmia ENT: no sore throat, no nodules palpated in throat, no dysphagia/odynophagia, no hoarseness Cardiovascular: no CP/SOB/palpitations/leg swelling Respiratory: no cough/SOB Gastrointestinal: no N/V/D/+C/ + GERD Musculoskeletal: no muscle/joint aches.  Skin: + rash -leg; + itching Neurological: no tremors/numbness/tingling/dizziness  Objective:   Physical Exam BP 132/74  Pulse 76  Temp(Src) 98.1 F (36.7 C) (Oral)  Resp 12  Wt 291 lb 9.6 oz (132.269 kg)  SpO2 98% Wt Readings from Last 3 Encounters:  02/24/13 291 lb 9.6 oz (132.269 kg)  11/20/12 286 lb 7 oz (129.927 kg)  11/13/12 290 lb (131.543 kg)   Constitutional: overweight, in NAD Eyes: PERRLA, EOMI, no exophthalmos ENT: moist mucous membranes, no thyromegaly, no cervical lymphadenopathy Cardiovascular: RRR, No MRG Respiratory: CTA B Gastrointestinal: abdomen soft, NT, ND, BS+ Musculoskeletal: no deformities, strength intact in all 4 Skin: moist, warm, + dark rash left lower leg Neurological: no tremor  with outstretched hands, DTR normal in all 4  Assessment:     1. DM2, insulin-dependent, uncontrolled, with complications - nonobstructive CAD - CKD - diabetes neuropathy - diabetic retinopathy - PVD - R amputated toe    Plan:     Pt with worsened diabetes control over the holidays. Missed the 3-mo appt. - I advised her to: Patient Instructions  Please decrease Lantus to 40 units 2x a day. Increase mealtime Humalog as follows: - am: 16 units - dinner: 20  units Add the following Humalog Sliding scale to the mealtime doses: - 150- 165: + 1 unit  - 166- 180: + 2 units  - 181- 195: + 3 units  - 196- 210: + 4 units  - >210: + 5 units  Please return in 1 month with your sugar log.  - refilled insulin and also 1 mo supply of Toprol and Atorvastatin as she runs out before appt with PCP in 20 days - advised to see her eye dr - will check a HbA1C today - RTC in 1 mo with sugar log.  Office Visit on 02/24/2013  Component Date Value Range Status  . Hemoglobin A1C 02/24/2013 9.5* 4.6 - 6.5 % Final   Glycemic Control Guidelines for People with Diabetes:Non Diabetic:  <6%Goal of Therapy: <7%Additional Action Suggested:  >8%    HbA1c increased as expected. Continue with the above plan.

## 2013-03-01 ENCOUNTER — Other Ambulatory Visit: Payer: Self-pay | Admitting: Internal Medicine

## 2013-03-16 ENCOUNTER — Encounter: Payer: Self-pay | Admitting: Family Medicine

## 2013-03-16 ENCOUNTER — Ambulatory Visit (INDEPENDENT_AMBULATORY_CARE_PROVIDER_SITE_OTHER): Payer: BC Managed Care – PPO | Admitting: Family Medicine

## 2013-03-16 VITALS — BP 120/72 | Temp 98.8°F | Wt 295.0 lb

## 2013-03-16 DIAGNOSIS — I10 Essential (primary) hypertension: Secondary | ICD-10-CM

## 2013-03-16 DIAGNOSIS — N058 Unspecified nephritic syndrome with other morphologic changes: Secondary | ICD-10-CM

## 2013-03-16 DIAGNOSIS — IMO0002 Reserved for concepts with insufficient information to code with codable children: Secondary | ICD-10-CM

## 2013-03-16 DIAGNOSIS — Z789 Other specified health status: Secondary | ICD-10-CM

## 2013-03-16 DIAGNOSIS — Z9189 Other specified personal risk factors, not elsewhere classified: Secondary | ICD-10-CM

## 2013-03-16 DIAGNOSIS — I872 Venous insufficiency (chronic) (peripheral): Secondary | ICD-10-CM

## 2013-03-16 DIAGNOSIS — I878 Other specified disorders of veins: Secondary | ICD-10-CM

## 2013-03-16 DIAGNOSIS — E1129 Type 2 diabetes mellitus with other diabetic kidney complication: Secondary | ICD-10-CM

## 2013-03-16 DIAGNOSIS — D638 Anemia in other chronic diseases classified elsewhere: Secondary | ICD-10-CM

## 2013-03-16 DIAGNOSIS — N184 Chronic kidney disease, stage 4 (severe): Secondary | ICD-10-CM

## 2013-03-16 DIAGNOSIS — I5030 Unspecified diastolic (congestive) heart failure: Secondary | ICD-10-CM

## 2013-03-16 DIAGNOSIS — E1165 Type 2 diabetes mellitus with hyperglycemia: Secondary | ICD-10-CM

## 2013-03-16 DIAGNOSIS — E785 Hyperlipidemia, unspecified: Secondary | ICD-10-CM

## 2013-03-16 LAB — BASIC METABOLIC PANEL
BUN: 26 mg/dL — ABNORMAL HIGH (ref 6–23)
CHLORIDE: 106 meq/L (ref 96–112)
CO2: 26 mEq/L (ref 19–32)
Calcium: 8.7 mg/dL (ref 8.4–10.5)
Creatinine, Ser: 1.8 mg/dL — ABNORMAL HIGH (ref 0.4–1.2)
GFR: 37.02 mL/min — ABNORMAL LOW (ref >60.00)
Glucose, Bld: 118 mg/dL — ABNORMAL HIGH (ref 70–99)
Potassium: 4.1 mEq/L (ref 3.5–5.1)
SODIUM: 139 meq/L (ref 135–145)

## 2013-03-16 LAB — LIPID PANEL
CHOL/HDL RATIO: 3
Cholesterol: 132 mg/dL (ref 0–200)
HDL: 48.9 mg/dL (ref >39.00)
LDL Cholesterol: 69 mg/dL (ref 0–99)
TRIGLYCERIDES: 72 mg/dL (ref 0.0–149.0)
VLDL: 14.4 mg/dL (ref 0.0–40.0)

## 2013-03-16 NOTE — Progress Notes (Signed)
Pre visit review using our clinic review tool, if applicable. No additional management support is needed unless otherwise documented below in the visit note. 

## 2013-03-16 NOTE — Progress Notes (Signed)
Chief Complaint  Patient presents with  . 4 month follow up    HPI:  Follow up:  1-5: HTN, Dyslipidemia, CAD, CdCHF, Obesity: -meds: lisinopril 10mg , toprol xl 25mg , lasix 40mg , lipitor 20mg  -lifestyle: not exercising, she reports working on more veggies and fruit, reports she is going to start watching her sweets and carbs -denies: CP, SOB, palpitation, increased welling  6:DM with complication/CRI: -followed by endocrinology and nephrology - unfortunately worsened -reviewed recent endocrine notes - appears pt had quit exercising and did not bring BS log to appointment, insulin increased and lifestyle recs advised, appreciate recs -eye appointment: 2 weeks ago with -reports nephrologist at [facility] increased lisinopril  Venous Stasis Derm/Venous insuff: -advised compression stockings, elevation and good skin care in the past -she reports:wears compression stockings sometimes, seeing dermatologist  Anemia of chronic disease: -on iron  ROS: See pertinent positives and negatives per HPI.  Past Medical History  Diagnosis Date  . Diabetes mellitus   . Hypertension   . Obesity   . Hyperlipidemia   . Peripheral arterial disease? Eval by vascular 2013 with no LE PAD and advised to stop pletal   . Diabetes mellitus with renal manifestations, uncontrolled 01/09/2012  . Diabetic neuropathy 01/09/2012  . Venous stasis of lower extremity 03/10/2012  . Diastolic heart failure - stage 1 per review of echo results from 02/2010 01/15/2012  . At risk for heart disease - +stres test 2012 followed by [facility], [place] with neg cardiac cath 03/2010 (non-obstructive CAD) per review of records, echo 02/2010 normal LVF 01/15/2012    Past Surgical History  Procedure Laterality Date  . Toe amputation  2008  . Eye surgery      Family History  Problem Relation Age of Onset  . Obesity Other   . Hypertension Other   . Hyperlipidemia Other   . Stroke Other   . Heart attack Other   .  Diabetes Mother   . Hyperlipidemia Mother   . Hypertension Mother   . Heart attack Mother   . Stroke Mother   . Diabetes Sister   . Hypertension Brother   . Diabetes Sister   . Diabetes Sister   . Diabetes Sister     History   Social History  . Marital Status: Married    Spouse Name: N/A    Number of Children: N/A  . Years of Education: N/A   Social History Main Topics  . Smoking status: Former Smoker    Types: Cigarettes    Quit date: 02/20/1999  . Smokeless tobacco: Never Used  . Alcohol Use: Yes  . Drug Use: Yes  . Sexual Activity: None   Other Topics Concern  . None   Social History Narrative  . None    Current outpatient prescriptions:atorvastatin (LIPITOR) 20 MG tablet, Take 1 tablet (20 mg total) by mouth daily., Disp: 30 tablet, Rfl: 0;  ferrous sulfate 325 (65 FE) MG EC tablet, Take 1 tablet (325 mg total) by mouth 2 (two) times daily with a meal., Disp: 60 tablet, Rfl: 3;  fluocinonide cream (LIDEX) [id number] %, Apply 1 application topically 2 (two) times daily as needed (rash). , Disp: , Rfl:  furosemide (LASIX) 40 MG tablet, Take 1 tablet (40 mg total) by mouth 2 (two) times daily., Disp: 30 tablet, Rfl: ;  glucose blood (FREESTYLE LITE) test strip, 1 each by Other route as needed. , Disp: , Rfl: ;  Insulin Glargine (LANTUS SOLOSTAR) 100 UNIT/ML Solostar Pen, Inject 40 Units into  the skin 2 (two) times daily., Disp: 5 pen, Rfl: 3 insulin glargine (LANTUS) 100 UNIT/ML injection, Inject 0.4 mLs (40 Units total) into the skin 2 (two) times daily., Disp: 15 mL, Rfl: 11;  insulin lispro (HUMALOG) 100 UNIT/ML injection, Inject up to 40 units a day as advised - pens, Disp: 15 mL, Rfl: 11;  Insulin Pen Needle (B-D ULTRAFINE III SHORT PEN) 31G X 8 MM MISC, Use as directed., Disp: 100 each, Rfl: 5;  lisinopril (PRINIVIL,ZESTRIL) 5 MG tablet, Take 10 mg by mouth daily. , Disp: , Rfl:  metoprolol succinate (TOPROL-XL) 25 MG 24 hr tablet, Take 1 tablet (25 mg total) by mouth daily.,  Disp: 30 tablet, Rfl: 0;  nystatin cream (MYCOSTATIN), Apply 1 application topically 2 (two) times daily as needed for dry skin., Disp: , Rfl: ;  saccharomyces boulardii (FLORASTOR) 250 MG capsule, Take 1 capsule (250 mg total) by mouth 2 (two) times daily., Disp: 30 capsule, Rfl: 0 Vitamin D, Ergocalciferol, (DRISDOL) 50000 UNITS CAPS, Take 1 Units by mouth every 30 (thirty) days. , Disp: , Rfl:   EXAM:  Filed Vitals:   03/16/13 0850  BP: 120/72  Temp: 98.8 F (37.1 C)    Body mass index is 42.33 kg/(m^2).  GENERAL: vitals reviewed and listed above, alert, oriented, appears well hydrated and in no acute distress  HEENT: atraumatic, conjunttiva clear, no obvious abnormalities on inspection of external nose and ears  NECK: no obvious masses on inspection  LUNGS: clear to auscultation bilaterally, no wheezes, rales or rhonchi, good air movement  CV: HRRR, no peripheral edema  MS: moves all extremities without noticeable abnormality  PSYCH: pleasant and cooperative, no obvious depression or anxiety  ASSESSMENT AND PLAN:  Discussed the following assessment and plan:  Hyperlipidemia - Plan: Lipid Panel  Diabetes mellitus with renal manifestations, uncontrolled  Hypertension - Plan: Basic metabolic panel  Chronic kidney disease (CKD), stage IV (severe)  At risk for heart disease - +stres test 2012 followed by [facility], [place] with neg cardiac cath 03/2010 (non-obstructive CAD) per review of records, echo 02/2010 normal LVF  Diastolic heart failure - stage 1 per review of echo results from 02/2010  Venous stasis of lower extremity  Severe obesity (BMI >= 40)  Anemia of chronic disease  -stressed importance of lifestyle and discussed options for weight management -advised close monitoring of home BS and advised she always bring BS logs to all doctor appts -FASTING LABS -follow up 3 months -Patient advised to return or notify a doctor immediately if symptoms  worsen or persist or new concerns arise.  Patient Instructions  -We have ordered labs or studies at this visit. It can take up to 1-2 weeks for results and processing. We will contact you with instructions IF your results are abnormal. Normal results will be released to your [facility]. If you have not heard from [place] or can not find your results in [facility] in 2 weeks please contact our office.  -PLEASE SIGN UP FOR MYCHART TODAY   We recommend the following healthy lifestyle measures: - eat a healthy diet consisting of lots of vegetables, fruits, beans, nuts, seeds, healthy meats such as white chicken and fish and whole grains.  - avoid fried foods, fast food, processed foods, sodas, red meet and other fattening foods.  - get a least 150 minutes of aerobic exercise per week.   Follow up in: 3 months      Savannah Show R.

## 2013-03-16 NOTE — Patient Instructions (Signed)
-  We have ordered labs or studies at this visit. It can take up to 1-2 weeks for results and processing. We will contact you with instructions IF your results are abnormal. Normal results will be released to your [facility]. If you have not heard from [place] or can not find your results in [facility] in 2 weeks please contact our office.  -PLEASE SIGN UP FOR MYCHART TODAY   We recommend the following healthy lifestyle measures: - eat a healthy diet consisting of lots of vegetables, fruits, beans, nuts, seeds, healthy meats such as white chicken and fish and whole grains.  - avoid fried foods, fast food, processed foods, sodas, red meet and other fattening foods.  - get a least 150 minutes of aerobic exercise per week.   Follow up in: 3 months

## 2013-03-18 ENCOUNTER — Telehealth: Payer: Self-pay

## 2013-03-18 NOTE — Telephone Encounter (Signed)
Relevant patient education mailed to patient.  

## 2013-03-25 ENCOUNTER — Telehealth: Payer: Self-pay | Admitting: Family Medicine

## 2013-03-25 NOTE — Telephone Encounter (Signed)
Relevant patient education mailed to patient.  

## 2013-05-19 ENCOUNTER — Ambulatory Visit (INDEPENDENT_AMBULATORY_CARE_PROVIDER_SITE_OTHER): Payer: BC Managed Care – PPO | Admitting: Internal Medicine

## 2013-05-19 ENCOUNTER — Encounter: Payer: Self-pay | Admitting: Internal Medicine

## 2013-05-19 VITALS — BP 124/82 | HR 80 | Temp 98.4°F | Resp 12 | Wt 293.0 lb

## 2013-05-19 DIAGNOSIS — E1165 Type 2 diabetes mellitus with hyperglycemia: Principal | ICD-10-CM

## 2013-05-19 DIAGNOSIS — E1129 Type 2 diabetes mellitus with other diabetic kidney complication: Secondary | ICD-10-CM

## 2013-05-19 DIAGNOSIS — N058 Unspecified nephritic syndrome with other morphologic changes: Secondary | ICD-10-CM

## 2013-05-19 DIAGNOSIS — IMO0002 Reserved for concepts with insufficient information to code with codable children: Secondary | ICD-10-CM

## 2013-05-19 NOTE — Patient Instructions (Signed)
-   decrease Lantus to 35 units 2x a day - change Humalog mealtime: - am: use 18 units for sausage and eggs       22 units for cereal + milk - dinner: increase to 25 units  - Humalog Sliding scale: - 150- 165: + 1 unit  - 166- 180: + 2 units  - 181- 195: + 3 units  - 196- 210: + 4 units  - >210: + 5 units   Please return in 1.5 month with your sugar log.

## 2013-05-19 NOTE — Progress Notes (Signed)
Subjective:     Patient ID: Savannah Crawford, female   DOB: [birth year], 58 y.o.   MRN: [id number]  HPI [name] is a 58 y.o. woman returning for f/u for DM2, insulin-dependent, uncontrolled, with complications (CAD, CKD, diabetes neuropathy, diabetic retinopathy, PVD, amputated toe-R). Last visit ~3 mo ago.   Last hemoglobin A1c was: Lab Results  Component Value Date   HGBA1C 9.5* 02/24/2013   HGBA1C 8.9* 10/13/2012   HGBA1C 8.6* 07/08/2012   She is on a regimen of: - Lantus 45 >> 40 units bid - pen  Humalog mealtime: - am: 16 >> 22 units  - dinner: 20 >> 22 units Humalog Sliding scale: - 150- 165: + 1 unit  - 166- 180: + 2 units  - 181- 195: + 3 units  - 196- 210: + 4 units  - >210: + 5 units  She was on metformin in the past, but stopped due to chronic kidney disease.   She checks her sugars 2 x a day. She again does not bring her log. She tells me she can feel sugars dropping b/f lunch if she does not have many carbs in am. - am: 104-167 >> 110-157 >> 98-170 >> 98-153 - prelunch: 105 (it is difficult for her to check since she is at work) >> 98-120 >> n/c - predinner: 87-195 >> 100-180 >> 170-220 >> 170-180 (occasionally 200 less than once a week) - bedtime: 187-310 (highest when checked after eating - if forgets insulin - maybe once a week) No lows. She has hypoglycemia awareness. Highest sugar 212 since last visit.   She is also seen by nephrology for her chronic kidney disease.  Lab Results  Component Value Date   BUN 26* 03/16/2013   CREATININE 1.8* 03/16/2013  She is on Lisinopril 5 bid.  Her cholesterol levels are at goal: Lab Results  Component Value Date   CHOL 132 03/16/2013   HDL 48.90 03/16/2013   LDLCALC 69 03/16/2013   TRIG 72.0 03/16/2013   CHOLHDL 3 03/16/2013  She is on Lipitor 20.  She was going to the gym regularly (was walking for 30 min >3/7 days a week) - not now since cold.  Has DR, last eye exam in 02/2013. No DR. She denies blurry vision, increased  thirst, or urination. Has numbness and tingling in feet. She sees podiatry.   She also has a history of nonobstructive CAD, with the positive stress test but a negative cardiac cath 2012, and also has diastolic heart failure. She has hypertension, hyperlipidemia, venous stasis, vitamin D deficiency.  I reviewed pt's medications, allergies, PMH, social hx, family hx and no changes required, except as above.   Review of Systems Constitutional: no weight gain/loss, no fatigue, no subjective hyperthermia/hypothermia Eyes: no blurry vision, no xerophthalmia ENT: no sore throat, no nodules palpated in throat, no dysphagia/odynophagia, no hoarseness Cardiovascular: no CP/SOB/palpitations/leg swelling Respiratory: no cough/SOB Gastrointestinal: no N/V/D/C Musculoskeletal: no muscle/joint aches Skin: no rashes Neurological: no tremors/numbness/tingling/dizziness   Objective:   Physical Exam BP 124/82  Pulse 80  Temp(Src) 98.4 F (36.9 C) (Oral)  Resp 12  Wt 293 lb (132.904 kg)  SpO2 97% Wt Readings from Last 3 Encounters:  05/19/13 293 lb (132.904 kg)  03/16/13 295 lb (133.811 kg)  02/24/13 291 lb 9.6 oz (132.269 kg)   Constitutional: overweight, in NAD Eyes: PERRLA, EOMI, no exophthalmos ENT: moist mucous membranes, no thyromegaly, no cervical lymphadenopathy Cardiovascular: RRR, No MRG Respiratory: CTA B Gastrointestinal: abdomen soft, NT, ND, BS+  Musculoskeletal: no deformities, strength intact in all 4 Skin: moist, warm Neurological: no tremor with outstretched hands, DTR normal in all 4  Assessment:     1. DM2, insulin-dependent, uncontrolled, with complications - nonobstructive CAD - CKD - diabetes neuropathy - diabetic retinopathy - PVD - R amputated toe    Plan:     Pt with improved sugars after adjusting her basal-bolus regimen.  - I advised her to:   Patient Instructions  - decrease Lantus to 35 units 2x a day - change Humalog mealtime: - am: use 18 units  for sausage and eggs       22 units for cereal + milk - dinner: increase to 25 units  - Humalog Sliding scale: - 150- 165: + 1 unit  - 166- 180: + 2 units  - 181- 195: + 3 units  - 196- 210: + 4 units  - >210: + 5 units   Please return in 1.5 month with your sugar log.   - up to date with eye exams - will check a HbA1C at next visit - RTC in 1.5 mo with sugar log.

## 2013-06-03 ENCOUNTER — Other Ambulatory Visit: Payer: Self-pay | Admitting: *Deleted

## 2013-06-03 ENCOUNTER — Other Ambulatory Visit: Payer: Self-pay | Admitting: Internal Medicine

## 2013-06-03 MED ORDER — INSULIN GLARGINE 100 UNIT/ML SOLOSTAR PEN: 35.0000 [IU] | PEN_INJECTOR | Freq: Two times a day (BID) | SUBCUTANEOUS | Status: DC

## 2013-06-15 ENCOUNTER — Encounter: Payer: Self-pay | Admitting: Family Medicine

## 2013-06-15 ENCOUNTER — Ambulatory Visit (INDEPENDENT_AMBULATORY_CARE_PROVIDER_SITE_OTHER): Payer: BC Managed Care – PPO | Admitting: Family Medicine

## 2013-06-15 VITALS — BP 136/80 | HR 75 | Temp 97.9°F | Ht 70.0 in | Wt 300.0 lb

## 2013-06-15 DIAGNOSIS — E1140 Type 2 diabetes mellitus with diabetic neuropathy, unspecified: Secondary | ICD-10-CM

## 2013-06-15 DIAGNOSIS — E785 Hyperlipidemia, unspecified: Secondary | ICD-10-CM

## 2013-06-15 DIAGNOSIS — E1142 Type 2 diabetes mellitus with diabetic polyneuropathy: Secondary | ICD-10-CM

## 2013-06-15 DIAGNOSIS — E1149 Type 2 diabetes mellitus with other diabetic neurological complication: Secondary | ICD-10-CM

## 2013-06-15 DIAGNOSIS — IMO0002 Reserved for concepts with insufficient information to code with codable children: Secondary | ICD-10-CM

## 2013-06-15 DIAGNOSIS — D638 Anemia in other chronic diseases classified elsewhere: Secondary | ICD-10-CM

## 2013-06-15 DIAGNOSIS — N184 Chronic kidney disease, stage 4 (severe): Secondary | ICD-10-CM

## 2013-06-15 DIAGNOSIS — I5030 Unspecified diastolic (congestive) heart failure: Secondary | ICD-10-CM

## 2013-06-15 DIAGNOSIS — N058 Unspecified nephritic syndrome with other morphologic changes: Secondary | ICD-10-CM

## 2013-06-15 DIAGNOSIS — I10 Essential (primary) hypertension: Secondary | ICD-10-CM

## 2013-06-15 DIAGNOSIS — E1165 Type 2 diabetes mellitus with hyperglycemia: Principal | ICD-10-CM

## 2013-06-15 DIAGNOSIS — E1129 Type 2 diabetes mellitus with other diabetic kidney complication: Secondary | ICD-10-CM

## 2013-06-15 MED ORDER — METOPROLOL SUCCINATE ER 25 MG PO TB24: 25.0000 mg | ORAL_TABLET | Freq: Every day | ORAL | Status: DC

## 2013-06-15 MED ORDER — FUROSEMIDE 40 MG PO TABS: 40.0000 mg | ORAL_TABLET | Freq: Two times a day (BID) | ORAL | Status: DC

## 2013-06-15 MED ORDER — INSULIN PEN NEEDLE 31G X 8 MM MISC: Status: DC

## 2013-06-15 MED ORDER — FERROUS SULFATE 325 (65 FE) MG PO TBEC: 325.0000 mg | DELAYED_RELEASE_TABLET | Freq: Two times a day (BID) | ORAL | Status: DC

## 2013-06-15 MED ORDER — ATORVASTATIN CALCIUM 20 MG PO TABS: 20.0000 mg | ORAL_TABLET | Freq: Every day | ORAL | Status: DC

## 2013-06-15 NOTE — Progress Notes (Signed)
.hkeb No chief complaint on file.   HPI:  Follow up:  1-5: HTN, Dyslipidemia, CAD, CdCHF, Obesity: -meds: lisinopril 10mg , toprol xl 25mg , lasix 40mg , lipitor 20mg  -lifestyle: walks two days per week, she reports working on more veggies and fruit, reports she is going to start watching her sweets and carbs -denies: CP, SOB, palpitation, increased welling -thinks BP up related to lots of sodium the last few days  6:DM with complication/CRI: -followed by endocrinology and nephrology - unfortunately worsened -reviewed recent endocrine notes - appears pt had quit exercising and did not bring BS log to appointment, insulin increased and lifestyle recs advised, appreciate recs -eye appointment: early this year -reports nephrologist at [facility] increased lisinopril -has follow in a few weeks  Venous Stasis Derm/Venous insuff: -advised compression stockings, elevation and good skin care in the past -she reports:wears compression stockings sometimes, seeing dermatologist  Anemia of chronic disease: -on iron  ROS: See pertinent positives and negatives per HPI.  Past Medical History  Diagnosis Date  . Diabetes mellitus   . Hypertension   . Obesity   . Hyperlipidemia   . Peripheral arterial disease? Eval by vascular 2013 with no LE PAD and advised to stop pletal   . Diabetes mellitus with renal manifestations, uncontrolled 01/09/2012  . Diabetic neuropathy 01/09/2012  . Venous stasis of lower extremity 03/10/2012  . Diastolic heart failure - stage 1 per review of echo results from 02/2010 01/15/2012  . At risk for heart disease - +stres test 2012 followed by [facility], [place] with neg cardiac cath 03/2010 (non-obstructive CAD) per review of records, echo 02/2010 normal LVF 01/15/2012    Past Surgical History  Procedure Laterality Date  . Toe amputation  2008  . Eye surgery      Family History  Problem Relation Age of Onset  . Obesity Other   . Hypertension Other   .  Hyperlipidemia Other   . Stroke Other   . Heart attack Other   . Diabetes Mother   . Hyperlipidemia Mother   . Hypertension Mother   . Heart attack Mother   . Stroke Mother   . Diabetes Sister   . Hypertension Brother   . Diabetes Sister   . Diabetes Sister   . Diabetes Sister     History   Social History  . Marital Status: Married    Spouse Name: N/A    Number of Children: N/A  . Years of Education: N/A   Social History Main Topics  . Smoking status: Former Smoker    Types: Cigarettes    Quit date: 02/20/1999  . Smokeless tobacco: Never Used  . Alcohol Use: Yes  . Drug Use: Yes  . Sexual Activity: None   Other Topics Concern  . None   Social History Narrative  . None    Current outpatient prescriptions:atorvastatin (LIPITOR) 20 MG tablet, Take 1 tablet (20 mg total) by mouth daily., Disp: 90 tablet, Rfl: 3;  ferrous sulfate 325 (65 FE) MG EC tablet, Take 1 tablet (325 mg total) by mouth 2 (two) times daily with a meal., Disp: 60 tablet, Rfl: 3;  furosemide (LASIX) 40 MG tablet, Take 1 tablet (40 mg total) by mouth 2 (two) times daily., Disp: 90 tablet, Rfl: 3 glucose blood (FREESTYLE LITE) test strip, 1 each by Other route as needed. , Disp: , Rfl: ;  Insulin Glargine (LANTUS SOLOSTAR) 100 UNIT/ML Solostar Pen, Inject 35 Units into the skin 2 (two) times daily., Disp: 5 pen,  Rfl: 3;  insulin lispro (HUMALOG) 100 UNIT/ML injection, Inject up to 40 units a day as advised - pens, Disp: 15 mL, Rfl: 11;  Insulin Pen Needle (B-D ULTRAFINE III SHORT PEN) 31G X 8 MM MISC, Use as directed., Disp: 100 each, Rfl: 5 lisinopril (PRINIVIL,ZESTRIL) 5 MG tablet, Take 10 mg by mouth daily. , Disp: , Rfl: ;  metoprolol succinate (TOPROL-XL) 25 MG 24 hr tablet, Take 1 tablet (25 mg total) by mouth daily., Disp: 90 tablet, Rfl: 3;  Vitamin D, Ergocalciferol, (DRISDOL) 50000 UNITS CAPS, Take 1 Units by mouth every 30 (thirty) days. , Disp: , Rfl:   EXAM:  Filed Vitals:   06/15/13 0855  BP:  136/80  Pulse: 75  Temp: 97.9 F (36.6 C)    Body mass index is 43.05 kg/(m^2).  GENERAL: vitals reviewed and listed above, alert, oriented, appears well hydrated and in no acute distress  HEENT: atraumatic, conjunttiva clear, no obvious abnormalities on inspection of external nose and ears  NECK: no obvious masses on inspection  LUNGS: clear to auscultation bilaterally, no wheezes, rales or rhonchi, good air movement  CV: HRRR, no peripheral edema  MS: moves all extremities without noticeable abnormality  PSYCH: pleasant and cooperative, no obvious depression or anxiety  ASSESSMENT AND PLAN:  Discussed the following assessment and plan:  Diabetes mellitus with renal manifestations, uncontrolled - Plan: Insulin Pen Needle (B-D ULTRAFINE III SHORT PEN) 31G X 8 MM MISC  Hypertension - Plan: metoprolol succinate (TOPROL-XL) 25 MG 24 hr tablet, furosemide (LASIX) 40 MG tablet  Diabetic neuropathy  Hyperlipidemia - Plan: atorvastatin (LIPITOR) 20 MG tablet  Chronic kidney disease (CKD), stage IV (severe)  Diastolic heart failure - stage 1 per review of echo results from 02/2010 - Plan: furosemide (LASIX) 40 MG tablet  Severe obesity (BMI >= 40)  Anemia of chronic disease - Plan: ferrous sulfate 325 (65 FE) MG EC tablet  -stressed importance of lifestyle and discussed options for weight management -advised close monitoring of home BS and advised she always bring BS logs to all doctor appts, f/u with endocrinologist for diabetes -follow up 3 months with physical -Patient advised to return or notify a doctor immediately if symptoms worsen or persist or new concerns arise.  Patient Instructions  -follow up with your diabetes doctor and your kidney doctor  -follow up in 3 months for you physical exam and follow up - make sure to arrive 15 minutes prior for you nurse visit or you may be asked to reschedule     Lucretia Kern

## 2013-06-15 NOTE — Patient Instructions (Signed)
-  follow up with your diabetes doctor and your kidney doctor  -follow up in 3 months for you physical exam and follow up - make sure to arrive 15 minutes prior for you nurse visit or you may be asked to reschedule

## 2013-06-15 NOTE — Progress Notes (Signed)
Pre visit review using our clinic review tool, if applicable. No additional management support is needed unless otherwise documented below in the visit note. 

## 2013-06-28 ENCOUNTER — Other Ambulatory Visit: Payer: Self-pay | Admitting: Family Medicine

## 2013-07-02 ENCOUNTER — Ambulatory Visit: Payer: BC Managed Care – PPO | Admitting: Internal Medicine

## 2013-07-18 ENCOUNTER — Other Ambulatory Visit: Payer: Self-pay | Admitting: Family Medicine

## 2013-09-14 ENCOUNTER — Encounter: Payer: Self-pay | Admitting: Family Medicine

## 2013-09-14 ENCOUNTER — Ambulatory Visit (INDEPENDENT_AMBULATORY_CARE_PROVIDER_SITE_OTHER): Payer: BC Managed Care – PPO | Admitting: Family Medicine

## 2013-09-14 VITALS — BP 122/82 | HR 68 | Temp 98.0°F | Ht 70.25 in | Wt 302.0 lb

## 2013-09-14 DIAGNOSIS — N184 Chronic kidney disease, stage 4 (severe): Secondary | ICD-10-CM

## 2013-09-14 DIAGNOSIS — E1149 Type 2 diabetes mellitus with other diabetic neurological complication: Secondary | ICD-10-CM

## 2013-09-14 DIAGNOSIS — E1129 Type 2 diabetes mellitus with other diabetic kidney complication: Secondary | ICD-10-CM

## 2013-09-14 DIAGNOSIS — I878 Other specified disorders of veins: Secondary | ICD-10-CM

## 2013-09-14 DIAGNOSIS — I872 Venous insufficiency (chronic) (peripheral): Secondary | ICD-10-CM

## 2013-09-14 DIAGNOSIS — IMO0002 Reserved for concepts with insufficient information to code with codable children: Secondary | ICD-10-CM

## 2013-09-14 DIAGNOSIS — D638 Anemia in other chronic diseases classified elsewhere: Secondary | ICD-10-CM

## 2013-09-14 DIAGNOSIS — Z9189 Other specified personal risk factors, not elsewhere classified: Secondary | ICD-10-CM

## 2013-09-14 DIAGNOSIS — I10 Essential (primary) hypertension: Secondary | ICD-10-CM

## 2013-09-14 DIAGNOSIS — E1165 Type 2 diabetes mellitus with hyperglycemia: Secondary | ICD-10-CM

## 2013-09-14 DIAGNOSIS — Z789 Other specified health status: Secondary | ICD-10-CM

## 2013-09-14 DIAGNOSIS — E785 Hyperlipidemia, unspecified: Secondary | ICD-10-CM

## 2013-09-14 DIAGNOSIS — Z0000 Encounter for general adult medical examination without abnormal findings: Secondary | ICD-10-CM

## 2013-09-14 LAB — LIPID PANEL
Cholesterol: 123 mg/dL (ref 0–200)
HDL: 47.4 mg/dL (ref >39.00)
LDL Cholesterol: 60 mg/dL (ref 0–99)
NonHDL: 75.6
Total CHOL/HDL Ratio: 3
Triglycerides: 78 mg/dL (ref 0.0–149.0)
VLDL: 15.6 mg/dL (ref 0.0–40.0)

## 2013-09-14 LAB — BASIC METABOLIC PANEL
BUN: 23 mg/dL (ref 6–23)
CO2: 27 meq/L (ref 19–32)
Calcium: 8.9 mg/dL (ref 8.4–10.5)
Chloride: 108 mEq/L (ref 96–112)
Creatinine, Ser: 1.7 mg/dL — ABNORMAL HIGH (ref 0.4–1.2)
GFR: 40 mL/min — ABNORMAL LOW (ref >60.00)
Glucose, Bld: 94 mg/dL (ref 70–99)
Potassium: 4.3 mEq/L (ref 3.5–5.1)
SODIUM: 140 meq/L (ref 135–145)

## 2013-09-14 LAB — MICROALBUMIN / CREATININE URINE RATIO
CREATININE, U: 145.1 mg/dL
MICROALB/CREAT RATIO: 2.4 mg/g (ref 0.0–30.0)
Microalb, Ur: 3.5 mg/dL — ABNORMAL HIGH (ref 0.0–1.9)

## 2013-09-14 LAB — HEMOGLOBIN A1C: HEMOGLOBIN A1C: 8.5 % — AB (ref 4.6–6.5)

## 2013-09-14 MED ORDER — LISINOPRIL 5 MG PO TABS: 10.0000 mg | ORAL_TABLET | Freq: Every day | ORAL | Status: DC

## 2013-09-14 NOTE — Patient Instructions (Signed)
-  schedule mammogram  -call today to set up follow up with Dr. Cruzita Lederer - improving your diabetes needs to be a top priority  -set up follow up with your kidney doctor  -Vit D3 1000IU daily and calcium 1200mg  total from diet + or - supplement  -We have ordered labs or studies at this visit. It can take up to 1-2 weeks for results and processing. We will contact you with instructions IF your results are abnormal. Normal results will be released to your [facility]. If you have not heard from [place] or can not find your results in [facility] in 2 weeks please contact our office.  -PLEASE SIGN UP FOR MYCHART TODAY   We recommend the following healthy lifestyle measures: - eat a healthy diet consisting of lots of vegetables, fruits, beans, nuts, seeds, healthy meats such as white chicken and fish and whole grains.  - avoid fried foods, fast food, processed foods, sodas, red meet and other fattening foods.  - get a least 150 minutes of aerobic exercise per week.   Follow up in: 4-6 months

## 2013-09-14 NOTE — Progress Notes (Signed)
Pre visit review using our clinic review tool, if applicable. No additional management support is needed unless otherwise documented below in the visit note. 

## 2013-09-14 NOTE — Progress Notes (Signed)
No chief complaint on file.   HPI:  Here for CPE:  -Concerns and/or follow up today:  1-5: HTN, Dyslipidemia, CAD, CdCHF, Obesity:  -meds: lisinopril 10mg , toprol xl 25mg , lasix 40mg , lipitor 20mg   -lifestyle: walks 1 mile per day, she reports working on more veggies and fruit, reports she is going to start watching her sweets and carbs  - but eats hot dogs and hamburger from time to time -denies: CP, SOB, palpitation, increased welling    6:DM with complication/CRI:  -followed by endocrinology and nephrology  -meds: admits misses does of her insulin -reviewed recent endocrine notes  - pt did not follow up as planned per note -eye appointment: early this year  -reports nephrologist at [facility] increased lisinopril  -home BS: elevated at times when misses doses -denies: hypoglycemia, changes in vision, wounds -she reports missed her last nephrology  Venous Stasis Derm/Venous insuff:  -advised compression stockings, elevation and good skin care in the past  -she reports:wears compression stockings sometimes, seeing dermatologist   Anemia of chronic disease:  -on iron -denies: palpitation, bleeding, weakness  -Taking  vitamin D or calcium: no  -Vaccines: UTD  -pap history: 07/08/12 - normal, hpv neg  -FDLMP: n/a  -sexual activity: yes, female partner, no new partners  -wants STI testing: no  -FH breast, colon or ovarian ca: see FH Last mammogram: 10/13/12 Last colon cancer screening: 08/01/11  -Alcohol, Tobacco, drug use: see social history  Review of Systems - no fevers, unintentional weight loss, vision loss, hearing loss, chest pain, sob, hemoptysis, melena, hematochezia, hematuria, genital discharge, changing or concerning skin lesions, bleeding, bruising, loc, thoughts of self harm or SI  Past Medical History  Diagnosis Date  . Diabetes mellitus   . Hypertension   . Obesity   . Hyperlipidemia   . Peripheral arterial disease? Eval by vascular 2013 with no LE PAD  and advised to stop pletal   . Diabetes mellitus with renal manifestations, uncontrolled 01/09/2012  . Diabetic neuropathy 01/09/2012  . Venous stasis of lower extremity 03/10/2012  . Diastolic heart failure - stage 1 per review of echo results from 02/2010 01/15/2012  . At risk for heart disease - +stres test 2012 followed by [facility], [place] with neg cardiac cath 03/2010 (non-obstructive CAD) per review of records, echo 02/2010 normal LVF 01/15/2012    Past Surgical History  Procedure Laterality Date  . Toe amputation  2008  . Eye surgery      Family History  Problem Relation Age of Onset  . Obesity Other   . Hypertension Other   . Hyperlipidemia Other   . Stroke Other   . Heart attack Other   . Diabetes Mother   . Hyperlipidemia Mother   . Hypertension Mother   . Heart attack Mother   . Stroke Mother   . Diabetes Sister   . Hypertension Brother   . Diabetes Sister   . Diabetes Sister   . Diabetes Sister     History   Social History  . Marital Status: Married    Spouse Name: N/A    Number of Children: N/A  . Years of Education: N/A   Social History Main Topics  . Smoking status: Former Smoker    Types: Cigarettes    Quit date: 02/20/1999  . Smokeless tobacco: Never Used  . Alcohol Use: Yes  . Drug Use: Yes  . Sexual Activity: None   Other Topics Concern  . None   Social History Narrative  .  None    Current outpatient prescriptions:atorvastatin (LIPITOR) 20 MG tablet, Take 1 tablet (20 mg total) by mouth daily., Disp: 90 tablet, Rfl: 3;  B-D ULTRAFINE III SHORT PEN 31G X 8 MM MISC, USE AS DIRECTED., Disp: 100 each, Rfl: 3;  CVS IRON 325 (65 FE) MG tablet, TAKE 1 TALBET BY MOUTH TWICE DAILY WITH A MEAL, Disp: 60 tablet, Rfl: 3;  furosemide (LASIX) 40 MG tablet, Take 1 tablet (40 mg total) by mouth 2 (two) times daily., Disp: 90 tablet, Rfl: 3 glucose blood (FREESTYLE LITE) test strip, 1 each by Other route as needed. , Disp: , Rfl: ;  Insulin Glargine  (LANTUS SOLOSTAR) 100 UNIT/ML Solostar Pen, Inject 35 Units into the skin 2 (two) times daily., Disp: 5 pen, Rfl: 3;  insulin lispro (HUMALOG) 100 UNIT/ML injection, Inject up to 40 units a day as advised - pens, Disp: 15 mL, Rfl: 11;  Insulin Pen Needle (B-D ULTRAFINE III SHORT PEN) 31G X 8 MM MISC, Use as directed., Disp: 100 each, Rfl: 5 lisinopril (PRINIVIL,ZESTRIL) 5 MG tablet, Take 2 tablets (10 mg total) by mouth daily., Disp: 90 tablet, Rfl: 3;  metoprolol succinate (TOPROL-XL) 25 MG 24 hr tablet, Take 1 tablet (25 mg total) by mouth daily., Disp: 90 tablet, Rfl: 3;  Vitamin D, Ergocalciferol, (DRISDOL) 50000 UNITS CAPS, Take 1 Units by mouth every 30 (thirty) days. , Disp: , Rfl:   EXAM:  Filed Vitals:   09/14/13 0840  BP: 122/82  Pulse: 68  Temp: 98 F (36.7 C)    GENERAL: vitals reviewed and listed below, alert, oriented, appears well hydrated and in no acute distress  HEENT: head atraumatic, PERRLA, normal appearance of eyes, ears, nose and mouth. moist mucus membranes.  NECK: supple, no masses or lymphadenopathy  LUNGS: clear to auscultation bilaterally, no rales, rhonchi or wheeze  CV: HRRR, no peripheral edema or cyanosis, normal pedal pulses  BREAST: deferred  ABDOMEN: bowel sounds normal, soft, non tender to palpation, no masses, no rebound or guarding  GU: deferred  RECTAL: refused  SKIN: no rash or abnormal lesions  MS: normal gait, moves all extremities normally  NEURO: CN II-XII grossly intact, normal muscle strength and sensation to light touch on extremities  PSYCH: normal affect, pleasant and cooperative  ASSESSMENT AND PLAN:  Discussed the following assessment and plan:  Visit for preventive health examination  Hyperlipidemia - Plan: Lipid Panel  Diabetes mellitus with renal manifestations, uncontrolled - Plan: Hemoglobin A1c, Microalbumin/Creatinine Ratio, Urine, lisinopril (PRINIVIL,ZESTRIL) 5 MG tablet  Other diabetic neurological  complication associated with type 2 diabetes mellitus  Essential hypertension - Plan: Basic metabolic panel, lisinopril (PRINIVIL,ZESTRIL) 5 MG tablet  Chronic kidney disease (CKD), stage IV (severe) - Plan: lisinopril (PRINIVIL,ZESTRIL) 5 MG tablet  At risk for heart disease - +stres test 2012 followed by [facility], [place] with neg cardiac cath 03/2010 (non-obstructive CAD) per review of records, echo 02/2010 normal LVF  Anemia of chronic disease  Severe obesity (BMI >= 40)  Venous stasis of lower extremity   -Discussed and advised all [place] preventive services health task force level A and B recommendations for age, sex and risks.  -Advised at least 150 minutes of exercise per week and a healthy diet low in saturated fats and sweets and consisting of fresh fruits and vegetables, lean meats such as fish and white chicken and whole grains.  -stressed importance of strict adherence to medications, follow up, treatment recommendations and risks associated with uncontrolled diabetes  -  FASTING labs - will send to endocrine and nephrology, studies and vaccines per orders this encounter  Orders Placed This Encounter  Procedures  . Hemoglobin A1c  . Basic metabolic panel  . Microalbumin/Creatinine Ratio, Urine  . Lipid Panel    Patient Instructions  -schedule mammogram  -call today to set up follow up with Dr. Cruzita Lederer - improving your diabetes needs to be a top priority  -set up follow up with your kidney doctor  -Vit D3 1000IU daily and calcium 1200mg  total from diet + or - supplement  -We have ordered labs or studies at this visit. It can take up to 1-2 weeks for results and processing. We will contact you with instructions IF your results are abnormal. Normal results will be released to your [facility]. If you have not heard from [place] or can not find your results in [facility] in 2 weeks please contact our office.  -PLEASE SIGN UP FOR MYCHART TODAY   We recommend the following  healthy lifestyle measures: - eat a healthy diet consisting of lots of vegetables, fruits, beans, nuts, seeds, healthy meats such as white chicken and fish and whole grains.  - avoid fried foods, fast food, processed foods, sodas, red meet and other fattening foods.  - get a least 150 minutes of aerobic exercise per week.   Follow up in: 4-6 months         Patient advised to return to clinic immediately if symptoms worsen or persist or new concerns.   Return in about 4 months (around 01/15/2014) for follow up.  Colin Benton R.

## 2013-10-01 ENCOUNTER — Telehealth: Payer: Self-pay

## 2013-10-01 NOTE — Telephone Encounter (Signed)
Diabetic Bundle Lvom for pt to call back and schedule appointment with Dr. Cruzita Lederer.

## 2013-10-13 ENCOUNTER — Other Ambulatory Visit: Payer: Self-pay | Admitting: Internal Medicine

## 2013-10-27 ENCOUNTER — Ambulatory Visit: Payer: BC Managed Care – PPO | Admitting: Internal Medicine

## 2013-11-11 ENCOUNTER — Other Ambulatory Visit: Payer: Self-pay

## 2013-11-11 DIAGNOSIS — Z1231 Encounter for screening mammogram for malignant neoplasm of breast: Secondary | ICD-10-CM

## 2013-12-01 ENCOUNTER — Encounter: Payer: Self-pay | Admitting: Internal Medicine

## 2013-12-01 ENCOUNTER — Telehealth: Payer: Self-pay | Admitting: Internal Medicine

## 2013-12-01 ENCOUNTER — Ambulatory Visit (INDEPENDENT_AMBULATORY_CARE_PROVIDER_SITE_OTHER): Payer: BC Managed Care – PPO | Admitting: Internal Medicine

## 2013-12-01 ENCOUNTER — Other Ambulatory Visit: Payer: Self-pay

## 2013-12-01 ENCOUNTER — Ambulatory Visit
Admission: RE | Admit: 2013-12-01 | Discharge: 2013-12-01 | Disposition: A | Discharge status: Home / Self Care | Payer: BC Managed Care – PPO | Source: Ambulatory Visit

## 2013-12-01 VITALS — BP 122/62 | HR 72 | Temp 98.3°F | Resp 16 | Ht 70.5 in | Wt 299.6 lb

## 2013-12-01 DIAGNOSIS — E1129 Type 2 diabetes mellitus with other diabetic kidney complication: Secondary | ICD-10-CM

## 2013-12-01 DIAGNOSIS — IMO0002 Reserved for concepts with insufficient information to code with codable children: Secondary | ICD-10-CM

## 2013-12-01 DIAGNOSIS — Z1231 Encounter for screening mammogram for malignant neoplasm of breast: Secondary | ICD-10-CM

## 2013-12-01 DIAGNOSIS — Z23 Encounter for immunization: Secondary | ICD-10-CM

## 2013-12-01 DIAGNOSIS — E1165 Type 2 diabetes mellitus with hyperglycemia: Secondary | ICD-10-CM

## 2013-12-01 MED ORDER — INSULIN LISPRO 100 UNIT/ML ~~LOC~~ SOLN: SUBCUTANEOUS | Status: DC

## 2013-12-01 MED ORDER — INSULIN GLARGINE 300 UNIT/ML ~~LOC~~ SOPN: 70.0000 [IU] | PEN_INJECTOR | Freq: Every day | SUBCUTANEOUS | Status: DC

## 2013-12-01 NOTE — Progress Notes (Signed)
Subjective:     Patient ID: Savannah Crawford, female   DOB: Mar 13, 1955, 58 y.o.   MRN: [id number]  Diabetes   [name] is a 58 y.o. woman returning for f/u for DM2, insulin-dependent, uncontrolled, with complications (CAD, CKD, diabetes neuropathy, diabetic retinopathy, PVD, amputated toe-R). Last visit ~6.5 mo ago.   Last hemoglobin A1c was: Lab Results  Component Value Date   HGBA1C 8.5* 09/14/2013   HGBA1C 9.5* 02/24/2013   HGBA1C 8.9* 10/13/2012   She is on a regimen of: - Lantus 45 >> 40 and 35 units bid - pen (she alternates, depending on the sugars) - Humalog mealtime: - am: 25 units - dinner: 20 or 25 units  She does not use insulin for lunch (burger, salad, fruit) - Humalog Sliding scale: - 150- 165: + 1 unit  - 166- 180: + 2 units  - 181- 195: + 3 units  - 196- 210: + 4 units  - >210: + 5 units  She was on metformin in the past, but stopped due to chronic kidney disease.   She checks her sugars 2x a day - sugars ~same as before. She again does not bring her log.  - am: 104-167 >> 110-157 >> 98-170 >> 98-153 >> 98-150 - prelunch: 105 (it is difficult for her to check since she is at work) >> 98-120 >> n/c  - predinner: 87-195 >> 100-180 >> 170-220 >> 170-180 (occasionally 200 less than once a week) >> 178-190 - bedtime: 187-310 (highest when checked after eating - if forgets insulin - maybe once a week) >> n/c No lows. She has hypoglycemia awareness. Highest sugar 212 since last visit.   She is also seen by nephrology for her chronic kidney disease.  Lab Results  Component Value Date   BUN 23 09/14/2013   CREATININE 1.7* 09/14/2013  She is on Lisinopril 5 bid.  Her cholesterol levels are at goal: Lab Results  Component Value Date   CHOL 123 09/14/2013   HDL 47.40 09/14/2013   LDLCALC 60 09/14/2013   TRIG 78.0 09/14/2013   CHOLHDL 3 09/14/2013  She is on Lipitor 20.  She was going to the gym regularly (not anymore). Has DR, last eye exam in 02/2013. No DR. She denies  blurry vision, increased thirst, or urination. Has numbness and tingling in feet. She sees podiatry.   She also has a history of nonobstructive CAD, with the positive stress test but a negative cardiac cath 2012, and also has diastolic heart failure. She has hypertension, hyperlipidemia, venous stasis, vitamin D deficiency.  I reviewed pt's medications, allergies, PMH, social hx, family hx and no changes required, except as above.   Review of Systems Constitutional: no weight gain/loss, no fatigue, no subjective hyperthermia/hypothermia Eyes: no blurry vision, no xerophthalmia ENT: no sore throat, no nodules palpated in throat, no dysphagia/odynophagia, no hoarseness Cardiovascular: no CP/SOB/palpitations/leg swelling Respiratory: no cough/SOB Gastrointestinal: no N/V/D/C Musculoskeletal: no muscle/joint aches Skin: no rashes Neurological: no tremors/numbness/tingling/dizziness   Objective:   Physical Exam BP 122/62  Pulse 72  Temp(Src) 98.3 F (36.8 C)  Resp 16  Ht 5' 10.5" (1.791 m)  Wt 299 lb 9.6 oz (135.898 kg)  BMI 42.37 kg/m2  SpO2 96% Wt Readings from Last 3 Encounters:  12/01/13 299 lb 9.6 oz (135.898 kg)  09/14/13 302 lb (136.986 kg)  06/15/13 300 lb (136.079 kg)   Constitutional: overweight, in NAD Eyes: PERRLA, EOMI, no exophthalmos ENT: moist mucous membranes, no thyromegaly, no cervical lymphadenopathy Cardiovascular: RRR, No  MRG Respiratory: CTA B Gastrointestinal: abdomen soft, NT, ND, BS+ Musculoskeletal: no deformities, strength intact in all 4 Skin: moist, warm Neurological: no tremor with outstretched hands, DTR normal in all 4  Assessment:     1. DM2, insulin-dependent, uncontrolled, with complications - nonobstructive CAD - CKD - diabetes neuropathy - diabetic retinopathy - PVD - R amputated toe    Plan:     Pt with still increasing sugars throughout the day >> we discussed about adding mealtime insulin with lunch, too. We will also switch  to [facility] as she is on a larger dose of Lantus, so she can inject basal insulin only once a day. - I advised her to: Patient Instructions  - Please stop Lantus and start Toujeo 70 units daily at bedtime - add a Humalog dose before lunch, too: - breakfast: 25 units - lunch: 10 units for a salad   15 units for a sandwich - dinner: 25 units  - continue Humalog Sliding scale: - 150- 165: + 1 unit  - 166- 180: + 2 units  - 181- 195: + 3 units  - 196- 210: + 4 units  - >210: + 5 units  Please return in 1.5 month with your sugar log.  - had the flu vaccine vaccine today - up to date with eye exams - will check a HbA1C at next visit - RTC in 1.5 mo with sugar log.

## 2013-12-01 NOTE — Patient Instructions (Signed)
-   Please stop Lantus and start Toujeo 70 units daily at bedtime - add a Humalog dose before lunch, too: - breakfast: 25 units - lunch: 10 units for a salad   15 units for a sandwich - dinner: 25 units  - continue Humalog Sliding scale: - 150- 165: + 1 unit  - 166- 180: + 2 units  - 181- 195: + 3 units  - 196- 210: + 4 units  - >210: + 5 units  Please return in 1.5 month with your sugar log.

## 2013-12-01 NOTE — Telephone Encounter (Signed)
Called and spoke with pharmacy. Toujeo comes in boxes of 3 pens. Updated script sent for 6 pens to cover the pt for the 30  Days.

## 2013-12-01 NOTE — Telephone Encounter (Signed)
The toujeo insulin has descrepencies please call pharmacy to clarify # [phone]

## 2013-12-03 ENCOUNTER — Other Ambulatory Visit: Payer: Self-pay | Admitting: Family Medicine

## 2013-12-03 DIAGNOSIS — R928 Other abnormal and inconclusive findings on diagnostic imaging of breast: Secondary | ICD-10-CM

## 2013-12-15 ENCOUNTER — Ambulatory Visit
Admission: RE | Admit: 2013-12-15 | Discharge: 2013-12-15 | Disposition: A | Discharge status: Home / Self Care | Payer: BC Managed Care – PPO | Source: Ambulatory Visit | Attending: Family Medicine | Admitting: Family Medicine

## 2013-12-15 DIAGNOSIS — R928 Other abnormal and inconclusive findings on diagnostic imaging of breast: Secondary | ICD-10-CM

## 2013-12-16 ENCOUNTER — Other Ambulatory Visit: Payer: BC Managed Care – PPO

## 2013-12-21 ENCOUNTER — Telehealth: Payer: Self-pay | Admitting: Family Medicine

## 2013-12-21 DIAGNOSIS — I10 Essential (primary) hypertension: Secondary | ICD-10-CM

## 2014-01-13 ENCOUNTER — Telehealth: Payer: Self-pay | Admitting: Internal Medicine

## 2014-01-13 ENCOUNTER — Ambulatory Visit: Payer: BC Managed Care – PPO | Admitting: Internal Medicine

## 2014-01-13 NOTE — Telephone Encounter (Signed)
We could have discussed this at today's visit but she no showed. Let's switch back to Lantus same dose for now and reschedule.

## 2014-01-13 NOTE — Telephone Encounter (Signed)
Please read note below and advise.  

## 2014-01-13 NOTE — Telephone Encounter (Signed)
Patient states the Savannah Crawford is not working blood sugars are running high  Dizziness, off balance   11/25 this am  163  Patient was doing better with the Lantus    Please advise

## 2014-01-13 NOTE — Telephone Encounter (Signed)
Called pt and advised her per Dr Arman Filter note. Advised pt to re-schedule her appt.

## 2014-01-19 ENCOUNTER — Telehealth: Payer: Self-pay | Admitting: Internal Medicine

## 2014-01-19 NOTE — Telephone Encounter (Signed)
Patient refill Lantus 40 to 45 unit 2 x day

## 2014-01-20 MED ORDER — INSULIN GLARGINE 100 UNIT/ML SOLOSTAR PEN: PEN_INJECTOR | SUBCUTANEOUS | Status: DC

## 2014-01-20 NOTE — Telephone Encounter (Signed)
Done

## 2014-02-05 ENCOUNTER — Other Ambulatory Visit: Payer: Self-pay | Admitting: Family Medicine

## 2014-02-05 ENCOUNTER — Other Ambulatory Visit: Payer: Self-pay | Admitting: Internal Medicine

## 2014-02-10 ENCOUNTER — Ambulatory Visit: Payer: BC Managed Care – PPO | Admitting: Internal Medicine

## 2014-02-26 ENCOUNTER — Other Ambulatory Visit: Payer: Self-pay | Admitting: Family Medicine

## 2014-03-02 ENCOUNTER — Encounter: Payer: Self-pay | Admitting: Family Medicine

## 2014-03-02 ENCOUNTER — Ambulatory Visit (INDEPENDENT_AMBULATORY_CARE_PROVIDER_SITE_OTHER): Payer: BLUE CROSS/BLUE SHIELD | Admitting: Family Medicine

## 2014-03-02 VITALS — BP 140/82 | HR 77 | Temp 98.9°F | Ht 70.5 in | Wt 296.4 lb

## 2014-03-02 DIAGNOSIS — J069 Acute upper respiratory infection, unspecified: Secondary | ICD-10-CM

## 2014-03-02 MED ORDER — BENZONATATE 100 MG PO CAPS: 100.0000 mg | ORAL_CAPSULE | Freq: Two times a day (BID) | ORAL | Status: DC | PRN

## 2014-03-02 NOTE — Progress Notes (Signed)
HPI:  URI: -started: about 4-5 days ago -symptoms:nasal congestion, sore throat, cough, PND, subjective fever initially, body aches initially -denies: objective fever, SOB, NVD, tooth pain -has tried: OTC cough medication -sick contacts/travel/risks: denies flu exposure, tick exposure or or Ebola risks  ROS: See pertinent positives and negatives per HPI.  Past Medical History  Diagnosis Date  . Diabetes mellitus   . Hypertension   . Obesity   . Hyperlipidemia   . Peripheral arterial disease? Eval by vascular 2013 with no LE PAD and advised to stop pletal   . Diabetes mellitus with renal manifestations, uncontrolled 01/09/2012  . Diabetic neuropathy 01/09/2012  . Venous stasis of lower extremity 03/10/2012  . Diastolic heart failure - stage 1 per review of echo results from 02/2010 01/15/2012  . At risk for heart disease - +stres test 2012 followed by [facility], [place] with neg cardiac cath 03/2010 (non-obstructive CAD) per review of records, echo 02/2010 normal LVF 01/15/2012    Past Surgical History  Procedure Laterality Date  . Toe amputation  2008  . Eye surgery      Family History  Problem Relation Age of Onset  . Obesity Other   . Hypertension Other   . Hyperlipidemia Other   . Stroke Other   . Heart attack Other   . Diabetes Mother   . Hyperlipidemia Mother   . Hypertension Mother   . Heart attack Mother   . Stroke Mother   . Diabetes Sister   . Hypertension Brother   . Diabetes Sister   . Diabetes Sister   . Diabetes Sister     History   Social History  . Marital Status: Married    Spouse Name: N/A    Number of Children: N/A  . Years of Education: N/A   Social History Main Topics  . Smoking status: Former Smoker    Types: Cigarettes    Quit date: 02/20/1999  . Smokeless tobacco: Never Used  . Alcohol Use: Yes  . Drug Use: Yes  . Sexual Activity: None   Other Topics Concern  . None   Social History Narrative    Current  outpatient prescriptions: atorvastatin (LIPITOR) 20 MG tablet, Take 1 tablet (20 mg total) by mouth daily., Disp: 90 tablet, Rfl: 3;  B-D ULTRAFINE III SHORT PEN 31G X 8 MM MISC, USE AS DIRECTED., Disp: 100 each, Rfl: 3;  CVS IRON 325 (65 FE) MG tablet, TAKE 1 TALBET BY MOUTH TWICE DAILY WITH A MEAL, Disp: 60 tablet, Rfl: 3;  furosemide (LASIX) 40 MG tablet, Take 1 tablet (40 mg total) by mouth 2 (two) times daily., Disp: 90 tablet, Rfl: 3 glucose blood (FREESTYLE LITE) test strip, 1 each by Other route as needed. , Disp: , Rfl: ;  Insulin Glargine (LANTUS SOLOSTAR) 100 UNIT/ML Solostar Pen, Inject 40-45 units into the skin 2 times daily., Disp: 10 pen, Rfl: 2;  insulin lispro (HUMALOG) 100 UNIT/ML injection, Inject up to 80 units daily as advised. Pens, please., Disp: 30 mL, Rfl: 2 Insulin Pen Needle (B-D ULTRAFINE III SHORT PEN) 31G X 8 MM MISC, Use as directed., Disp: 100 each, Rfl: 5;  lisinopril (PRINIVIL,ZESTRIL) 5 MG tablet, Take 2 tablets (10 mg total) by mouth daily., Disp: 90 tablet, Rfl: 3;  metoprolol succinate (TOPROL-XL) 25 MG 24 hr tablet, Take 1 tablet (25 mg total) by mouth daily., Disp: 90 tablet, Rfl: 3 Vitamin D, Ergocalciferol, (DRISDOL) 50000 UNITS CAPS, Take 1 Units by mouth every 30 (thirty)  days. , Disp: , Rfl: ;  benzonatate (TESSALON) 100 MG capsule, Take 1 capsule (100 mg total) by mouth 2 (two) times daily as needed for cough., Disp: 20 capsule, Rfl: 0  EXAM:  Filed Vitals:   03/02/14 0918  BP: 140/82  Pulse: 77  Temp: 98.9 F (37.2 C)    Body mass index is 41.91 kg/(m^2).  GENERAL: vitals reviewed and listed above, alert, oriented, appears well hydrated and in no acute distress  HEENT: atraumatic, conjunttiva clear, no obvious abnormalities on inspection of external nose and ears, normal appearance of ear canals and TMs, clear nasal congestion, mild post oropharyngeal erythema with PND, no tonsillar edema or exudate, no sinus TTP  NECK: no obvious masses on  inspection  LUNGS: clear to auscultation bilaterally, no wheezes, rales or rhonchi, good air movement  CV: HRRR, no peripheral edema  MS: moves all extremities without noticeable abnormality  PSYCH: pleasant and cooperative, no obvious depression or anxiety  ASSESSMENT AND PLAN:  Discussed the following assessment and plan:  Acute upper respiratory infection  -given HPI and exam findings today, a serious infection or illness is unlikely. We discussed potential etiologies, with VURI being most likely, and advised supportive care and monitoring. We discussed treatment side effects, likely course, antibiotic misuse, transmission, and signs of developing a serious illness. -of course, we advised to return or notify a doctor immediately if symptoms worsen or persist or new concerns arise.  On review of chart, she unfortunately no showed her last endocrine appt. Stressed importance of keeping upcoming appointment and staying on top of her diabetes as this is her most pressing health issues at this time and advised it impacting all of her health and well being. Stressed importance of diet and exercise.   Patient Instructions  INSTRUCTIONS FOR UPPER RESPIRATORY INFECTION:  -plenty of rest and fluids  -nasal saline wash 2-3 times daily (use prepackaged nasal saline or bottled/distilled water if making your own)   -can use AFRIN nasal spray for drainage and nasal congestion - but do NOT use longer then 3-4 days  -can use tylenol or ibuprofen as directed for aches and sorethroat  -in the winter time, using a humidifier at night is helpful (please follow cleaning instructions)  -if you are taking a cough medication - use only as directed, may also try a teaspoon of honey to coat the throat and throat lozenges  -for sore throat, salt water gargles can help  -follow up if you have fevers, facial pain, tooth pain, difficulty breathing or are worsening or not getting better in 5-7  days      Galadriel Shroff R.

## 2014-03-02 NOTE — Progress Notes (Signed)
Pre visit review using our clinic review tool, if applicable. No additional management support is needed unless otherwise documented below in the visit note. 

## 2014-03-02 NOTE — Patient Instructions (Signed)

## 2014-03-03 ENCOUNTER — Encounter: Payer: Self-pay | Admitting: Internal Medicine

## 2014-03-03 ENCOUNTER — Ambulatory Visit (INDEPENDENT_AMBULATORY_CARE_PROVIDER_SITE_OTHER): Payer: BLUE CROSS/BLUE SHIELD | Admitting: Internal Medicine

## 2014-03-03 VITALS — BP 138/62 | HR 85 | Temp 98.2°F | Resp 12 | Wt 294.8 lb

## 2014-03-03 DIAGNOSIS — E1129 Type 2 diabetes mellitus with other diabetic kidney complication: Secondary | ICD-10-CM

## 2014-03-03 DIAGNOSIS — IMO0002 Reserved for concepts with insufficient information to code with codable children: Secondary | ICD-10-CM

## 2014-03-03 DIAGNOSIS — E1165 Type 2 diabetes mellitus with hyperglycemia: Secondary | ICD-10-CM

## 2014-03-03 LAB — HEMOGLOBIN A1C: HEMOGLOBIN A1C: 8.9 % — AB (ref 4.6–6.5)

## 2014-03-03 MED ORDER — INSULIN LISPRO 100 UNIT/ML ~~LOC~~ SOLN: SUBCUTANEOUS | Status: DC

## 2014-03-03 MED ORDER — INSULIN GLARGINE 100 UNIT/ML SOLOSTAR PEN: PEN_INJECTOR | SUBCUTANEOUS | Status: DC

## 2014-03-03 NOTE — Progress Notes (Signed)
Subjective:     Patient ID: Savannah Crawford, female   DOB: November 09, 1955, 59 y.o.   MRN: [id number]  Diabetes   Savannah Crawford is a 59 y.o. woman returning for f/u for DM2, insulin-dependent, uncontrolled, with complications (CAD, CKD, diabetes neuropathy, diabetic retinopathy, PVD, amputated toe-R). Last visit 3 mo ago.   Last hemoglobin A1c was: Lab Results  Component Value Date   HGBA1C 8.5* 09/14/2013   HGBA1C 9.5* 02/24/2013   HGBA1C 8.9* 10/13/2012   She is on a regimen of: - Lantus (tried Toujeo but highs in am) 40 units 2x a day - Humalog dose: - breakfast: 25 units - lunch (not taking) : 10 units for a salad   15 units for a sandwich - dinner: 25 units  - Humalog Sliding scale: - 150- 165: + 1 unit  - 166- 180: + 2 units  - 181- 195: + 3 units  - 196- 210: + 4 units  - >210: + 5 units   She was on metformin in the past, but stopped due to chronic kidney disease.   She checks her sugars 2x a day. She again does not bring her log or meter.  - am: 104-167 >> 110-157 >> 98-170 >> 98-153 >> 98-150 >> 170-190 on Toujeo, in the last month: 98-145, on Lantus - prelunch: 105 (it is difficult for her to check since she is at work) >> 98-120 >> n/c >> 117-120 - predinner: 170-220 >> 170-180 (occasionally 200 less than once a week) >> 178-190 >> 178-190 (per her report) - bedtime: 187-310 (highest when checked after eating - if forgets insulin - maybe once a week) >> n/c No lows. She has hypoglycemia awareness. Highest sugar 190 since last visit.   She is also seen by nephrology for her chronic kidney disease.  Lab Results  Component Value Date   BUN 23 09/14/2013   CREATININE 1.7* 09/14/2013  She is on Lisinopril 5 bid.  Her cholesterol levels are at goal: Lab Results  Component Value Date   CHOL 123 09/14/2013   HDL 47.40 09/14/2013   LDLCALC 60 09/14/2013   TRIG 78.0 09/14/2013   CHOLHDL 3 09/14/2013  She is on Lipitor 20.  She was going to the gym regularly (not  anymore). Has DR, last eye exam in 02/2013. No DR. She denies blurry vision, increased thirst, or urination. Has numbness and tingling in feet. She sees podiatry.   She also has a history of nonobstructive CAD, with the positive stress test but a negative cardiac cath 2012, and also has diastolic heart failure. She has hypertension, hyperlipidemia, venous stasis, vitamin D deficiency.  I reviewed pt's medications, allergies, PMH, social hx, family hx and no changes required, except as above.   Review of Systems Constitutional: no weight gain/loss, no fatigue, no subjective hyperthermia/hypothermia Eyes: no blurry vision, no xerophthalmia ENT: + sore throat, no nodules palpated in throat, no dysphagia/odynophagia, no hoarseness Cardiovascular: no CP/SOB/palpitations/leg swelling Respiratory: + cough/no SOB/+ wheezing Gastrointestinal: no N/V/D/C Musculoskeletal: + muscle aches/no joint aches Skin: no rashes Neurological: no tremors/numbness/tingling/dizziness, + HA   Objective:   Physical Exam BP 138/62 mmHg  Pulse 85  Temp(Src) 98.2 F (36.8 C) (Oral)  Resp 12  Wt 294 lb 12.8 oz (133.72 kg)  SpO2 96% Wt Readings from Last 3 Encounters:  03/03/14 294 lb 12.8 oz (133.72 kg)  03/02/14 296 lb 6.4 oz (134.446 kg)  12/01/13 299 lb 9.6 oz (135.898 kg)   Constitutional: overweight, in NAD Eyes: PERRLA, EOMI,  no exophthalmos ENT: moist mucous membranes, no thyromegaly, no cervical lymphadenopathy Cardiovascular: RRR, No MRG Respiratory: CTA B Gastrointestinal: abdomen soft, NT, ND, BS+ Musculoskeletal: no deformities, strength intact in all 4 Skin: moist, warm Neurological: no tremor with outstretched hands, DTR normal in all 4  Assessment:     1. DM2, insulin-dependent, uncontrolled, with complications - nonobstructive CAD - CKD - diabetes neuropathy - diabetic retinopathy - PVD - R amputated toe    Plan:     Pt with still increasing sugars throughout the day >> we  added mealtime insulin with lunch at last visit, but she is not taking it >> advised her to start. She did not do well on Toujeo >> switched back to Lantus. Will continue. - I advised her to: Patient Instructions  - Continue Lantus 40 units 2x a day - add a Humalog dose before lunch, too: - breakfast: 25 units - lunch: 10 units for a salad   15 units for a sandwich - dinner: 25 units  - continue Humalog Sliding scale: - 150- 165: + 1 unit  - 166- 180: + 2 units  - 181- 195: + 3 units  - 196- 210: + 4 units  - >210: + 5 units  Please return in 1.5 month with your sugar log.  Please stop at the lab. - had the flu vaccine vaccine this season - up to date with eye exams - will check a HbA1C today - RTC in 1.5 mo with sugar log or meter  Office Visit on 03/03/2014  Component Date Value Ref Range Status  . Hgb A1c MFr Bld 03/03/2014 8.9* 4.6 - 6.5 % Final   Glycemic Control Guidelines for People with Diabetes:Non Diabetic:  <6%Goal of Therapy: <7%Additional Action Suggested:  >8%   HbA1c higher. Please see above.

## 2014-03-03 NOTE — Patient Instructions (Signed)
Patient Instructions  - Continue Lantus 40 units 2x a day - add a Humalog dose before lunch, too: - breakfast: 25 units - lunch: 10 units for a salad   15 units for a sandwich - dinner: 25 units  - continue Humalog Sliding scale: - 150- 165: + 1 unit  - 166- 180: + 2 units  - 181- 195: + 3 units  - 196- 210: + 4 units  - >210: + 5 units  Please return in 1.5 month with your sugar log.  Please stop at the lab.

## 2014-03-20 ENCOUNTER — Other Ambulatory Visit: Payer: Self-pay | Admitting: Family Medicine

## 2014-03-20 ENCOUNTER — Other Ambulatory Visit: Payer: Self-pay | Admitting: Internal Medicine

## 2014-03-26 ENCOUNTER — Other Ambulatory Visit: Payer: Self-pay | Admitting: Family Medicine

## 2014-06-12 ENCOUNTER — Other Ambulatory Visit: Payer: Self-pay | Admitting: Family Medicine

## 2014-07-09 ENCOUNTER — Other Ambulatory Visit: Payer: Self-pay | Admitting: Family Medicine

## 2014-07-12 ENCOUNTER — Other Ambulatory Visit: Payer: Self-pay | Admitting: Family Medicine

## 2014-08-11 ENCOUNTER — Other Ambulatory Visit: Payer: Self-pay | Admitting: Internal Medicine

## 2014-08-12 ENCOUNTER — Other Ambulatory Visit: Payer: Self-pay | Admitting: Family Medicine

## 2014-08-18 ENCOUNTER — Other Ambulatory Visit: Payer: Self-pay | Admitting: Internal Medicine

## 2014-08-22 ENCOUNTER — Telehealth: Payer: Self-pay | Admitting: Family Medicine

## 2014-08-24 NOTE — Telephone Encounter (Signed)
Savannah Crawford,  Please call pt. Assist her in scheduling follow up visit with endocrine - I am very concerned about her diabetes.  Also please have help her schedule a physical with me in July or August (1 year after last physical.) Then, only refill to appointment. Thanks.

## 2014-08-25 MED ORDER — FERROUS SULFATE 325 (65 FE) MG PO TABS: ORAL_TABLET | ORAL | Status: DC

## 2014-08-25 NOTE — Telephone Encounter (Signed)
I called the pt and informed her of the message below and she stated she is out of town right now and does not have a calendar in front of her and will call back to reschedule an appt with Dr Maudie Mercury and endo.  She is aware a month supply of the iron was sent to her pharmacy.

## 2014-08-25 NOTE — Addendum Note (Signed)
Addended by: Agnes Lawrence on: 08/25/2014 01:45 PM   Modules accepted: Orders

## 2014-09-06 ENCOUNTER — Other Ambulatory Visit: Payer: Self-pay | Admitting: Family Medicine

## 2014-09-07 ENCOUNTER — Other Ambulatory Visit: Payer: Self-pay | Admitting: Family Medicine

## 2014-09-08 ENCOUNTER — Other Ambulatory Visit: Payer: Self-pay | Admitting: Internal Medicine

## 2014-09-14 ENCOUNTER — Encounter: Payer: Self-pay | Admitting: Family Medicine

## 2014-09-14 ENCOUNTER — Ambulatory Visit (INDEPENDENT_AMBULATORY_CARE_PROVIDER_SITE_OTHER): Payer: BLUE CROSS/BLUE SHIELD | Admitting: Family Medicine

## 2014-09-14 VITALS — BP 124/74 | HR 79 | Temp 97.9°F | Ht 70.5 in | Wt 302.9 lb

## 2014-09-14 DIAGNOSIS — D638 Anemia in other chronic diseases classified elsewhere: Secondary | ICD-10-CM

## 2014-09-14 DIAGNOSIS — E1129 Type 2 diabetes mellitus with other diabetic kidney complication: Secondary | ICD-10-CM

## 2014-09-14 DIAGNOSIS — I10 Essential (primary) hypertension: Secondary | ICD-10-CM

## 2014-09-14 DIAGNOSIS — E1165 Type 2 diabetes mellitus with hyperglycemia: Secondary | ICD-10-CM | POA: Diagnosis not present

## 2014-09-14 DIAGNOSIS — N184 Chronic kidney disease, stage 4 (severe): Secondary | ICD-10-CM

## 2014-09-14 DIAGNOSIS — IMO0002 Reserved for concepts with insufficient information to code with codable children: Secondary | ICD-10-CM

## 2014-09-14 DIAGNOSIS — E785 Hyperlipidemia, unspecified: Secondary | ICD-10-CM | POA: Diagnosis not present

## 2014-09-14 LAB — HEMOGLOBIN A1C: Hgb A1c MFr Bld: 8.5 % — ABNORMAL HIGH (ref 4.6–6.5)

## 2014-09-14 LAB — BASIC METABOLIC PANEL
BUN: 26 mg/dL — AB (ref 6–23)
CALCIUM: 8.8 mg/dL (ref 8.4–10.5)
CO2: 26 meq/L (ref 19–32)
Chloride: 104 mEq/L (ref 96–112)
Creatinine, Ser: 1.84 mg/dL — ABNORMAL HIGH (ref 0.40–1.20)
GFR: 36.13 mL/min — AB (ref >60.00)
GLUCOSE: 246 mg/dL — AB (ref 70–99)
Potassium: 4.5 mEq/L (ref 3.5–5.1)
Sodium: 138 mEq/L (ref 135–145)

## 2014-09-14 NOTE — Patient Instructions (Signed)
BEFORE YOU LEAVE: -labs -come fasting for you physical as scheduled and will get cholesterol check then  Call today to set up appointment with Dr. Cruzita Lederer about your diabetes- I will forward you diabetes labs to her  Please call today to set up you diabetic eye exam - please send copy of your eye exam report to [place]  We recommend the following healthy lifestyle measures: - eat a healthy diet consisting of lots of vegetables, fruits, beans, nuts, seeds, healthy meats such as white chicken and fish - avoid sweets, starches, carbs fried foods, fast food, processed foods, sodas, red meet and other fattening foods.  - get a least 150 minutes of aerobic exercise per week.

## 2014-09-14 NOTE — Progress Notes (Signed)
Pre visit review using our clinic review tool, if applicable. No additional management support is needed unless otherwise documented below in the visit note. 

## 2014-09-14 NOTE — Progress Notes (Signed)
HPI:  1-5: HTN, Dyslipidemia, CAD, CdCHF, Obesity:  -meds: lisinopril 10mg , toprol xl 25mg , lasix 40mg , lipitor 20mg   -lifestyle: no regular exercise and poor diet -denies: CP, SOB, palpitation, increased welling   6:DM with complication/CRI:  -followed by endocrinology and nephrology  -meds: admits misses does of her insulin, lantus 40 units bid + humolog bid with meals 15-30 unites -reviewed endocrine notes - pt did not follow up as planned per note -eye appointment: no done - [facility] -reports nephrologist at [facility] increased lisinopril  -home BS: checks bid - fasting normally around 130; evening usually around 215 on average per her report -denies: hypoglycemia, changes in vision, wounds -she reports missed her last nephrology -seeing Dr. Melony Overly in podiatry for foot ulcer, seen today and has follow up in 2 weeks  Venous Stasis Derm/Venous insuff:  -advised compression stockings, elevation and good skin care in the past  -she reports:wears compression stockings sometimes, seeing dermatologist   Anemia of chronic disease:  -on iron -denies: palpitation, bleeding, weakness  ROS: See pertinent positives and negatives per HPI.  Past Medical History  Diagnosis Date  . Diabetes mellitus   . Hypertension   . Obesity   . Hyperlipidemia   . Peripheral arterial disease? Eval by vascular 2013 with no LE PAD and advised to stop pletal   . Diabetes mellitus with renal manifestations, uncontrolled 01/09/2012  . Diabetic neuropathy 01/09/2012  . Venous stasis of lower extremity 03/10/2012  . Diastolic heart failure - stage 1 per review of echo results from 02/2010 01/15/2012  . At risk for heart disease - +stres test 2012 followed by [facility], [place] with neg cardiac cath 03/2010 (non-obstructive CAD) per review of records, echo 02/2010 normal LVF 01/15/2012  . Toe ulcer, right     Past Surgical History  Procedure Laterality Date  . Toe amputation   2008  . Eye surgery      Family History  Problem Relation Age of Onset  . Obesity Other   . Hypertension Other   . Hyperlipidemia Other   . Stroke Other   . Heart attack Other   . Diabetes Mother   . Hyperlipidemia Mother   . Hypertension Mother   . Heart attack Mother   . Stroke Mother   . Diabetes Sister   . Hypertension Brother   . Diabetes Sister   . Diabetes Sister   . Diabetes Sister     History   Social History  . Marital Status: Married    Spouse Name: N/A  . Number of Children: N/A  . Years of Education: N/A   Social History Main Topics  . Smoking status: Former Smoker    Types: Cigarettes    Quit date: 02/20/1999  . Smokeless tobacco: Never Used  . Alcohol Use: Yes  . Drug Use: Yes  . Sexual Activity: Not on file   Other Topics Concern  . None   Social History Narrative     Current outpatient prescriptions:  .  atorvastatin (LIPITOR) 20 MG tablet, TAKE 1 TABLET (20 MG TOTAL) BY MOUTH DAILY., Disp: 30 tablet, Rfl: 0 .  B-D ULTRAFINE III SHORT PEN 31G X 8 MM MISC, USE AS DIRECTED., Disp: 100 each, Rfl: 3 .  ferrous sulfate ([facility] IRON) 325 (65 FE) MG tablet, TAKE 1 TALBET BY MOUTH TWICE DAILY WITH A MEAL, Disp: 60 tablet, Rfl: 1 .  furosemide (LASIX) 40 MG tablet, Take 1 tablet (40 mg total) by mouth 2 (two)  times daily., Disp: 60 tablet, Rfl: 1 .  glucose blood (FREESTYLE LITE) test strip, 1 each by Other route as needed. , Disp: , Rfl:  .  HUMALOG KWIKPEN 100 UNIT/ML KiwkPen, INJECT UP TO 80UNITS DAILY, Disp: 10 pen, Rfl: 1 .  Insulin Glargine (LANTUS SOLOSTAR) 100 UNIT/ML Solostar Pen, Inject under skin 40 units 2x a day, Disp: 30 pen, Rfl: 2 .  Insulin Pen Needle (B-D ULTRAFINE III SHORT PEN) 31G X 8 MM MISC, Use as directed., Disp: 100 each, Rfl: 5 .  LANTUS SOLOSTAR 100 UNIT/ML Solostar Pen, INJECT 40 UNITS INTO THE SKIN 2 TIMES DAILY., Disp: 30 pen, Rfl: 1 .  lisinopril (PRINIVIL,ZESTRIL) 5 MG tablet, TAKE 2 TABLETS (10 MG TOTAL) BY MOUTH DAILY.,  Disp: 60 tablet, Rfl: 0 .  metoprolol succinate (TOPROL-XL) 25 MG 24 hr tablet, TAKE 1 TABLET (25 MG TOTAL) BY MOUTH DAILY., Disp: 30 tablet, Rfl: 0 .  Vitamin D, Ergocalciferol, (DRISDOL) 50000 UNITS CAPS, Take 1 Units by mouth every 30 (thirty) days. , Disp: , Rfl:   EXAM:  Filed Vitals:   09/14/14 0959  BP: 124/74  Pulse: 79  Temp: 97.9 F (36.6 C)    Body mass index is 42.83 kg/(m^2).  GENERAL: vitals reviewed and listed above, alert, oriented, appears well hydrated and in no acute distress  HEENT: atraumatic, conjunttiva clear, no obvious abnormalities on inspection of external nose and ears  NECK: no obvious masses on inspection  LUNGS: clear to auscultation bilaterally, no wheezes, rales or rhonchi, good air movement  CV: HRRR, no peripheral edema  MS: moves all extremities without noticeable abnormality  PSYCH: pleasant and cooperative, no obvious depression or anxiety  ASSESSMENT AND PLAN:  Discussed the following assessment and plan:  Diabetes mellitus with renal manifestations, uncontrolled - Plan: Hemoglobin A1c -uncontrolled, poor compliance, advised check labs and advised to schedule follow up with her endocrinologist -liefstyle recs  Hyperlipidemia -will check at physical, not fasting, ok last check  Essential hypertension - Plan: Basic metabolic panel -cont current medictaions  Chronic kidney disease (CKD), stage IV (severe) -sees nephrologist  Severe obesity (BMI >= 40) -lifestyle recs - disussed options including medication and surgery, may consider medication, lifestyle change first  Anemia of chronic disease  -labs -advised she schedule follow up with endo and her yearly eye exam -lifestyle recs -Patient advised to return or notify a doctor immediately if symptoms worsen or persist or new concerns arise.  Patient Instructions  BEFORE YOU LEAVE: -labs -come fasting for you physical as scheduled and will get cholesterol check then  Call  today to set up appointment with Dr. Cruzita Lederer about your diabetes- I will forward you diabetes labs to her  Please call today to set up you diabetic eye exam - please send copy of your eye exam report to [place]  We recommend the following healthy lifestyle measures: - eat a healthy diet consisting of lots of vegetables, fruits, beans, nuts, seeds, healthy meats such as white chicken and fish - avoid sweets, starches, carbs fried foods, fast food, processed foods, sodas, red meet and other fattening foods.  - get a least 150 minutes of aerobic exercise per week.       Colin Benton R.

## 2014-09-16 ENCOUNTER — Telehealth: Payer: Self-pay | Admitting: Family Medicine

## 2014-09-16 NOTE — Telephone Encounter (Signed)
Patient informed. 

## 2014-09-16 NOTE — Telephone Encounter (Signed)
Pt returned your call and would like a call back .. °

## 2014-10-12 ENCOUNTER — Other Ambulatory Visit: Payer: Self-pay | Admitting: Internal Medicine

## 2014-10-13 ENCOUNTER — Other Ambulatory Visit: Payer: Self-pay | Admitting: Family Medicine

## 2014-11-03 ENCOUNTER — Encounter: Payer: Self-pay | Admitting: Internal Medicine

## 2014-11-03 ENCOUNTER — Other Ambulatory Visit (INDEPENDENT_AMBULATORY_CARE_PROVIDER_SITE_OTHER): Payer: BLUE CROSS/BLUE SHIELD | Admitting: *Deleted

## 2014-11-03 ENCOUNTER — Ambulatory Visit (INDEPENDENT_AMBULATORY_CARE_PROVIDER_SITE_OTHER): Payer: BLUE CROSS/BLUE SHIELD | Admitting: Internal Medicine

## 2014-11-03 VITALS — BP 114/66 | HR 73 | Temp 97.8°F | Resp 12 | Wt 304.0 lb

## 2014-11-03 DIAGNOSIS — IMO0002 Reserved for concepts with insufficient information to code with codable children: Secondary | ICD-10-CM

## 2014-11-03 DIAGNOSIS — E1129 Type 2 diabetes mellitus with other diabetic kidney complication: Secondary | ICD-10-CM | POA: Diagnosis not present

## 2014-11-03 DIAGNOSIS — E1165 Type 2 diabetes mellitus with hyperglycemia: Secondary | ICD-10-CM

## 2014-11-03 DIAGNOSIS — Z23 Encounter for immunization: Secondary | ICD-10-CM

## 2014-11-03 NOTE — Progress Notes (Signed)
Subjective:     Patient ID: Savannah Crawford, female   DOB: 1955/07/21, 59 y.o.   MRN: [id number]  HPI Savannah Crawford is a 59 y.o. woman returning for f/u for DM2, insulin-dependent, uncontrolled, with complications (CAD, CKD, diabetes neuropathy, diabetic retinopathy, PVD, amputated toe-R). Last visit 8 mo ago.   She is seeing podiatry (Dr. Melony Overly) for L 3rd toe ulcer.  Last hemoglobin A1c was: Lab Results  Component Value Date   HGBA1C 8.5* 09/14/2014   HGBA1C 8.9* 03/03/2014   HGBA1C 8.5* 09/14/2013   She is on a regimen of: - Lantus (tried Toujeo but highs in am) 40 units 2x a day - Humalog dose: - breakfast: 25 units - lunch (not taking):   10 units for a salad  15 units for a sandwich - dinner: 25 units  - Humalog Sliding scale: - 150- 165: + 1 unit  - 166- 180: + 2 units  - 181- 195: + 3 units  - 196- 210: + 4 units  - >210: + 5 units   She was on metformin in the past, but stopped due to chronic kidney disease.   She checks her sugars 2x a day. She again does not bring her log or meter.  - am: 98-150 >> 170-190 on Toujeo, in the last month: 98-145, on Lantus >> 83, 115-173, if does not forget insulin at night - prelunch: 105 (it is difficult for her to check since she is at work) >> 98-120 >> n/c >> 117-120 >> 200 - predinner: 170-220 >> 170-180 (occasionally 200 less than once a week) >> 178-190 >> 178-190 >> 149-228 - bedtime: 187-310 (highest when checked after eating - if forgets insulin - maybe once a week) >> n/c >> 183-334, 461x1 No lows. She has hypoglycemia awareness. Highest sugar 461 (no meds) since last visit.   She is also seen by nephrology for her chronic kidney disease.  Lab Results  Component Value Date   BUN 26* 09/14/2014   CREATININE 1.84* 09/14/2014  She is on Lisinopril 5 bid.  Her cholesterol levels are at goal: Lab Results  Component Value Date   CHOL 123 09/14/2013   HDL 47.40 09/14/2013   LDLCALC 60 09/14/2013   TRIG 78.0 09/14/2013   CHOLHDL  3 09/14/2013  She is on Lipitor 20.  Has DR, last eye exam in 02/2013. No DR. Has numbness and tingling in feet. She sees podiatry.   She also has a history of nonobstructive CAD, with the positive stress test but a negative cardiac cath 2012, and also has diastolic heart failure. She has hypertension, hyperlipidemia, venous stasis, vitamin D deficiency.  I reviewed pt's medications, allergies, PMH, social hx, family hx, and changes were documented in the history of present illness. Otherwise, unchanged from my initial visit note.  Review of Systems Constitutional: + weight gain, no fatigue, no subjective hyperthermia/hypothermia Eyes: no blurry vision, no xerophthalmia ENT: no sore throat, no nodules palpated in throat, no dysphagia/odynophagia, no hoarseness Cardiovascular: no CP/SOB/palpitations/leg swelling Respiratory: no cough/no SOB/wheezing Gastrointestinal: no N/V/D/C Musculoskeletal: no muscle aches/no joint aches Skin: + rash, + itching Neurological: no tremors/numbness/tingling/dizziness, no HA   Objective:   Physical Exam BP 114/66 mmHg  Pulse 73  Temp(Src) 97.8 F (36.6 C) (Oral)  Resp 12  Wt 304 lb (137.893 kg)  SpO2 96% Wt Readings from Last 3 Encounters:  11/03/14 304 lb (137.893 kg)  09/14/14 302 lb 14.4 oz (137.395 kg)  03/03/14 294 lb 12.8 oz (133.72 kg)  Constitutional: overweight, in NAD Eyes: PERRLA, EOMI, no exophthalmos ENT: moist mucous membranes, no thyromegaly, no cervical lymphadenopathy Cardiovascular: RRR, No MRG Respiratory: CTA B Gastrointestinal: abdomen soft, NT, ND, BS+ Musculoskeletal: no deformities, strength intact in all 4 Skin: moist, warm Neurological: no tremor with outstretched hands, DTR normal in all 4  Assessment:     1. DM2, insulin-dependent, uncontrolled, with complications - nonobstructive CAD - CKD - diabetes neuropathy - diabetic retinopathy - PVD - R amputated toe    Plan:     Pt with uncontrolled  diabetes, returning after a long absence. She still has increasing sugars throughout the day, especially before and after dinner >> again explained that we cannot get the sugars under control unless we add mealtime insulin with lunch - I have been trying to convince her to do this for ~1 year... Will retry. - I advised her to: Patient Instructions  Please continue: - Lantus 40 units 2x a day  Please add lunchtime Humalog dose: - Humalog dose: - breakfast: 25 units - lunch:  15 units for a salad  20 units for a sandwich - dinner: 25 units   Continue: - Humalog Sliding scale: - 150- 165: + 1 unit  - 166- 180: + 2 units  - 181- 195: + 3 units  - 196- 210: + 4 units  - >210: + 5 units   Please return in 1.5 months with your sugar log.   - will give flu vaccine today - needs a new eye exam >> advised to schedule - last HbA1C reviewed: 8.5% 2 mo ago. - RTC in 1.5 mo with sugar log

## 2014-11-03 NOTE — Patient Instructions (Signed)
Please continue: - Lantus 40 units 2x a day  Please add lunchtime Humalog dose: - Humalog dose: - breakfast: 25 units - lunch:  15 units for a salad  20 units for a sandwich - dinner: 25 units   Continue: - Humalog Sliding scale: - 150- 165: + 1 unit  - 166- 180: + 2 units  - 181- 195: + 3 units  - 196- 210: + 4 units  - >210: + 5 units   Please return in 1.5 months with your sugar log.

## 2014-11-10 ENCOUNTER — Other Ambulatory Visit: Payer: Self-pay | Admitting: Internal Medicine

## 2014-12-01 ENCOUNTER — Other Ambulatory Visit: Payer: Self-pay

## 2014-12-01 DIAGNOSIS — Z1231 Encounter for screening mammogram for malignant neoplasm of breast: Secondary | ICD-10-CM

## 2014-12-08 ENCOUNTER — Ambulatory Visit: Payer: BLUE CROSS/BLUE SHIELD | Admitting: Internal Medicine

## 2014-12-08 ENCOUNTER — Ambulatory Visit
Admission: RE | Admit: 2014-12-08 | Discharge: 2014-12-08 | Disposition: A | Discharge status: Home / Self Care | Payer: BLUE CROSS/BLUE SHIELD | Source: Ambulatory Visit

## 2014-12-08 DIAGNOSIS — Z1231 Encounter for screening mammogram for malignant neoplasm of breast: Secondary | ICD-10-CM

## 2014-12-15 ENCOUNTER — Other Ambulatory Visit: Payer: Self-pay | Admitting: Family Medicine

## 2014-12-15 ENCOUNTER — Ambulatory Visit (INDEPENDENT_AMBULATORY_CARE_PROVIDER_SITE_OTHER): Payer: BLUE CROSS/BLUE SHIELD | Admitting: Family Medicine

## 2014-12-15 ENCOUNTER — Ambulatory Visit (INDEPENDENT_AMBULATORY_CARE_PROVIDER_SITE_OTHER): Payer: BLUE CROSS/BLUE SHIELD | Admitting: Internal Medicine

## 2014-12-15 ENCOUNTER — Other Ambulatory Visit (INDEPENDENT_AMBULATORY_CARE_PROVIDER_SITE_OTHER): Payer: BLUE CROSS/BLUE SHIELD | Admitting: *Deleted

## 2014-12-15 ENCOUNTER — Encounter: Payer: Self-pay | Admitting: *Deleted

## 2014-12-15 ENCOUNTER — Encounter: Payer: Self-pay | Admitting: Family Medicine

## 2014-12-15 ENCOUNTER — Encounter: Payer: Self-pay | Admitting: Internal Medicine

## 2014-12-15 VITALS — BP 128/76 | HR 67 | Temp 97.7°F | Ht 71.5 in | Wt 308.7 lb

## 2014-12-15 VITALS — BP 114/64 | HR 65 | Temp 98.3°F | Resp 12 | Wt 308.0 lb

## 2014-12-15 DIAGNOSIS — N184 Chronic kidney disease, stage 4 (severe): Secondary | ICD-10-CM | POA: Diagnosis not present

## 2014-12-15 DIAGNOSIS — Z794 Long term (current) use of insulin: Secondary | ICD-10-CM

## 2014-12-15 DIAGNOSIS — I8312 Varicose veins of left lower extremity with inflammation: Secondary | ICD-10-CM

## 2014-12-15 DIAGNOSIS — E785 Hyperlipidemia, unspecified: Secondary | ICD-10-CM | POA: Diagnosis not present

## 2014-12-15 DIAGNOSIS — E1165 Type 2 diabetes mellitus with hyperglycemia: Secondary | ICD-10-CM

## 2014-12-15 DIAGNOSIS — I878 Other specified disorders of veins: Secondary | ICD-10-CM

## 2014-12-15 DIAGNOSIS — I10 Essential (primary) hypertension: Secondary | ICD-10-CM | POA: Diagnosis not present

## 2014-12-15 DIAGNOSIS — I5032 Chronic diastolic (congestive) heart failure: Secondary | ICD-10-CM

## 2014-12-15 DIAGNOSIS — E1122 Type 2 diabetes mellitus with diabetic chronic kidney disease: Secondary | ICD-10-CM

## 2014-12-15 DIAGNOSIS — IMO0002 Reserved for concepts with insufficient information to code with codable children: Secondary | ICD-10-CM

## 2014-12-15 DIAGNOSIS — Z0000 Encounter for general adult medical examination without abnormal findings: Secondary | ICD-10-CM

## 2014-12-15 DIAGNOSIS — I872 Venous insufficiency (chronic) (peripheral): Secondary | ICD-10-CM

## 2014-12-15 DIAGNOSIS — D638 Anemia in other chronic diseases classified elsewhere: Secondary | ICD-10-CM | POA: Diagnosis not present

## 2014-12-15 DIAGNOSIS — I8311 Varicose veins of right lower extremity with inflammation: Secondary | ICD-10-CM

## 2014-12-15 LAB — BASIC METABOLIC PANEL
BUN: 27 mg/dL — AB (ref 6–23)
CHLORIDE: 106 meq/L (ref 96–112)
CO2: 29 mEq/L (ref 19–32)
CREATININE: 1.62 mg/dL — AB (ref 0.40–1.20)
Calcium: 9.3 mg/dL (ref 8.4–10.5)
GFR: 41.82 mL/min — ABNORMAL LOW (ref >60.00)
Glucose, Bld: 98 mg/dL (ref 70–99)
POTASSIUM: 4.9 meq/L (ref 3.5–5.1)
SODIUM: 140 meq/L (ref 135–145)

## 2014-12-15 LAB — CBC WITH DIFFERENTIAL/PLATELET
BASOS ABS: 0 10*3/uL (ref 0.0–0.1)
Basophils Relative: 0.3 % (ref 0.0–3.0)
EOS ABS: 0.3 10*3/uL (ref 0.0–0.7)
Eosinophils Relative: 3.6 % (ref 0.0–5.0)
HCT: 36.2 % (ref 36.0–46.0)
Hemoglobin: 11.7 g/dL — ABNORMAL LOW (ref 12.0–15.0)
LYMPHS ABS: 2.6 10*3/uL (ref 0.7–4.0)
Lymphocytes Relative: 30.8 % (ref 12.0–46.0)
MCHC: 32.4 g/dL (ref 30.0–36.0)
MCV: 83.4 fl (ref 78.0–100.0)
MONO ABS: 0.9 10*3/uL (ref 0.1–1.0)
Monocytes Relative: 10.7 % (ref 3.0–12.0)
NEUTROS ABS: 4.6 10*3/uL (ref 1.4–7.7)
NEUTROS PCT: 54.6 % (ref 43.0–77.0)
PLATELETS: 272 10*3/uL (ref 150.0–400.0)
RBC: 4.34 Mil/uL (ref 3.87–5.11)
RDW: 14.5 % (ref 11.5–15.5)
WBC: 8.4 10*3/uL (ref 4.0–10.5)

## 2014-12-15 LAB — LIPID PANEL
CHOL/HDL RATIO: 3
Cholesterol: 139 mg/dL (ref 0–200)
HDL: 50.1 mg/dL (ref >39.00)
LDL CALC: 74 mg/dL (ref 0–99)
NonHDL: 88.49
TRIGLYCERIDES: 73 mg/dL (ref 0.0–149.0)
VLDL: 14.6 mg/dL (ref 0.0–40.0)

## 2014-12-15 LAB — POCT GLYCOSYLATED HEMOGLOBIN (HGB A1C): HEMOGLOBIN A1C: 7.8

## 2014-12-15 MED ORDER — LISINOPRIL 10 MG PO TABS: ORAL_TABLET | ORAL | Status: DC

## 2014-12-15 MED ORDER — TRIAMCINOLONE ACETONIDE 0.1 % EX CREA: 1.0000 "application " | TOPICAL_CREAM | Freq: Two times a day (BID) | CUTANEOUS | Status: DC

## 2014-12-15 MED ORDER — FUROSEMIDE 40 MG PO TABS: 40.0000 mg | ORAL_TABLET | Freq: Two times a day (BID) | ORAL | Status: DC

## 2014-12-15 MED ORDER — ATORVASTATIN CALCIUM 20 MG PO TABS: ORAL_TABLET | ORAL | Status: DC

## 2014-12-15 MED ORDER — METOPROLOL SUCCINATE ER 25 MG PO TB24: ORAL_TABLET | ORAL | Status: DC

## 2014-12-15 NOTE — Progress Notes (Signed)
Pre visit review using our clinic review tool, if applicable. No additional management support is needed unless otherwise documented below in the visit note. 

## 2014-12-15 NOTE — Patient Instructions (Signed)
BEFORE YOU LEAVE: -labs -schedule follow up in 4-6 months  I sent refills and the steroid cream to your pharmacy. Mix the steroid cream with CERAVE cream and apply to the legs 1-2 times daily as needed.  PLEASE NOTE: I changed the lisinopril dosing so that you only have to take one tablet daily!  GET your diabetic eye exam and please send a copy of the report to our office.  We recommend the following healthy lifestyle measures: - eat a healthy whole foods diet consisting of regular small meals composed of vegetables, fruits, beans, nuts, seeds, healthy meats such as white chicken and fish and whole grains.  - avoid sweets, white starchy foods, fried foods, fast food, processed foods, sodas, red meet and other fattening foods.  - get a least 150-300 minutes of aerobic exercise per week.   -We have ordered labs or studies at this visit. It can take up to 1-2 weeks for results and processing. We will contact you with instructions IF your results are abnormal. Normal results will be released to your [facility]. If you have not heard from [place] or can not find your results in [facility] in 2 weeks please contact our office.

## 2014-12-15 NOTE — Progress Notes (Signed)
HPI:  Here for CPE:  -Concerns and/or follow up today:   1-5: HTN, Dyslipidemia, CAD, CdCHF, Obesity:  -meds: lisinopril 10mg , toprol xl 25mg , lasix 40mg , lipitor 20mg   -lifestyle: no regular exercise and poor diet -denies: CP, SOB, palpitation, increased welling   6:DM with complication/CRI:  -followed by endocrinology and nephrology  -meds: admits misses does of her insulin, lantus 40 units bid + humolog bid with meals 15-30 unites -diet is high if fat - sausage, eggs, red meat and potatoes are stables and she like sweets, no regular exercise -eye exam: reports is scheduled for today -denies: hypoglycemia, changes in vision -seeing Dr. Melony Overly in podiatry for foot ulcer on the L foot and is wearing a post op boot - reports she had skin graft and wound care yesterday with Dr. Melony Overly. Wants letter that she needs diabetic shoes.  Venous Stasis Derm/Venous insuff:  -advised compression stockings, elevation and good skin care in the past  -she reports:wears compression stockings sometimes, seeing dermatologist  -she reports skin issues healed with compounded moisturizer and steroid cream, but ran out and now pharmacy does not make any longer and has small patch of developing dry skin on R LE  Anemia of chronic disease:  -on iron -denies: palpitation, bleeding, weakness  -Taking folic acid, vitamin D or calcium: taking vit d  -Vaccines: UTD  -pap history: UTD, normal   -wants STI testing (Hep C if born [birth year]): no  -FH breast, colon or ovarian ca: see FH Last mammogram: just done Last colon cancer screening: 2013 per report  -Alcohol, Tobacco, drug use: see social history  Review of Systems - no fevers, unintentional weight loss, vision loss, hearing loss, chest pain, sob, hemoptysis, melena, hematochezia, hematuria, genital discharge, changing or concerning skin lesions, bleeding, bruising, loc, thoughts of self harm or SI  Past Medical History  Diagnosis Date  .  Diabetes mellitus with renal manifestations, uncontrolled ([place])   . Hypertension   . Obesity   . Hyperlipidemia   . Peripheral arterial disease? Eval by vascular 2013 with no LE PAD and advised to stop pletal   . Diabetic neuropathy ([place]) 01/09/2012  . Venous stasis of lower extremity 03/10/2012  . Diastolic heart failure - stage 1 per review of echo results from 02/2010 01/15/2012  . At risk for heart disease - +stres test 2012 followed by [facility], [place] with neg cardiac cath 03/2010 (non-obstructive CAD) per review of records, echo 02/2010 normal LVF 01/15/2012  . Toe ulcer, right ([place])   . Chronic kidney disease (CKD), stage IV (severe) ([place]) 01/09/2012  . Anemia of chronic disease 11/17/2012  . Severe obesity (BMI >= 40) ([place]) 11/13/2012  . Ulcer of toe of left foot ([place])     treated by [facility] per patient    Past Surgical History  Procedure Laterality Date  . Toe amputation  2008  . Eye surgery      Family History  Problem Relation Age of Onset  . Obesity Other   . Hypertension Other   . Hyperlipidemia Other   . Stroke Other   . Heart attack Other   . Diabetes Mother   . Hyperlipidemia Mother   . Hypertension Mother   . Heart attack Mother   . Stroke Mother   . Diabetes Sister   . Hypertension Brother   . Diabetes Sister   . Diabetes Sister   . Diabetes Sister     Social History   Social History  . Marital  Status: Married    Spouse Name: N/A  . Number of Children: N/A  . Years of Education: N/A   Social History Main Topics  . Smoking status: Former Smoker    Types: Cigarettes    Quit date: 02/20/1999  . Smokeless tobacco: Never Used  . Alcohol Use: Yes  . Drug Use: Yes  . Sexual Activity: Not Asked   Other Topics Concern  . None   Social History Narrative     Current outpatient prescriptions:  .  atorvastatin (LIPITOR) 20 MG tablet, TAKE 1 TABLET (20 MG TOTAL) BY MOUTH DAILY., Disp: 90 tablet, Rfl: 3 .  B-D ULTRAFINE III SHORT PEN  31G X 8 MM MISC, USE AS DIRECTED., Disp: 100 each, Rfl: 3 .  cholecalciferol (VITAMIN D) 1000 UNITS tablet, Take 1,000 Units by mouth daily., Disp: , Rfl:  .  ferrous sulfate ([employer] IRON) 325 (65 FE) MG tablet, TAKE 1 TALBET BY MOUTH TWICE DAILY WITH A MEAL, Disp: 60 tablet, Rfl: 1 .  furosemide (LASIX) 40 MG tablet, Take 1 tablet (40 mg total) by mouth 2 (two) times daily., Disp: 180 tablet, Rfl: 3 .  glucose blood (ONE TOUCH ULTRA TEST) test strip, 1 each by Other route as needed for other. Use as instructed, Disp: , Rfl:  .  HUMALOG KWIKPEN 100 UNIT/ML KiwkPen, INJECT UP TO 80UNITS DAILY (Patient taking differently: INJECT UP TO 50UNITS DAILY), Disp: 10 pen, Rfl: 1 .  Insulin Glargine (LANTUS SOLOSTAR) 100 UNIT/ML Solostar Pen, Inject under skin 40 units 2x a day, Disp: 30 pen, Rfl: 2 .  Insulin Pen Needle (B-D ULTRAFINE III SHORT PEN) 31G X 8 MM MISC, Use as directed., Disp: 100 each, Rfl: 5 .  LANTUS SOLOSTAR 100 UNIT/ML Solostar Pen, INJECT 40 UNITS INTO THE SKIN 2 TIMES DAILY., Disp: 25 pen, Rfl: 1 .  lisinopril (PRINIVIL,ZESTRIL) 10 MG tablet, TAKE 1 TABLETS (10 MG TOTAL) BY MOUTH DAILY., Disp: 90 tablet, Rfl: 3 .  metoprolol succinate (TOPROL-XL) 25 MG 24 hr tablet, TAKE 1 TABLET (25 MG TOTAL) BY MOUTH DAILY., Disp: 90 tablet, Rfl: 3 .  Vitamin D, Ergocalciferol, (DRISDOL) 50000 UNITS CAPS, Take 1 Units by mouth every 30 (thirty) days. , Disp: , Rfl:  .  triamcinolone cream (KENALOG) 0.1 %, Apply 1 application topically 2 (two) times daily. Mix with moisturizing cream and apply to legs twice daily., Disp: 80 g, Rfl: 0  EXAM:  Filed Vitals:   12/15/14 0831  BP: 128/76  Pulse: 67  Temp: 97.7 F (36.5 C)    GENERAL: vitals reviewed and listed below, alert, oriented, appears well hydrated and in no acute distress  HEENT: head atraumatic, PERRLA, normal appearance of eyes, ears, nose and mouth. moist mucus membranes.  NECK: supple, no masses or lymphadenopathy  LUNGS: clear to  auscultation bilaterally, no rales, rhonchi or wheeze  CV: HRRR, no peripheral edema or cyanosis, normal pedal pulses  BREAST: declined  ABDOMEN: bowel sounds normal, soft, non tender to palpation, no masses, no rebound or guarding  GU: declined  SKIN: L foot in dressing and post op boot, R LE small patch of hyperpigmented scaly skin  MS: normal gait, moves all extremities normally  NEURO: CN II-XII grossly intact, normal muscle strength and sensation to light touch on extremities  PSYCH: normal affect, pleasant and cooperative  ASSESSMENT AND PLAN:  Discussed the following assessment and plan:  Encounter for preventive health examination - Plan: Hep C Antibody -Discussed and advised all [place] preventive services health  task force level A and B recommendations for age, sex and risks.  -Advised at least 150 minutes of exercise per week and a healthy diet low in saturated fats and sweets and consisting of fresh fruits and vegetables, lean meats such as fish and white chicken and whole grains.  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, with long-term current use of insulin (HCC) -lifestyle recs discussed at length -sees endo  Essential hypertension - Plan: Basic metabolic panel -good today, cont current tx  Chronic kidney disease (CKD), stage IV (severe) (H[facility]) -sees nephrologist  Chronic diastolic heart failure (HCC) -no symptoms  Morbid obesity, unspecified obesity type ([place]) -lifestyle recs  Venous stasis of lower extremity   Anemia of chronic disease - Plan: CBC with Differential -cont tx  Venous stasis dermatitis of both lower extremities -CERAVE cream and steroid cream prn  Hyperlipemia - Plan: Lipid Panel    Orders Placed This Encounter  Procedures  . Lipid Panel  . CBC with Differential  . Basic metabolic panel  . Hep C Antibody    Patient advised to  return to clinic immediately if symptoms worsen or persist or new concerns.  Patient Instructions  BEFORE YOU LEAVE: -labs -schedule follow up in 4-6 months  I sent refills and the steroid cream to your pharmacy. Mix the steroid cream with CERAVE cream and apply to the legs 1-2 times daily as needed.  PLEASE NOTE: I changed the lisinopril dosing so that you only have to take one tablet daily!  GET your diabetic eye exam and please send a copy of the report to our office.  We recommend the following healthy lifestyle measures: - eat a healthy whole foods diet consisting of regular small meals composed of vegetables, fruits, beans, nuts, seeds, healthy meats such as white chicken and fish and whole grains.  - avoid sweets, white starchy foods, fried foods, fast food, processed foods, sodas, red meet and other fattening foods.  - get a least 150-300 minutes of aerobic exercise per week.   -We have ordered labs or studies at this visit. It can take up to 1-2 weeks for results and processing. We will contact you with instructions IF your results are abnormal. Normal results will be released to your [facility]. If you have not heard from [place] or can not find your results in [facility] in 2 weeks please contact our office.           No Follow-up on file.  Colin Benton R.

## 2014-12-15 NOTE — Patient Instructions (Signed)
Please continue: - Lantus 40 units 2x a day  Please add lunchtime Humalog dose: - Humalog dose: - breakfast: 25 units - lunch:  10 units for a salad  15 units for a sandwich - dinner: 25 units   Continue: - Humalog Sliding scale: - 150- 165: + 1 unit  - 166- 180: + 2 units  - 181- 195: + 3 units  - 196- 210: + 4 units  - >210: + 5 units   Please let me know how your sugars are doing in 2 weeks.  Please return in 3 months with your sugar log.

## 2014-12-15 NOTE — Progress Notes (Signed)
Patient is wearing a post-op shoe on the left foot.

## 2014-12-15 NOTE — Progress Notes (Signed)
Subjective:     Patient ID: Savannah Crawford, female   DOB: 14-May-1955, 59 y.o.   MRN: [id number]  HPI [name] is a 59 y.o. woman returning for f/u for DM2, insulin-dependent, uncontrolled, with complications (CAD, CKD, diabetes neuropathy, diabetic retinopathy, PVD, amputated toe-R). Last visit 1.5 mo ago.   Last hemoglobin A1c was: Lab Results  Component Value Date   HGBA1C 8.5* 09/14/2014   HGBA1C 8.9* 03/03/2014   HGBA1C 8.5* 09/14/2013   She is on a regimen of: - Lantus (tried Toujeo but highs in am) 40 units 2x a day - Humalog dose: - breakfast: 25 units - lunch:  15 units for a salad  20 units for a sandwich - dinner: 25 units  - Humalog Sliding scale: - 150- 165: + 1 unit  - 166- 180: + 2 units  - 181- 195: + 3 units  - 196- 210: + 4 units  - >210: + 5 units   She was on metformin in the past, but stopped due to chronic kidney disease.   She checks her sugars 2x a day. Per meter download: - am: 98-150 >> 170-190 on Toujeo, in the last month: 98-145, on Lantus >> 83, 115-173 >> 128-185 - prelunch: 105 (it is difficult for her to check since she is at work) >> 98-120 >> n/c >> 117-120 >> 200 >> 201 - predinner: 170-220 >> 170-180 (occasionally 200 less than once a week) >> 178-190 >> 178-190 >> 149-228 >> 121, 154-255, 338, 443 - bedtime: 187-310 (highest when checked after eating - if forgets insulin - maybe once a week) >> n/c >> 183-334, 461x1 >> 98, 241, 301 No lows. She has hypoglycemia awareness. Highest sugar 433 (no meds) since last visit.   She is also seen by nephrology for her chronic kidney disease.  Lab Results  Component Value Date   BUN 26* 09/14/2014   CREATININE 1.84* 09/14/2014  She is on Lisinopril 5 bid >> 1x a day  Her cholesterol levels are at goal: Lab Results  Component Value Date   CHOL 123 09/14/2013   HDL 47.40 09/14/2013   LDLCALC 60 09/14/2013   TRIG 78.0 09/14/2013   CHOLHDL 3 09/14/2013  She is on Lipitor 20.  Has DR, last eye  exam in 02/2013. No DR. Has numbness and tingling in feet. She sees podiatry  (Dr. Melony Overly) for L 3rd toe ulcer.  She also has a history of nonobstructive CAD, with the positive stress test but a negative cardiac cath 2012, and also has diastolic heart failure. She has hypertension, hyperlipidemia, venous stasis, vitamin D deficiency.  I reviewed pt's medications, allergies, PMH, social hx, family hx, and changes were documented in the history of present illness. Otherwise, unchanged from my initial visit note.  Review of Systems Constitutional: + weight gain, no fatigue, no subjective hyperthermia/hypothermia Eyes: no blurry vision, no xerophthalmia ENT: no sore throat, no nodules palpated in throat, no dysphagia/odynophagia, no hoarseness Cardiovascular: no CP/SOB/palpitations/leg swelling Respiratory: no cough/no SOB/wheezing Gastrointestinal: no N/V/D/C/+ acid reflux Musculoskeletal: no muscle aches/no joint aches Skin: no rash, no itching Neurological: no tremors/numbness/tingling/dizziness, no HA   Objective:   Physical Exam BP 114/64 mmHg  Pulse 65  Temp(Src) 98.3 F (36.8 C) (Oral)  Resp 12  Wt 308 lb (139.708 kg)  SpO2 95% Body mass index is 42.36 kg/(m^2). Wt Readings from Last 3 Encounters:  12/15/14 308 lb (139.708 kg)  12/15/14 308 lb 11.2 oz (140.025 kg)  11/03/14 304 lb (137.893 kg)  Constitutional: overweight, in NAD Eyes: PERRLA, EOMI, no exophthalmos ENT: moist mucous membranes, no thyromegaly, no cervical lymphadenopathy Cardiovascular: RRR, No MRG, + B leg swelling Respiratory: CTA B Gastrointestinal: abdomen soft, NT, ND, BS+ Musculoskeletal: no deformities, strength intact in all 4 Skin: moist, warm Neurological: no tremor with outstretched hands, DTR normal in all 4  Assessment:     1. DM2, insulin-dependent, uncontrolled, with complications - nonobstructive CAD - CKD - diabetes neuropathy - diabetic retinopathy - PVD - R amputated toe    Plan:      Pt with uncontrolled diabetes, noncompliant with insulin dosing. She still did not add lunchtime insulin despite repeated advise to do so. As a consequence, her predinner sugars are very high >> bedtime hyperglycemia >> am hyperglycemia. I again advised her that she absolutely need to start this.  - I advised her to: Patient Instructions  Please continue: - Lantus 40 units 2x a day  Please add lunchtime Humalog dose: - Humalog dose: - breakfast: 25 units - lunch:  15 units for a salad  20 units for a sandwich - dinner: 25 units   Continue: - Humalog Sliding scale: - 150- 165: + 1 unit  - 166- 180: + 2 units  - 181- 195: + 3 units  - 196- 210: + 4 units  - >210: + 5 units   Please return in 1.5 months with your sugar log.   - given flu vaccine at last visit - needs a new eye exam >> advised to schedule - last HbA1C reviewed: 8.5% >> 7.8% (better!) - RTC in 3 mo with sugar log

## 2014-12-16 LAB — HEPATITIS C ANTIBODY: HCV AB: NEGATIVE

## 2015-01-10 ENCOUNTER — Other Ambulatory Visit: Payer: Self-pay | Admitting: Family Medicine

## 2015-03-15 ENCOUNTER — Other Ambulatory Visit: Payer: Self-pay | Admitting: *Deleted

## 2015-03-15 ENCOUNTER — Encounter: Payer: Self-pay | Admitting: Internal Medicine

## 2015-03-15 ENCOUNTER — Other Ambulatory Visit (INDEPENDENT_AMBULATORY_CARE_PROVIDER_SITE_OTHER): Payer: BLUE CROSS/BLUE SHIELD | Admitting: *Deleted

## 2015-03-15 ENCOUNTER — Ambulatory Visit (INDEPENDENT_AMBULATORY_CARE_PROVIDER_SITE_OTHER): Payer: BLUE CROSS/BLUE SHIELD | Admitting: Internal Medicine

## 2015-03-15 DIAGNOSIS — E1165 Type 2 diabetes mellitus with hyperglycemia: Secondary | ICD-10-CM

## 2015-03-15 DIAGNOSIS — E1159 Type 2 diabetes mellitus with other circulatory complications: Secondary | ICD-10-CM | POA: Insufficient documentation

## 2015-03-15 DIAGNOSIS — N183 Chronic kidney disease, stage 3 (moderate): Secondary | ICD-10-CM

## 2015-03-15 DIAGNOSIS — E1122 Type 2 diabetes mellitus with diabetic chronic kidney disease: Secondary | ICD-10-CM | POA: Diagnosis not present

## 2015-03-15 DIAGNOSIS — Z794 Long term (current) use of insulin: Secondary | ICD-10-CM

## 2015-03-15 DIAGNOSIS — IMO0002 Reserved for concepts with insufficient information to code with codable children: Secondary | ICD-10-CM

## 2015-03-15 LAB — POCT GLYCOSYLATED HEMOGLOBIN (HGB A1C): Hemoglobin A1C: 8.7

## 2015-03-15 MED ORDER — INSULIN ASPART 100 UNIT/ML FLEXPEN: PEN_INJECTOR | SUBCUTANEOUS | Status: DC

## 2015-03-15 MED ORDER — INSULIN GLARGINE 100 UNIT/ML SOLOSTAR PEN: PEN_INJECTOR | SUBCUTANEOUS | Status: DC

## 2015-03-15 MED ORDER — INSULIN LISPRO 100 UNIT/ML (KWIKPEN): PEN_INJECTOR | SUBCUTANEOUS | Status: DC

## 2015-03-15 NOTE — Progress Notes (Signed)
Subjective:     Patient ID: Savannah Crawford, female   DOB: 03-Jun-1955, 60 y.o.   MRN: [id number]  HPI [name] is a 60 y.o. woman returning for f/u for DM2, insulin-dependent, uncontrolled, with complications (CAD, CKD, diabetes neuropathy, diabetic retinopathy, PVD, amputated toe-R). Last visit 3 mo ago.   Last hemoglobin A1c was: Lab Results  Component Value Date   HGBA1C 7.8 12/15/2014   HGBA1C 8.5* 09/14/2014   HGBA1C 8.9* 03/03/2014   She is on a regimen of: - Lantus (tried Toujeo but highs in am) 40 units 2x a day - Humalog dose: - breakfast: 25 units - lunch - still did not add, despite multiple discussions and repeated advice to do so!!!!   15 units for a salad  20 units for a sandwich - dinner: 25 units  - Humalog Sliding scale: - 150- 165: + 1 unit  - 166- 180: + 2 units  - 181- 195: + 3 units  - 196- 210: + 4 units  - >210: + 5 units  She was on metformin in the past, but stopped due to chronic kidney disease.   She checks her sugars 2x a day - sugars are worse. Per meter download: - am: 170-190 on Toujeo, in the last month: 98-145, on Lantus >> 83, 115-173 >> 128-185 >> 113-268, 306 - prelunch: 98-120 >> n/c >> 117-120 >> 200 >> 201 >> 178, 220 - predinner: 178-190 >> 178-190 >> 149-228 >> 121, 154-255, 338, 443 >> 155-396 - bedtime: 183-334, 461x1 >> 98, 241, 301 >> 148-404 No lows. She has hypoglycemia awareness. Highest sugar 433 (no meds) >> 476 since last visit.   She is also seen by nephrology for her chronic kidney disease.  Lab Results  Component Value Date   BUN 27* 12/15/2014   CREATININE 1.62* 12/15/2014  She is on Lisinopril 5 bid >> 1x a day  Her cholesterol levels are at goal: Lab Results  Component Value Date   CHOL 139 12/15/2014   HDL 50.10 12/15/2014   LDLCALC 74 12/15/2014   TRIG 73.0 12/15/2014   CHOLHDL 3 12/15/2014  She is on Lipitor 20.  Has DR, last eye exam in 02/2013. No DR. Has numbness and tingling in feet. She sees podiatry   (Dr. Melony Overly) for L 3rd toe ulcer.  She also has a history of nonobstructive CAD, with the positive stress test but a negative cardiac cath 2012, and also has diastolic heart failure. She has hypertension, hyperlipidemia, venous stasis, vitamin D deficiency - started vit D high dose.  I reviewed pt's medications, allergies, PMH, social hx, family hx, and changes were documented in the history of present illness. Otherwise, unchanged from my initial visit note.  Review of Systems Constitutional: + weight gain, no fatigue, no subjective hyperthermia/hypothermia Eyes: no blurry vision, no xerophthalmia ENT: no sore throat, no nodules palpated in throat, no dysphagia/odynophagia, no hoarseness Cardiovascular: no CP/SOB/palpitations/leg swelling Respiratory: no cough/no SOB/wheezing Gastrointestinal: no N/V/D/C/+ acid reflux Musculoskeletal: no muscle aches/no joint aches Skin: no rash, no itching Neurological: no tremors/numbness/tingling/dizziness, no HA   Objective:   Physical Exam BP 124/66 mmHg  Pulse 75  Temp(Src) 97.6 F (36.4 C) (Oral)  Resp 12  Wt 308 lb (139.708 kg)  SpO2 95% Body mass index is 42.36 kg/(m^2). Wt Readings from Last 3 Encounters:  03/15/15 308 lb (139.708 kg)  12/15/14 308 lb (139.708 kg)  12/15/14 308 lb 11.2 oz (140.025 kg)   Constitutional: overweight, in NAD Eyes: PERRLA, EOMI, no  exophthalmos ENT: moist mucous membranes, no thyromegaly, no cervical lymphadenopathy Cardiovascular: RRR, No MRG, + B leg swelling Respiratory: CTA B Gastrointestinal: abdomen soft, NT, ND, BS+ Musculoskeletal: no deformities, strength intact in all 4 Skin: moist, warm Neurological: no tremor with outstretched hands, DTR normal in all 4  Assessment:     1. DM2, insulin-dependent, uncontrolled, with complications - nonobstructive CAD - CKD - diabetes neuropathy - diabetic retinopathy - PVD - R amputated toe    Plan:     Pt with uncontrolled diabetes, noncompliant  with insulin dosing. She still did not add lunchtime insulin despite repeated advise to do so. As a consequence, her predinner sugars are very high >> bedtime hyperglycemia >> am hyperglycemia. I did my best to explain the consequences of hyperglycemia and uncontrolled diabetes and I again advised her that she absolutely need to start lunchtime insulin. I do not see other solution to improve her predinner sugars (especially in the presence of CKD which limits our medication choices) and I advised her that if she still does not implement this before next visit, there is no point to coming back to see me because I am not able to help her. She promises me that she will start lunchtime insulin. - I advised her to: Patient Instructions  Please change the insulin regimen as follows: - Lantus 40 units 2x a day - Humalog dose: - breakfast: 25 units - lunch - please add:  15 units for a salad  20 units for a sandwich - dinner: 25 units  - Humalog Sliding scale: - 150- 165: + 1 unit  - 166- 180: + 2 units  - 181- 195: + 3 units  - 196- 210: + 4 units  - >210: + 5 units   Please return in 1.5 months with your sugar log.   - given flu vaccine this season - needs a new eye exam >> needs one - HbA1C >> 8.7% (higher) - RTC in 1.5 mo with sugar log

## 2015-03-15 NOTE — Telephone Encounter (Signed)
Patients ins would not cover Humalog. Switched to [employer], ok per Dr Cruzita Lederer. Sent to pt's pharmacy.

## 2015-03-15 NOTE — Patient Instructions (Signed)
Please change the insulin regimen as follows: - Lantus 40 units 2x a day - Humalog dose: - breakfast: 25 units - lunch - please add:  15 units for a salad  20 units for a sandwich - dinner: 25 units  - Humalog Sliding scale: - 150- 165: + 1 unit  - 166- 180: + 2 units  - 181- 195: + 3 units  - 196- 210: + 4 units  - >210: + 5 units   Please return in 1.5 months with your sugar log.

## 2015-03-19 LAB — HM DIABETES EYE EXAM

## 2015-03-28 ENCOUNTER — Other Ambulatory Visit: Payer: Self-pay | Admitting: *Deleted

## 2015-03-28 MED ORDER — INSULIN GLARGINE 100 UNIT/ML SOLOSTAR PEN: PEN_INJECTOR | SUBCUTANEOUS | Status: DC

## 2015-03-28 NOTE — Telephone Encounter (Signed)
Ins no longer covers Lantus. Switching to Basaglar per Dr Cruzita Lederer.

## 2015-03-30 ENCOUNTER — Telehealth: Payer: Self-pay | Admitting: Internal Medicine

## 2015-03-30 NOTE — Telephone Encounter (Signed)
Pt needs refill on lantus but her insurance will not cover, pt is not aware of the alternate called into [facility] in [place]

## 2015-03-30 NOTE — Telephone Encounter (Signed)
Called pt and advised her that Dr Cruzita Lederer has switched her to Nancee Liter (it is a generic for Lantus) to her pharmacy, [facility] in [place] on Monday. Pt stated that her pharmacy advised her that they sent a fax to [place] 2 weeks ago. Advised pt that we had not received anything until the end of last week. Pt voiced understanding.

## 2015-04-29 ENCOUNTER — Ambulatory Visit (INDEPENDENT_AMBULATORY_CARE_PROVIDER_SITE_OTHER): Payer: BLUE CROSS/BLUE SHIELD | Admitting: Internal Medicine

## 2015-04-29 ENCOUNTER — Encounter: Payer: Self-pay | Admitting: Internal Medicine

## 2015-04-29 VITALS — BP 122/80 | HR 74 | Temp 97.9°F | Resp 12 | Wt 306.0 lb

## 2015-04-29 DIAGNOSIS — E1122 Type 2 diabetes mellitus with diabetic chronic kidney disease: Secondary | ICD-10-CM

## 2015-04-29 DIAGNOSIS — E1165 Type 2 diabetes mellitus with hyperglycemia: Secondary | ICD-10-CM | POA: Diagnosis not present

## 2015-04-29 DIAGNOSIS — N183 Chronic kidney disease, stage 3 (moderate): Secondary | ICD-10-CM | POA: Diagnosis not present

## 2015-04-29 DIAGNOSIS — IMO0002 Reserved for concepts with insufficient information to code with codable children: Secondary | ICD-10-CM

## 2015-04-29 DIAGNOSIS — Z794 Long term (current) use of insulin: Secondary | ICD-10-CM

## 2015-04-29 NOTE — Patient Instructions (Signed)
Please continue the insulin regimen as follows: - Lantus 40 units 2x a day - Humalog dose: - breakfast: 25 units - lunch - please increase:  15 units for a salad  20 units for a sandwich - dinner: 25 units  - Humalog Sliding scale: - 150- 165: + 1 unit  - 166- 180: + 2 units  - 181- 195: + 3 units  - 196- 210: + 4 units  - >210: + 5 units   Please return in 1.5 months with your sugar log.

## 2015-04-29 NOTE — Progress Notes (Signed)
Subjective:     Patient ID: Savannah Crawford, female   DOB: 05/30/1955, 60 y.o.   MRN: [id number]  HPI Savannah Crawford is a 60 y.o. woman returning for f/u for DM2, insulin-dependent, uncontrolled, with complications (CAD, CKD, diabetes neuropathy, diabetic retinopathy, PVD, amputated toe-R). Last visit 3 mo ago.   Last hemoglobin A1c was: Lab Results  Component Value Date   HGBA1C 8.7 03/15/2015   HGBA1C 7.8 12/15/2014   HGBA1C 8.5* 09/14/2014   She is on a regimen of: - Lantus (tried Toujeo but highs in am) 40 units 2x a day - Humalog dose: - breakfast: 25 units - lunch - started after last visit  15 units for a salad  20 units for a regular mreal - dinner: 25 units  - Humalog Sliding scale: - 150- 165: + 1 unit  - 166- 180: + 2 units  - 181- 195: + 3 units  - 196- 210: + 4 units  - >210: + 5 units  She was on metformin in the past, but stopped due to chronic kidney disease.   She checks her sugars 2x a day. Per meter download - better: - am: 170-190 on Toujeo, in the last month: 98-145, on Lantus >> 83, 115-173 >> 128-185 >> 113-268, 306 >> 102-169, 200s if forgets insulin for lunch the day before - prelunch: 98-120 >> n/c >> 117-120 >> 200 >> 201 >> 178, 220 >> 106-176 - predinner: 178-190 >> 178-190 >> 149-228 >> 121, 154-255, 338, 443 >> 155-396 >> 85, 99-247, 308 - bedtime: 183-334, 461x1 >> 98, 241, 301 >> 148-404 >> 294 No lows. She has hypoglycemia awareness. Highest sugar 433 (no meds) >> 476 >> 308 since last visit.   She is also seen by nephrology for her chronic kidney disease.  Lab Results  Component Value Date   BUN 27* 12/15/2014   CREATININE 1.62* 12/15/2014  She is on Lisinopril 5 bid >> 1x a day  Her cholesterol levels are at goal: Lab Results  Component Value Date   CHOL 139 12/15/2014   HDL 50.10 12/15/2014   LDLCALC 74 12/15/2014   TRIG 73.0 12/15/2014   CHOLHDL 3 12/15/2014  She is on Lipitor 20.  Has DR, last eye exam in 03/19/2015. No DR. Has  numbness and tingling in feet. She sees podiatry  (Dr. Melony Overly) for L 3rd toe ulcer.  She also has a history of nonobstructive CAD, with the positive stress test but a negative cardiac cath 2012, and also has diastolic heart failure. She has hypertension, hyperlipidemia, venous stasis, vitamin D deficiency - started vit D high dose.  I reviewed pt's medications, allergies, PMH, social hx, family hx, and changes were documented in the history of present illness. Otherwise, unchanged from my initial visit note.  Review of Systems Constitutional: no weight gain, no fatigue, no subjective hyperthermia/hypothermia Eyes: no blurry vision, no xerophthalmia ENT: no sore throat, no nodules palpated in throat, no dysphagia/odynophagia, no hoarseness Cardiovascular: no CP/SOB/palpitations/leg swelling Respiratory: no cough/no SOB/wheezing Gastrointestinal: no N/V/D/C/+ acid reflux Musculoskeletal: no muscle aches/no joint aches Skin: no rash, no itching Neurological: no tremors/numbness/tingling/dizziness, no HA   Objective:   Physical Exam BP 122/80 mmHg  Pulse 74  Temp(Src) 97.9 F (36.6 C) (Oral)  Resp 12  Wt 306 lb (138.801 kg)  SpO2 94% Body mass index is 42.09 kg/(m^2). Wt Readings from Last 3 Encounters:  04/29/15 306 lb (138.801 kg)  03/15/15 308 lb (139.708 kg)  12/15/14 308 lb (139.708 kg)  Constitutional: overweight, in NAD Eyes: PERRLA, EOMI, no exophthalmos ENT: moist mucous membranes, no thyromegaly, no cervical lymphadenopathy Cardiovascular: RRR, No MRG, + B leg swelling Respiratory: CTA B Gastrointestinal: abdomen soft, NT, ND, BS+ Musculoskeletal: no deformities, strength intact in all 4 Skin: moist, warm Neurological: no tremor with outstretched hands, DTR normal in all 4  Assessment:     1. DM2, insulin-dependent, uncontrolled, with complications - nonobstructive CAD - CKD - diabetes neuropathy - diabetic retinopathy - PVD - R amputated toe    Plan:     Pt  with uncontrolled diabetes, noncompliant with insulin dosing. She finally started to bolus before lunch, but still misses many doses of insulin. Her predinner CBGs (and the rest of the sugars) are better, but still high >> will increase slightly the lunchtime Humalog doses, but compliance is key for her. - reviewed HbA1C from last visit >> 8.7% (higher) - I advised her to: Patient Instructions  Please continue the insulin regimen as follows: - Lantus 40 units 2x a day - Humalog dose: - breakfast: 25 units - lunch - please increase:  15 units for a salad  20 units for a sandwich - dinner: 25 units  - Humalog Sliding scale: - 150- 165: + 1 unit  - 166- 180: + 2 units  - 181- 195: + 3 units  - 196- 210: + 4 units  - >210: + 5 units   Please return in 1.5 months with your sugar log.   - given flu vaccine this season - needs a new eye exam >> UTD >> will call to get the report - RTC in 1.5 mo with sugar log

## 2015-05-10 ENCOUNTER — Other Ambulatory Visit: Payer: Self-pay | Admitting: *Deleted

## 2015-05-10 MED ORDER — GLUCOSE BLOOD VI STRP: ORAL_STRIP | Status: DC

## 2015-05-24 ENCOUNTER — Ambulatory Visit (INDEPENDENT_AMBULATORY_CARE_PROVIDER_SITE_OTHER): Payer: BLUE CROSS/BLUE SHIELD | Admitting: Family Medicine

## 2015-05-24 ENCOUNTER — Encounter: Payer: Self-pay | Admitting: Family Medicine

## 2015-05-24 VITALS — BP 138/76 | HR 83 | Temp 98.7°F | Ht 71.5 in | Wt 306.6 lb

## 2015-05-24 DIAGNOSIS — S91331A Puncture wound without foreign body, right foot, initial encounter: Secondary | ICD-10-CM | POA: Diagnosis not present

## 2015-05-24 DIAGNOSIS — IMO0002 Reserved for concepts with insufficient information to code with codable children: Secondary | ICD-10-CM

## 2015-05-24 DIAGNOSIS — E1165 Type 2 diabetes mellitus with hyperglycemia: Secondary | ICD-10-CM

## 2015-05-24 DIAGNOSIS — L03115 Cellulitis of right lower limb: Secondary | ICD-10-CM | POA: Diagnosis not present

## 2015-05-24 DIAGNOSIS — E1122 Type 2 diabetes mellitus with diabetic chronic kidney disease: Secondary | ICD-10-CM | POA: Diagnosis not present

## 2015-05-24 DIAGNOSIS — N183 Chronic kidney disease, stage 3 (moderate): Secondary | ICD-10-CM

## 2015-05-24 DIAGNOSIS — Z794 Long term (current) use of insulin: Secondary | ICD-10-CM

## 2015-05-24 MED ORDER — DOXYCYCLINE HYCLATE 100 MG PO CAPS: 100.0000 mg | ORAL_CAPSULE | Freq: Two times a day (BID) | ORAL | Status: DC

## 2015-05-24 NOTE — Patient Instructions (Signed)
Before you leave: -appointment details for podiatry visit  Start antibiotic immediately  Please make sure you arrive early for your podiatry office visit.  Please seek care immediately in the interim if you experience worsening redness or swelling, fevers you have, you are feeling sick or you feel that the infection is worsening.

## 2015-05-24 NOTE — Progress Notes (Signed)
HPI:  Savannah Crawford is a pleasant 60 yo, unfortunately with poor compliance, longstanding uncontrolled diabetes despite notable efforts to assist by both my self and now her endocrinologist, obesity, HLD, HLD, diabetic neuropathy, venous stasis, diastolic CHF, hx diabetic ulcer here for an acute visit for foot pain. She reports she stepped on a toothpick one week ago and since then has developed pain, swelling and redness in this area and right foot around the 1st MCP joint. She sees a podiatrist for foot care, and has a history of diabetic ulcer and amputation. Denies fevers, malaise, redness spreading up the leg, chills.  ROS: See pertinent positives and negatives per HPI.  Past Medical History  Diagnosis Date  . Diabetes mellitus with renal manifestations, uncontrolled ([place])   . Hypertension   . Obesity   . Hyperlipidemia   . Peripheral arterial disease? Eval by vascular 2013 with no LE PAD and advised to stop pletal   . Diabetic neuropathy ([place]) 01/09/2012  . Venous stasis of lower extremity 03/10/2012  . Diastolic heart failure - stage 1 per review of echo results from 02/2010 01/15/2012  . At risk for heart disease - +stres test 2012 followed by [facility], [place] with neg cardiac cath 03/2010 (non-obstructive CAD) per review of records, echo 02/2010 normal LVF 01/15/2012  . Toe ulcer, right ([place])   . Chronic kidney disease (CKD), stage IV (severe) ([place]) 01/09/2012  . Anemia of chronic disease 11/17/2012  . Severe obesity (BMI >= 40) ([place]) 11/13/2012  . Ulcer of toe of left foot ([place])     treated by [facility] per patient    Past Surgical History  Procedure Laterality Date  . Toe amputation  2008  . Eye surgery      Family History  Problem Relation Age of Onset  . Obesity Other   . Hypertension Other   . Hyperlipidemia Other   . Stroke Other   . Heart attack Other   . Diabetes Mother   . Hyperlipidemia Mother   . Hypertension Mother   . Heart attack Mother   .  Stroke Mother   . Diabetes Sister   . Hypertension Brother   . Diabetes Sister   . Diabetes Sister   . Diabetes Sister     Social History   Social History  . Marital Status: Married    Spouse Name: N/A  . Number of Children: N/A  . Years of Education: N/A   Social History Main Topics  . Smoking status: Former Smoker    Types: Cigarettes    Quit date: 02/20/1999  . Smokeless tobacco: Never Used  . Alcohol Use: Yes  . Drug Use: Yes  . Sexual Activity: Not Asked   Other Topics Concern  . None   Social History Narrative     Current outpatient prescriptions:  .  atorvastatin (LIPITOR) 20 MG tablet, TAKE 1 TABLET (20 MG TOTAL) BY MOUTH DAILY., Disp: 90 tablet, Rfl: 3 .  B-D ULTRAFINE III SHORT PEN 31G X 8 MM MISC, USE AS DIRECTED., Disp: 100 each, Rfl: 2 .  ferrous sulfate (CVS IRON) 325 (65 FE) MG tablet, TAKE 1 TALBET BY MOUTH TWICE DAILY WITH A MEAL, Disp: 60 tablet, Rfl: 1 .  furosemide (LASIX) 40 MG tablet, Take 1 tablet (40 mg total) by mouth 2 (two) times daily. (Patient taking differently: Take 40 mg by mouth daily. ), Disp: 180 tablet, Rfl: 3 .  glucose blood (ONE TOUCH ULTRA TEST) test strip, Use to  test blood sugar 2 times daily as instructed., Disp: 200 each, Rfl: 3 .  insulin aspart (NOVOLOG FLEXPEN) 100 UNIT/ML FlexPen, INJECT UP TO 80 UNITS DAILY AS INSTRUCTED., Disp: 30 mL, Rfl: 2 .  Insulin Glargine (BASAGLAR KWIKPEN) 100 UNIT/ML Solostar Pen, Inject under skin 40 units 2x a day as instructed., Disp: 30 mL, Rfl: 2 .  Insulin Pen Needle (B-D ULTRAFINE III SHORT PEN) 31G X 8 MM MISC, Use as directed., Disp: 100 each, Rfl: 5 .  lisinopril (PRINIVIL,ZESTRIL) 5 MG tablet, Take 5 mg by mouth daily. , Disp: , Rfl:  .  metoprolol succinate (TOPROL-XL) 25 MG 24 hr tablet, TAKE 1 TABLET (25 MG TOTAL) BY MOUTH DAILY., Disp: 90 tablet, Rfl: 3 .  triamcinolone cream (KENALOG) 0.1 %, Apply 1 application topically 2 (two) times daily. Mix with moisturizing cream and apply to  legs twice daily., Disp: 80 g, Rfl: 0 .  Vitamin D, Ergocalciferol, (DRISDOL) 50000 UNITS CAPS, Take 1 Units by mouth every 30 (thirty) days. , Disp: , Rfl:  .  doxycycline (VIBRAMYCIN) 100 MG capsule, Take 1 capsule (100 mg total) by mouth 2 (two) times daily., Disp: 10 capsule, Rfl: 0  EXAM:  Filed Vitals:   05/24/15 0930  BP: 138/76  Pulse: 83  Temp: 98.7 F (37.1 C)    Body mass index is 42.17 kg/(m^2).  GENERAL: vitals reviewed and listed above, alert, oriented, appears well hydrated and in no acute distress  HEENT: atraumatic, conjunttiva clear, no obvious abnormalities on inspection of external nose and ears  NECK: no obvious masses on inspection  LUNGS: clear to auscultation bilaterally, no wheezes, rales or rhonchi, good air movement  CV: HRRR, no peripheral edema  MS: moves all extremities without noticeable abnormality  SKIN: Healed puncture wound over callus of right ant plantar foot, mild surrounding swelling, tenderness and erythema of the ant medial foot. Normal cap refill. No drainage. Possible sub-callus fluid collection.  PSYCH: pleasant and cooperative, no obvious depression or anxiety  ASSESSMENT AND PLAN:  Discussed the following assessment and plan:  Penetrating foot wound, right, initial encounter  Cellulitis of right lower extremity  Uncontrolled type 2 diabetes mellitus with stage 3 chronic kidney disease, with long-term current use of insulin ([place])  -Concern for cellulitis - needs prompt evaluation with her podiatrest for potential deeper infection given callus, wound, uncontrolled diabetes -Advised prompt podiatry evaluation and care; had office assistant contact podiatry office to assist - they can see promptly in am. Appt details provided. -start abx in interim and advised emergency eval if spreading redness, fevers, malaise, etc -Discussed  importance of prompt evaluation for any foot lesions given her uncontrolled diabetes -advised to  return or notify a doctor immediately if symptoms worsen or persist or new concerns arise.  Patient Instructions  Before you leave: -appointment details for podiatry visit  Start antibiotic immediately  Please make sure you arrive early for your podiatry office visit.  Please seek care immediately in the interim if you experience worsening redness or swelling, fevers you have, you are feeling sick or you feel that the infection is worsening.      Colin Benton R.

## 2015-05-24 NOTE — Progress Notes (Signed)
Pre visit review using our clinic review tool, if applicable. No additional management support is needed unless otherwise documented below in the visit note. 

## 2015-06-16 ENCOUNTER — Ambulatory Visit: Payer: BLUE CROSS/BLUE SHIELD | Admitting: Internal Medicine

## 2015-06-24 ENCOUNTER — Other Ambulatory Visit: Payer: Self-pay | Admitting: Family Medicine

## 2015-06-24 MED ORDER — FUROSEMIDE 40 MG PO TABS
40.0000 mg | ORAL_TABLET | Freq: Two times a day (BID) | ORAL | Status: DC
Start: 2015-06-24 — End: 2016-07-16

## 2015-07-19 ENCOUNTER — Other Ambulatory Visit: Payer: Self-pay | Admitting: Internal Medicine

## 2015-08-22 ENCOUNTER — Other Ambulatory Visit: Payer: Self-pay

## 2015-08-22 MED ORDER — INSULIN GLARGINE 100 UNIT/ML SOLOSTAR PEN: PEN_INJECTOR | SUBCUTANEOUS | Status: DC

## 2015-09-25 ENCOUNTER — Other Ambulatory Visit: Payer: Self-pay | Admitting: Family Medicine

## 2015-10-06 ENCOUNTER — Other Ambulatory Visit: Payer: Self-pay | Admitting: *Deleted

## 2015-10-06 MED ORDER — LISINOPRIL 5 MG PO TABS: ORAL_TABLET | ORAL | 1 refills | Status: DC

## 2015-10-06 NOTE — Telephone Encounter (Signed)
Rx done. 

## 2015-11-23 ENCOUNTER — Other Ambulatory Visit: Payer: Self-pay | Admitting: Internal Medicine

## 2016-01-04 ENCOUNTER — Ambulatory Visit (INDEPENDENT_AMBULATORY_CARE_PROVIDER_SITE_OTHER): Payer: BLUE CROSS/BLUE SHIELD

## 2016-01-04 DIAGNOSIS — Z23 Encounter for immunization: Secondary | ICD-10-CM | POA: Diagnosis not present

## 2016-01-15 ENCOUNTER — Other Ambulatory Visit: Payer: Self-pay | Admitting: Family Medicine

## 2016-01-18 NOTE — Progress Notes (Signed)
HPI:   Savannah Crawford is a pleasant 60 yo with a complicated PMH significant for uncontrolled DM with numerous complications (retinopathy, hc diabetic ulcer and amputation, CKD) - managed by endocrinologist, CKD (sees nephrologist at [facility]), HTN, HLD, obesity and poor compliance here for her physical. Did not take any of her blood pressure medications today. Reports has appt with endocrine scheduled. Trying to eat better and get some exercise. Seeing podiatrist, Dr. Melony Overly for frequent exams/treatment wound on L 3rd toe - going to appt there today. Fasting for labs. Due for pap, endocrine follow up, labs, shingles vaccine. Declined shingles vaccine.  -Taking folic acid, vitamin D or calcium: no  -pap history: 06/2012, normal  -sexual activity: yes, female partner, no new partners  -wants STI testing (Hep C if born [birth year]): no  -FH breast, colon or ovarian ca: see FH Last mammogram: 11/2014 - she reports she has reminder to schedule and will call Last colon cancer screening: says 07/2011 in [place], but I do not have report  -Alcohol, Tobacco, drug use: see social history  Review of Systems - no fevers, unintentional weight loss, vision loss, hearing loss, chest pain, sob, hemoptysis, melena, hematochezia, hematuria, genital discharge, changing or concerning skin lesions, bleeding, bruising, loc, thoughts of self harm or SI  Past Medical History:  Diagnosis Date  . Anemia of chronic disease 11/17/2012  . At risk for heart disease - +stres test 2012 followed by [facility], [place] with neg cardiac cath 03/2010 (non-obstructive CAD) per review of records, echo 02/2010 normal LVF 01/15/2012  . Chronic kidney disease (CKD), stage IV (severe) ([place]) 01/09/2012  . Diabetes mellitus with renal manifestations, uncontrolled ([place])   . Diabetic neuropathy ([place]) 01/09/2012  . Diastolic heart failure - stage 1 per review of echo results from 02/2010 01/15/2012  . Hyperlipidemia   . Hypertension    . Obesity   . Peripheral arterial disease? Eval by vascular 2013 with no LE PAD and advised to stop pletal   . Severe obesity (BMI >= 40) ([place]) 11/13/2012  . Toe ulcer, right ([place])   . Ulcer of toe of left foot ([place])    treated by [facility] per patient  . Venous stasis of lower extremity 03/10/2012    Past Surgical History:  Procedure Laterality Date  . EYE SURGERY    . TOE AMPUTATION  2008    Family History  Problem Relation Age of Onset  . Obesity Other   . Hypertension Other   . Hyperlipidemia Other   . Stroke Other   . Heart attack Other   . Diabetes Mother   . Hyperlipidemia Mother   . Hypertension Mother   . Heart attack Mother   . Stroke Mother   . Diabetes Sister   . Hypertension Brother   . Diabetes Sister   . Diabetes Sister   . Diabetes Sister     Social History   Social History  . Marital status: Married    Spouse name: N/A  . Number of children: N/A  . Years of education: N/A   Social History Main Topics  . Smoking status: Former Smoker    Types: Cigarettes    Quit date: 02/20/1999  . Smokeless tobacco: Never Used  . Alcohol use Yes  . Drug use:   . Sexual activity: Not Asked   Other Topics Concern  . None   Social History Narrative  . None     Current Outpatient Prescriptions:  .  atorvastatin (LIPITOR) 20  MG tablet, TAKE 1 TABLET (20 MG TOTAL) BY MOUTH DAILY., Disp: 90 tablet, Rfl: 1 .  B-D ULTRAFINE III SHORT PEN 31G X 8 MM MISC, USE AS DIRECTED., Disp: 100 each, Rfl: 2 .  ferrous sulfate (CVS IRON) 325 (65 FE) MG tablet, TAKE 1 TALBET BY MOUTH TWICE DAILY WITH A MEAL, Disp: 60 tablet, Rfl: 1 .  furosemide (LASIX) 40 MG tablet, Take 1 tablet (40 mg total) by mouth 2 (two) times daily., Disp: 180 tablet, Rfl: 3 .  glucose blood (ONE TOUCH ULTRA TEST) test strip, Use to test blood sugar 2 times daily as instructed., Disp: 200 each, Rfl: 3 .  Insulin Glargine (LANTUS) 100 UNIT/ML Solostar Pen, Inject under skin 40 units 2x a day as  instructed., Disp: 30 mL, Rfl: 2 .  Insulin Pen Needle (B-D ULTRAFINE III SHORT PEN) 31G X 8 MM MISC, Use as directed., Disp: 100 each, Rfl: 5 .  lisinopril (PRINIVIL,ZESTRIL) 5 MG tablet, Take 5 mg by mouth daily. , Disp: , Rfl:  .  metoprolol succinate (TOPROL-XL) 25 MG 24 hr tablet, TAKE 1 TABLET (25 MG TOTAL) BY MOUTH DAILY., Disp: 90 tablet, Rfl: 1 .  NOVOLOG FLEXPEN 100 UNIT/ML FlexPen, INJECT UP TO 80 UNITS DAILY AS INSTRUCTED., Disp: 30 mL, Rfl: 1 .  triamcinolone cream (KENALOG) 0.1 %, Apply 1 application topically 2 (two) times daily. Mix with moisturizing cream and apply to legs twice daily., Disp: 80 g, Rfl: 0  EXAM:  Vitals:   01/19/16 0922  BP: (!) 144/74  Pulse: 66  Temp: 97.8 F (36.6 C)   Body mass index is 42.3 kg/m.  GENERAL: vitals reviewed and listed below, alert, oriented, appears well hydrated and in no acute distress  HEENT: head atraumatic, PERRLA, normal appearance of eyes, ears, nose and mouth. moist mucus membranes.  NECK: supple, no masses or lymphadenopathy  LUNGS: clear to auscultation bilaterally, no rales, rhonchi or wheeze  CV: HRRR, no peripheral edema or cyanosis, normal pedal pulses  BREAST: normal appearance - no lesions or discharge, on palpation normal breast tissue without any suspicious masses  ABDOMEN: bowel sounds normal, soft, non tender to palpation, no masses, no rebound or guarding  GU: normal appearance of external genitalia - no lesions or masses, normal vaginal mucosa - no abnormal discharge, normal appearance of cervix - no lesions or abnormal discharge, no masses or tenderness on palpation of uterus and ovaries. Pap obtained.  RECTAL: refused  SKIN: no rash or abnormal lesions - bandage on L middle toe  MS: normal gait, moves all extremities normally  NEURO: normal gait, speech and thought processing grossly intact, muscle tone grossly intact throughout  PSYCH: normal affect, pleasant and cooperative  ASSESSMENT AND  PLAN:  Discussed the following assessment and plan:  Encounter for preventive health examination  Essential hypertension - Plan: Basic metabolic panel, CBC (no diff)  Uncontrolled type 2 diabetes mellitus with stage 3 chronic kidney disease, with long-term current use of insulin (H[facility]) - Plan: Lipid Panel, Hemoglobin A1c  Other diabetic neurological complication associated with type 2 diabetes mellitus (H[facility])  Chronic kidney disease (CKD), stage IV (severe) ([facility])  Severe obesity (BMI >= 40) ([place])   -discussed importance of compliance with meds and healthy lifestyle  -advise mammo and endo follow up, podiatry follow up today per pt  -Discussed and advised all [place] preventive services health task force level A and B recommendations for age, sex and risks.  -Advised at least 150 minutes of exercise per week  and a healthy diet with avoidance of (less then 1 serving per week) processed foods, white starches, red meat, fast foods and sweets and consisting of: * 5-9 servings of fresh fruits and vegetables (not corn or potatoes) *nuts and seeds, beans *olives and olive oil *lean meats such as fish and white chicken  *whole grains  -labs, studies and vaccines per orders this encounter  Orders Placed This Encounter  Procedures  . Lipid Panel  . Hemoglobin A1c  . Basic metabolic panel  . CBC (no diff)    Patient advised to return to clinic immediately if symptoms worsen or persist or new concerns.  Patient Instructions  BEFORE YOU LEAVE: -labs -follow up: 3 months  Take blood pressure medications every morning and before next appointment.  Call today to schedule mammogram.  See your foot doctor today.  See your diabetes doctor on a regular basis.  We have ordered labs or studies at this visit. It can take up to 1-2 weeks for results and processing. IF results require follow up or explanation, we will call you with instructions. Clinically stable results will be released to  your [facility]. If you have not heard from [place] or cannot find your results in [facility] in 2 weeks please contact our office at [phone].  If you are not yet signed up for [facility], please consider signing up.   We recommend the following healthy lifestyle for LIFE: 1) Small portions.   Tip: eat off of a salad plate instead of a dinner plate.  Tip: It is ok to feel hungry after a meal - that likely means you ate an appropriate portion.  Tip: if you need more or a snack choose fruits, veggies and/or a handful of nuts or seeds.  2) Eat a healthy clean diet.  * Tip: Avoid (less then 1 serving per week): processed foods, sweets, sweetened drinks, white starches (rice, flour, bread, potatoes, pasta, etc), red meat, fast foods, butter  *Tip: CHOOSE instead   * 5-9 servings per day of fresh or frozen fruits and vegetables (but not corn, potatoes, bananas, canned or dried fruit)   *nuts and seeds, beans   *olives and olive oil   *small portions of lean meats such as fish and white chicken    *small portions of whole grains  3)Get at least 150 minutes of sweaty aerobic exercise per week.  4)Reduce stress - consider counseling, meditation and relaxation to balance other aspects of your life.         No Follow-up on file.  Colin Benton R., DO

## 2016-01-19 ENCOUNTER — Ambulatory Visit (INDEPENDENT_AMBULATORY_CARE_PROVIDER_SITE_OTHER): Payer: BLUE CROSS/BLUE SHIELD | Admitting: Family Medicine

## 2016-01-19 ENCOUNTER — Encounter: Payer: Self-pay | Admitting: Family Medicine

## 2016-01-19 ENCOUNTER — Other Ambulatory Visit (HOSPITAL_COMMUNITY)
Admission: RE | Admit: 2016-01-19 | Discharge: 2016-01-19 | Disposition: A | Discharge status: Home / Self Care | Payer: BLUE CROSS/BLUE SHIELD | Source: Ambulatory Visit | Attending: Family Medicine | Admitting: Family Medicine

## 2016-01-19 VITALS — BP 144/74 | HR 66 | Temp 97.8°F | Ht 70.5 in | Wt 299.0 lb

## 2016-01-19 DIAGNOSIS — E1149 Type 2 diabetes mellitus with other diabetic neurological complication: Secondary | ICD-10-CM

## 2016-01-19 DIAGNOSIS — E1165 Type 2 diabetes mellitus with hyperglycemia: Secondary | ICD-10-CM | POA: Diagnosis not present

## 2016-01-19 DIAGNOSIS — Z0000 Encounter for general adult medical examination without abnormal findings: Secondary | ICD-10-CM | POA: Diagnosis not present

## 2016-01-19 DIAGNOSIS — Z794 Long term (current) use of insulin: Secondary | ICD-10-CM | POA: Diagnosis not present

## 2016-01-19 DIAGNOSIS — Z01419 Encounter for gynecological examination (general) (routine) without abnormal findings: Secondary | ICD-10-CM | POA: Diagnosis present

## 2016-01-19 DIAGNOSIS — Z1151 Encounter for screening for human papillomavirus (HPV): Secondary | ICD-10-CM | POA: Diagnosis present

## 2016-01-19 DIAGNOSIS — N184 Chronic kidney disease, stage 4 (severe): Secondary | ICD-10-CM

## 2016-01-19 DIAGNOSIS — I10 Essential (primary) hypertension: Secondary | ICD-10-CM

## 2016-01-19 DIAGNOSIS — E1122 Type 2 diabetes mellitus with diabetic chronic kidney disease: Secondary | ICD-10-CM | POA: Diagnosis not present

## 2016-01-19 DIAGNOSIS — IMO0002 Reserved for concepts with insufficient information to code with codable children: Secondary | ICD-10-CM

## 2016-01-19 DIAGNOSIS — Z124 Encounter for screening for malignant neoplasm of cervix: Secondary | ICD-10-CM

## 2016-01-19 DIAGNOSIS — N183 Chronic kidney disease, stage 3 (moderate): Secondary | ICD-10-CM

## 2016-01-19 LAB — CBC
HCT: 36.4 % (ref 36.0–46.0)
Hemoglobin: 11.9 g/dL — ABNORMAL LOW (ref 12.0–15.0)
MCHC: 32.6 g/dL (ref 30.0–36.0)
MCV: 83 fl (ref 78.0–100.0)
Platelets: 271 10*3/uL (ref 150.0–400.0)
RBC: 4.38 Mil/uL (ref 3.87–5.11)
RDW: 14.3 % (ref 11.5–15.5)
WBC: 9.2 10*3/uL (ref 4.0–10.5)

## 2016-01-19 LAB — BASIC METABOLIC PANEL
BUN: 28 mg/dL — AB (ref 6–23)
CO2: 27 mEq/L (ref 19–32)
CREATININE: 1.8 mg/dL — AB (ref 0.40–1.20)
Calcium: 9.1 mg/dL (ref 8.4–10.5)
Chloride: 103 mEq/L (ref 96–112)
GFR: 36.89 mL/min — AB (ref >60.00)
Glucose, Bld: 216 mg/dL — ABNORMAL HIGH (ref 70–99)
Potassium: 4.1 mEq/L (ref 3.5–5.1)
Sodium: 138 mEq/L (ref 135–145)

## 2016-01-19 LAB — HEMOGLOBIN A1C: HEMOGLOBIN A1C: 9.2 % — AB (ref 4.6–6.5)

## 2016-01-19 LAB — LIPID PANEL
CHOL/HDL RATIO: 3
Cholesterol: 137 mg/dL (ref 0–200)
HDL: 48.3 mg/dL (ref >39.00)
LDL Cholesterol: 70 mg/dL (ref 0–99)
NONHDL: 89.03
Triglycerides: 94 mg/dL (ref 0.0–149.0)
VLDL: 18.8 mg/dL (ref 0.0–40.0)

## 2016-01-19 NOTE — Patient Instructions (Signed)
BEFORE YOU LEAVE: -labs -follow up: 3 months  Take blood pressure medications every morning and before next appointment.  Call today to schedule mammogram.  See your foot doctor today.  See your diabetes doctor on a regular basis.  We have ordered labs or studies at this visit. It can take up to 1-2 weeks for results and processing. IF results require follow up or explanation, we will call you with instructions. Clinically stable results will be released to your [facility]. If you have not heard from [place] or cannot find your results in [facility] in 2 weeks please contact our office at [phone].  If you are not yet signed up for [facility], please consider signing up.   We recommend the following healthy lifestyle for LIFE: 1) Small portions.   Tip: eat off of a salad plate instead of a dinner plate.  Tip: It is ok to feel hungry after a meal - that likely means you ate an appropriate portion.  Tip: if you need more or a snack choose fruits, veggies and/or a handful of nuts or seeds.  2) Eat a healthy clean diet.  * Tip: Avoid (less then 1 serving per week): processed foods, sweets, sweetened drinks, white starches (rice, flour, bread, potatoes, pasta, etc), red meat, fast foods, butter  *Tip: CHOOSE instead   * 5-9 servings per day of fresh or frozen fruits and vegetables (but not corn, potatoes, bananas, canned or dried fruit)   *nuts and seeds, beans   *olives and olive oil   *small portions of lean meats such as fish and white chicken    *small portions of whole grains  3)Get at least 150 minutes of sweaty aerobic exercise per week.  4)Reduce stress - consider counseling, meditation and relaxation to balance other aspects of your life.

## 2016-01-19 NOTE — Progress Notes (Signed)
Pre visit review using our clinic review tool, if applicable. No additional management support is needed unless otherwise documented below in the visit note. 

## 2016-01-19 NOTE — Addendum Note (Signed)
Addended by: Agnes Lawrence on: 01/19/2016 10:35 AM   Modules accepted: Orders

## 2016-01-23 LAB — CYTOLOGY - PAP
DIAGNOSIS: NEGATIVE
HPV (WINDOPATH): NOT DETECTED

## 2016-02-22 ENCOUNTER — Ambulatory Visit (INDEPENDENT_AMBULATORY_CARE_PROVIDER_SITE_OTHER): Payer: BLUE CROSS/BLUE SHIELD | Admitting: Internal Medicine

## 2016-02-22 ENCOUNTER — Encounter: Payer: Self-pay | Admitting: Internal Medicine

## 2016-02-22 VITALS — BP 132/74 | HR 66 | Ht 71.0 in | Wt 303.0 lb

## 2016-02-22 DIAGNOSIS — N183 Chronic kidney disease, stage 3 (moderate): Secondary | ICD-10-CM

## 2016-02-22 DIAGNOSIS — E1165 Type 2 diabetes mellitus with hyperglycemia: Secondary | ICD-10-CM | POA: Diagnosis not present

## 2016-02-22 DIAGNOSIS — Z794 Long term (current) use of insulin: Secondary | ICD-10-CM

## 2016-02-22 DIAGNOSIS — E1122 Type 2 diabetes mellitus with diabetic chronic kidney disease: Secondary | ICD-10-CM | POA: Diagnosis not present

## 2016-02-22 DIAGNOSIS — IMO0002 Reserved for concepts with insufficient information to code with codable children: Secondary | ICD-10-CM

## 2016-02-22 MED ORDER — INSULIN ASPART 100 UNIT/ML FLEXPEN: PEN_INJECTOR | SUBCUTANEOUS | 5 refills | Status: DC

## 2016-02-22 MED ORDER — INSULIN GLARGINE 100 UNIT/ML SOLOSTAR PEN: PEN_INJECTOR | SUBCUTANEOUS | 2 refills | Status: DC

## 2016-02-22 MED ORDER — BASAGLAR KWIKPEN 100 UNIT/ML ~~LOC~~ SOPN: PEN_INJECTOR | SUBCUTANEOUS | 5 refills | Status: DC

## 2016-02-22 NOTE — Progress Notes (Signed)
Subjective:     Patient ID: Savannah Crawford, female   DOB: [birth year], 61 y.o.   MRN: [id number]  HPI Savannah Crawford is a 61 y.o. woman returning for f/u for DM2, insulin-dependent, uncontrolled, with complications (CAD, CKD, diabetes neuropathy, diabetic retinopathy, PVD, amputated toe-R). Last visit 3 mo ago.   Last hemoglobin A1c was: Lab Results  Component Value Date   HGBA1C 9.2 (H) 01/19/2016   HGBA1C 8.7 03/15/2015   HGBA1C 7.8 12/15/2014   She is on a regimen of: - Basaglar(tried Toujeo but highs in am) 40 units 2x a day - Novolog dose: - breakfast: 25 units - lunch - increased at last visit:  15 units for a salad  20 units for a regular mreal - dinner: 25 units  - Novollog Sliding scale: - 150- 165: + 1 unit  - 166- 180: + 2 units  - 181- 195: + 3 units  - 196- 210: + 4 units  - >210: + 5 units  She was on metformin in the past, but stopped due to chronic kidney disease.   She checks her sugars 2x a day:: - am:  83, 115-173 >> 128-185 >> 113-268, 306 >> 102-169, 200s >> 78, 115-120s, 158 - prelunch: 98-120 >> n/c >> 117-120 >> 200 >> 201 >> 178, 220 >> 106-176 >> 115-170s - predinner: 149-228 >> 121, 154-255, 338, 443 >> 155-396 >> 85, 99-247, 308 >> 150-180, 200s - bedtime: 183-334, 461x1 >> 98, 241, 301 >> 148-404 >> 294 >> n/c Lowest: 78, She has hypoglycemia awareness.  Highest sugar 433 (no meds) >> 476 >> 308 >> 258.  She is also seen by nephrology for her chronic kidney disease.  Lab Results  Component Value Date   BUN 28 (H) 01/19/2016   CREATININE 1.80 (H) 01/19/2016  She is on Lisinopril 5 mg 1x a day  Her cholesterol levels are at goal: Lab Results  Component Value Date   CHOL 137 01/19/2016   HDL 48.30 01/19/2016   LDLCALC 70 01/19/2016   TRIG 94.0 01/19/2016   CHOLHDL 3 01/19/2016  She is on Lipitor 20.  Has DR, last eye exam in 03/19/2015. No DR. Has numbness and tingling in feet. She sees podiatry  (Dr. Melony Overly) for L 3rd toe ulcer.  She also has a  history of nonobstructive CAD, with the positive stress test but a negative cardiac cath 2012, and also has diastolic heart failure. She has hypertension, hyperlipidemia, venous stasis, vitamin D deficiency.  I reviewed pt's medications, allergies, PMH, social hx, family hx, and changes were documented in the history of present illness. Otherwise, unchanged from my initial visit note.  Review of Systems Constitutional: no weight gain, no fatigue, no subjective hyperthermia/hypothermia Eyes: no blurry vision, no xerophthalmia ENT: no sore throat, no nodules palpated in throat, no dysphagia/odynophagia, + hoarseness (URI) Cardiovascular: no CP/SOB/palpitations/leg swelling Respiratory: + cough/no SOB/wheezing Gastrointestinal: no N/V/D/C/acid reflux Musculoskeletal: no muscle aches/no joint aches Skin: no rash, no itching Neurological: no tremors/numbness/tingling/dizziness, no HA   Objective:   Physical Exam BP 132/74 (BP Location: Right Arm, Patient Position: Sitting)   Pulse 66   Ht 5\' 11"  (1.803 m)   Wt (!) 303 lb (137.4 kg)   SpO2 95%   BMI 42.26 kg/m  Body mass index is 42.26 kg/m. Wt Readings from Last 3 Encounters:  02/22/16 (!) 303 lb (137.4 kg)  01/19/16 299 lb (135.6 kg)  05/24/15 (!) 306 lb 9.6 oz (139.1 kg)   Constitutional: overweight, in NAD Eyes:  PERRLA, EOMI, no exophthalmos ENT: moist mucous membranes, no thyromegaly, no cervical lymphadenopathy Cardiovascular: RRR, No MRG, + B leg swelling Respiratory: CTA B Gastrointestinal: abdomen soft, NT, ND, BS+ Musculoskeletal: no deformities, strength intact in all 4 Skin: moist, warm Neurological: no tremor with outstretched hands, DTR normal in all 4  Assessment:     1. DM2, insulin-dependent, uncontrolled, with complications - nonobstructive CAD - CKD - diabetes neuropathy - diabetic retinopathy - PVD - R amputated toe    Plan:     Pt with uncontrolled diabetes, on basal-bolus insulin regimen. At last  visit, we increase her insulin before lunch but she was working on compliance with getting the insulin doses in before the meals. Compliance is still key for her. Her sugars go up as the day goes by >> we will redistribute Basaglar with a higher dose in the morning and we'll also increase her breakfast and lunchtime NovoLog a little. I advised her to check some sugars at that time, to see if her NovoLog dose with dinner is adequate. - reviewed HbA1C from last visit >> 9.2% (higher) - I advised her to: Patient Instructions  Please increase: - Novolog dose: - breakfast: 28 units - lunch :  18 units for a salad  23 units for a sandwich - dinner: 25 units  - Novolog Sliding scale: - 150- 165: + 1 unit  - 166- 180: + 2 units  - 181- 195: + 3 units  - 196- 210: + 4 units  - >210: + 5 units   Please change Basaglar as follows: - 50 units in am and 30 units at night   Please return in 3 months with your sugar log.    - Had flu vaccine this season - Up to date with eye exams - Refilled her insulins. - RTC in 3 mo with sugar log   Philemon Kingdom, MD PhD [facility] Endocrinology

## 2016-02-22 NOTE — Patient Instructions (Addendum)
Please increase: - Novolog dose: - breakfast: 28 units - lunch :  18 units for a salad  23 units for a sandwich - dinner: 25 units  - Novolog Sliding scale: - 150- 165: + 1 unit  - 166- 180: + 2 units  - 181- 195: + 3 units  - 196- 210: + 4 units  - >210: + 5 units   Please change Basaglar as follows: - 50 units in am and 30 units at night   Please return in 3 months with your sugar log.

## 2016-04-19 ENCOUNTER — Ambulatory Visit: Payer: BLUE CROSS/BLUE SHIELD | Admitting: Family Medicine

## 2016-05-23 ENCOUNTER — Ambulatory Visit (INDEPENDENT_AMBULATORY_CARE_PROVIDER_SITE_OTHER): Payer: BLUE CROSS/BLUE SHIELD | Admitting: Internal Medicine

## 2016-05-23 ENCOUNTER — Encounter: Payer: Self-pay | Admitting: Internal Medicine

## 2016-05-23 VITALS — BP 148/82 | HR 72 | Ht 70.5 in | Wt 306.0 lb

## 2016-05-23 DIAGNOSIS — E1122 Type 2 diabetes mellitus with diabetic chronic kidney disease: Secondary | ICD-10-CM

## 2016-05-23 DIAGNOSIS — IMO0002 Reserved for concepts with insufficient information to code with codable children: Secondary | ICD-10-CM

## 2016-05-23 DIAGNOSIS — Z794 Long term (current) use of insulin: Secondary | ICD-10-CM | POA: Diagnosis not present

## 2016-05-23 DIAGNOSIS — N183 Chronic kidney disease, stage 3 (moderate): Secondary | ICD-10-CM

## 2016-05-23 DIAGNOSIS — E1165 Type 2 diabetes mellitus with hyperglycemia: Secondary | ICD-10-CM | POA: Diagnosis not present

## 2016-05-23 LAB — POCT GLYCOSYLATED HEMOGLOBIN (HGB A1C): Hemoglobin A1C: 8.1

## 2016-05-23 MED ORDER — BASAGLAR KWIKPEN 100 UNIT/ML ~~LOC~~ SOPN: PEN_INJECTOR | SUBCUTANEOUS | 5 refills | Status: DC

## 2016-05-23 NOTE — Progress Notes (Signed)
Subjective:     Patient ID: Savannah Crawford, female   DOB: [birth year], 61 y.o.   MRN: [id number]  HPI [name] is a 61 y.o. woman returning for f/u for DM2, insulin-dependent, uncontrolled, with complications (CAD, CKD, diabetes neuropathy, diabetic retinopathy, PVD, amputated toe-R). Last visit 3 mo ago.   Last hemoglobin A1c was: Lab Results  Component Value Date   HGBA1C 9.2 (H) 01/19/2016   HGBA1C 8.7 03/15/2015   HGBA1C 7.8 12/15/2014   She is on a regimen of: - Basaglar 45 units in am (not using the 50 units as recommended!) and 40 units at night (not using the 30 units as recommended!) - Novolog dose: - breakfast: 28 units - lunch  20-25 units - dinner: 25 units  - Novolog Sliding scale: - 150- 165: + 1 unit  - 166- 180: + 2 units  - 181- 195: + 3 units  - 196- 210: + 4 units  - >210: + 5 units  She was on metformin in the past, but stopped due to chronic kidney disease.   She checks her sugars 2x a day:: - am:   113-268, 306 >> 102-169, 200s >> 78, 115-120s, 158 >> 104-192, 234 (Higher sugars when she forgets insulin at night) - after b'fast: 128-183 - prelunch:  200 >> 201 >> 178, 220 >> 106-176 >> 115-170s >> 185 - predinner: 155-396 >> 85, 99-247, 308 >> 150-180, 200s >> 112-239 - bedtime: 183-334, 461x1 >> 98, 241, 301 >> 148-404 >> 294 >> n/c Lowest: 78 >> 70s (at night) x 2, She has hypoglycemia awareness.  Highest sugar 433 (no meds) >> 476 >> 308 >> 258 >> 239.  She is also seen by nephrology for her chronic kidney disease.  Lab Results  Component Value Date   BUN 28 (H) 01/19/2016   CREATININE 1.80 (H) 01/19/2016  She is on Lisinopril 5 mg 1x a day  Her cholesterol levels are at goal: Lab Results  Component Value Date   CHOL 137 01/19/2016   HDL 48.30 01/19/2016   LDLCALC 70 01/19/2016   TRIG 94.0 01/19/2016   CHOLHDL 3 01/19/2016  She is on Lipitor 20.  Has DR, last eye exam in 03/19/2015. No DR. Has numbness and tingling in feet. She sees podiatry   (Dr. Melony Overly) for L 3rd toe ulcer.  She also has a history of nonobstructive CAD, with positive stress test but a negative cardiac cath 2012, and also has diastolic heart failure. She has hypertension, hyperlipidemia, venous stasis, vitamin D deficiency.  I reviewed pt's medications, allergies, PMH, social hx, family hx, and changes were documented in the history of present illness. Otherwise, unchanged from my initial visit note.  Review of Systems Constitutional: + weight gain (3 lbs), no fatigue, no subjective hyperthermia/hypothermia Eyes: no blurry vision, no xerophthalmia ENT: no sore throat, no nodules palpated in throat, no dysphagia/odynophagia Cardiovascular: no CP/SOB/palpitations/leg swelling Respiratory: no cough/no SOB/wheezing Gastrointestinal: no N/V/D/C/+ acid reflux Musculoskeletal: no muscle aches/no joint aches, + neck stiffness Skin: no rash, no itching Neurological: no tremors/numbness/tingling/dizziness, no HA   Objective:   Physical Exam BP (!) 148/82 (BP Location: Left Arm, Patient Position: Sitting)   Pulse 72   Ht 5' 10.5" (1.791 m)   Wt (!) 306 lb (138.8 kg)   SpO2 98%   BMI 43.29 kg/m  Body mass index is 43.29 kg/m. Wt Readings from Last 3 Encounters:  05/23/16 (!) 306 lb (138.8 kg)  02/22/16 (!) 303 lb (137.4 kg)  01/19/16 299  lb (135.6 kg)   Constitutional: overweight, in NAD Eyes: PERRLA, EOMI, no exophthalmos ENT: moist mucous membranes, no thyromegaly, no cervical lymphadenopathy Cardiovascular: RRR, No MRG, + B leg swelling Respiratory: CTA B Gastrointestinal: abdomen soft, NT, ND, BS+ Musculoskeletal: no deformities, strength intact in all 4 Skin: moist, warm Neurological: no tremor with outstretched hands, DTR normal in all 4  Assessment:     1. DM2, insulin-dependent, uncontrolled, with complications - nonobstructive CAD - CKD - diabetes neuropathy - diabetic retinopathy - PVD - R amputated toe    Plan:     Pt with uncontrolled  diabetes, on basal-bolus insulin regimen. We adjusted her insulin doses at last visit, however, she did not change her Basaglar doses. She is not sure why ... She may still forget either the NovoLog or the Basaglar doses, and in those cases, her sugars are high, even in the 200s. Overall, though, she has slightly better sugars done at last visit. Since she had to instances of mild low blood sugars at night, I will again encourage her to decrease her Basaglar at night but will increase the morning dose. We'll also increase slightly the insulin with lunch. However, the key thing for her would be not to forget her insulin. - reviewed HbA1C from last visit >> 9.2% (higher) >> today, better: at 8.1% - I advised her to:  Patient Instructions  Please change: - Basaglar 50 units in am and 35 units at night  - Novolog dose: - breakfast: 28 units - lunch  25 units - dinner: 25 units  Please continue: - Novolog Sliding scale: - 150- 165: + 1 unit  - 166- 180: + 2 units  - 181- 195: + 3 units  - 196- 210: + 4 units  - >210: + 5 units   Please return in 3 months with your sugar log.    - Had flu vaccine this season - Up to date with eye exams - RTC in 3 mo with sugar log   Philemon Kingdom, MD PhD [facility] Endocrinology

## 2016-05-23 NOTE — Patient Instructions (Addendum)
Patient Instructions  Please change: - Basaglar 50 units in am and 35 units at night  - Novolog dose: - breakfast: 28 units - lunch  25 units - dinner: 25 units  Please continue: - Novolog Sliding scale: - 150- 165: + 1 unit  - 166- 180: + 2 units  - 181- 195: + 3 units  - 196- 210: + 4 units  - >210: + 5 units   Please return in 3 months with your sugar log.

## 2016-05-23 NOTE — Addendum Note (Signed)
Addended by: Caprice Beaver T on: 05/23/2016 10:02 AM   Modules accepted: Orders

## 2016-05-30 NOTE — Progress Notes (Signed)
HPI:  Savannah Crawford is a pleasant 61 yo with a Complicated PMH (see below), seeing several specialists, and poor compliance, here for an acute visit for: R shoulder pain: -started 3 weeks ago after a lot of light lifting above head, now much better -mod pain in R lat shoulder, occ pain in bilat traps -no radiation, weakness, numbness, malaise, fatigue -heat and ibuprofen help -no regular exercise, diet poor -seeing endo for diabetes, seeing nephrologist for kidneys  ROS: See pertinent positives and negatives per HPI.  Past Medical History:  Diagnosis Date  . Anemia of chronic disease 11/17/2012  . At risk for heart disease - +stres test 2012 followed by [facility], [place] with neg cardiac cath 03/2010 (non-obstructive CAD) per review of records, echo 02/2010 normal LVF 01/15/2012  . Chronic kidney disease (CKD), stage IV (severe) ([place]) 01/09/2012  . Diabetes mellitus with renal manifestations, uncontrolled ([place])   . Diabetic neuropathy ([place]) 01/09/2012  . Diastolic heart failure - stage 1 per review of echo results from 02/2010 01/15/2012  . Hyperlipidemia   . Hypertension   . Obesity   . Peripheral arterial disease? Eval by vascular 2013 with no LE PAD and advised to stop pletal   . Severe obesity (BMI >= 40) ([place]) 11/13/2012  . Toe ulcer, right ([place])   . Ulcer of toe of left foot ([place])    treated by [facility] per patient  . Venous stasis of lower extremity 03/10/2012    Past Surgical History:  Procedure Laterality Date  . EYE SURGERY    . TOE AMPUTATION  2008    Family History  Problem Relation Age of Onset  . Obesity Other   . Hypertension Other   . Hyperlipidemia Other   . Stroke Other   . Heart attack Other   . Diabetes Mother   . Hyperlipidemia Mother   . Hypertension Mother   . Heart attack Mother   . Stroke Mother   . Diabetes Sister   . Hypertension Brother   . Diabetes Sister   . Diabetes Sister   . Diabetes Sister     Social History    Social History  . Marital status: Married    Spouse name: N/A  . Number of children: N/A  . Years of education: N/A   Social History Main Topics  . Smoking status: Former Smoker    Types: Cigarettes    Quit date: 02/20/1999  . Smokeless tobacco: Never Used  . Alcohol use Yes  . Drug use: Yes  . Sexual activity: Not Asked   Other Topics Concern  . None   Social History Narrative  . None     Current Outpatient Prescriptions:  .  atorvastatin (LIPITOR) 20 MG tablet, TAKE 1 TABLET (20 MG TOTAL) BY MOUTH DAILY., Disp: 90 tablet, Rfl: 1 .  B-D ULTRAFINE III SHORT PEN 31G X 8 MM MISC, USE AS DIRECTED., Disp: 100 each, Rfl: 2 .  ferrous sulfate ([facility] IRON) 325 (65 FE) MG tablet, TAKE 1 TALBET BY MOUTH TWICE DAILY WITH A MEAL, Disp: 60 tablet, Rfl: 1 .  furosemide (LASIX) 40 MG tablet, Take 1 tablet (40 mg total) by mouth 2 (two) times daily., Disp: 180 tablet, Rfl: 3 .  glucose blood (ONE TOUCH ULTRA TEST) test strip, Use to test blood sugar 2 times daily as instructed., Disp: 200 each, Rfl: 3 .  insulin aspart (NOVOLOG FLEXPEN) 100 UNIT/ML FlexPen, INJECT UP TO 80 UNITS DAILY AS INSTRUCTED., Disp: 30 mL,  Rfl: 5 .  Insulin Glargine (BASAGLAR KWIKPEN) 100 UNIT/ML SOPN, Inject 85 units a day as advised, Disp: 10 pen, Rfl: 5 .  Insulin Pen Needle (B-D ULTRAFINE III SHORT PEN) 31G X 8 MM MISC, Use as directed., Disp: 100 each, Rfl: 5 .  lisinopril (PRINIVIL,ZESTRIL) 5 MG tablet, Take 5 mg by mouth daily. , Disp: , Rfl:  .  metoprolol succinate (TOPROL-XL) 25 MG 24 hr tablet, TAKE 1 TABLET (25 MG TOTAL) BY MOUTH DAILY., Disp: 90 tablet, Rfl: 1 .  triamcinolone cream (KENALOG) 0.1 %, Apply 1 application topically 2 (two) times daily. Mix with moisturizing cream and apply to legs twice daily., Disp: 80 g, Rfl: 0  EXAM:  Vitals:   05/31/16 0945  BP: 122/76  Pulse: 78  Temp: 98.6 F (37 C)    Body mass index is 42.97 kg/m.  GENERAL: vitals reviewed and listed above, alert, oriented,  appears well hydrated and in no acute distress  HEENT: atraumatic, conjunttiva clear, no obvious abnormalities on inspection of external nose and ears  NECK: no obvious masses on inspection  LUNGS: clear to auscultation bilaterally, no wheezes, rales or rhonchi, good air movement  CV: HRRR, no peripheral edema  MS: moves all extremities without noticeable abnormality, normal ROM head and neck and UEs bilat, normal inspectionhead/neck/shoulders except for head forward/shoulder forward posture, no bony or soft tissue TTP in the neck today, TTP in the R RTC tendon attachments to the humerus, normal strength and sensitivity to light touch throughout in upper ext bilat, neg shaw/neers/empty can, some pain with impingement test R, NV intact distal bilat UEs.  PSYCH: pleasant and cooperative, no obvious depression or anxiety  ASSESSMENT AND PLAN:  Discussed the following assessment and plan:  Acute pain of right shoulder -discussed potential etiologies, suspect RTC tendinopathy -opted for HEP, activity modification, ice, tylenol, topical pain tx -advise avoidance nsaids given renal disease -f/u 1 month  Neck pain -discussed potential etiologies, likely muscular/postural -no symptoms today -HEP, symptomatic care -f/u 1 month  Chronic kidney disease (CKD), stage IV (severe) ([facility]) -advised to avoid nsaids - she reports she did not know this  Severe obesity (BMI >= 40) ([facility]) -lifestyle recs, stressed importance healthy diet and regular exercise and wt reduction  -Patient advised to return or notify a doctor immediately if symptoms worsen or persist or new concerns arise.  Patient Instructions  BEFORE YOU LEAVE: -rotator cuff exercises -neck exercises -follow up: 1 month  Do the exercises 3-4 days per week.  Ice, topical sports creams with menthol or capsaicin (such as tiger balm), tyleonol 500mg  up to 3 times daily as needed for pain.  Avoid antiinflammatory medications which can  harm your kidneys (such as ibuprofen, aleve, naproxen, etc)   We recommend the following healthy lifestyle for LIFE: 1) Small portions.   Tip: eat off of a salad plate instead of a dinner plate.  Tip: It is ok to feel hungry after a meal   Tip: if you need more or a snack choose fruits, veggies and/or a handful of nuts or seeds.  2) Eat a healthy clean diet.  * Tip: Avoid (less then 1 serving per week): processed foods, sweets, sweetened drinks, white starches (rice, flour, bread, potatoes, pasta, etc), red meat, fast foods, butter  *Tip: CHOOSE instead   * 5-9 servings per day of fresh or frozen fruits and vegetables (but not corn, potatoes, bananas, canned or dried fruit)   *nuts and seeds, beans   *olives and olive  oil   *small portions of lean meats such as fish and white chicken    *small portions of whole grains  3)Get at least 150 minutes of sweaty aerobic exercise per week.  4)Reduce stress - consider counseling, meditation and relaxation to balance other aspects of your life.    Colin Benton R., DO

## 2016-05-31 ENCOUNTER — Ambulatory Visit (INDEPENDENT_AMBULATORY_CARE_PROVIDER_SITE_OTHER): Payer: BLUE CROSS/BLUE SHIELD | Admitting: Family Medicine

## 2016-05-31 ENCOUNTER — Encounter: Payer: Self-pay | Admitting: Family Medicine

## 2016-05-31 VITALS — BP 122/76 | HR 78 | Temp 98.6°F | Ht 70.5 in | Wt 303.8 lb

## 2016-05-31 DIAGNOSIS — N184 Chronic kidney disease, stage 4 (severe): Secondary | ICD-10-CM

## 2016-05-31 DIAGNOSIS — M542 Cervicalgia: Secondary | ICD-10-CM | POA: Diagnosis not present

## 2016-05-31 DIAGNOSIS — M25511 Pain in right shoulder: Secondary | ICD-10-CM | POA: Diagnosis not present

## 2016-05-31 NOTE — Progress Notes (Signed)
Pre visit review using our clinic review tool, if applicable. No additional management support is needed unless otherwise documented below in the visit note. 

## 2016-05-31 NOTE — Patient Instructions (Signed)
BEFORE YOU LEAVE: -rotator cuff exercises -neck exercises -follow up: 1 month  Do the exercises 3-4 days per week.  Ice, topical sports creams with menthol or capsaicin (such as tiger balm), tyleonol 500mg  up to 3 times daily as needed for pain.  Avoid antiinflammatory medications which can harm your kidneys (such as ibuprofen, aleve, naproxen, etc)   We recommend the following healthy lifestyle for LIFE: 1) Small portions.   Tip: eat off of a salad plate instead of a dinner plate.  Tip: It is ok to feel hungry after a meal   Tip: if you need more or a snack choose fruits, veggies and/or a handful of nuts or seeds.  2) Eat a healthy clean diet.  * Tip: Avoid (less then 1 serving per week): processed foods, sweets, sweetened drinks, white starches (rice, flour, bread, potatoes, pasta, etc), red meat, fast foods, butter  *Tip: CHOOSE instead   * 5-9 servings per day of fresh or frozen fruits and vegetables (but not corn, potatoes, bananas, canned or dried fruit)   *nuts and seeds, beans   *olives and olive oil   *small portions of lean meats such as fish and white chicken    *small portions of whole grains  3)Get at least 150 minutes of sweaty aerobic exercise per week.  4)Reduce stress - consider counseling, meditation and relaxation to balance other aspects of your life.

## 2016-06-29 ENCOUNTER — Ambulatory Visit: Payer: BLUE CROSS/BLUE SHIELD | Admitting: Family Medicine

## 2016-07-16 ENCOUNTER — Other Ambulatory Visit: Payer: Self-pay | Admitting: Family Medicine

## 2016-09-01 ENCOUNTER — Other Ambulatory Visit: Payer: Self-pay | Admitting: Family Medicine

## 2016-09-12 ENCOUNTER — Ambulatory Visit: Payer: BLUE CROSS/BLUE SHIELD | Admitting: Internal Medicine

## 2016-10-13 ENCOUNTER — Other Ambulatory Visit: Payer: Self-pay | Admitting: Family Medicine

## 2016-10-14 ENCOUNTER — Other Ambulatory Visit: Payer: Self-pay | Admitting: Internal Medicine

## 2016-10-14 ENCOUNTER — Other Ambulatory Visit: Payer: Self-pay | Admitting: Family Medicine

## 2016-10-24 ENCOUNTER — Other Ambulatory Visit: Payer: Self-pay | Admitting: Internal Medicine

## 2016-10-31 ENCOUNTER — Other Ambulatory Visit: Payer: Self-pay | Admitting: Family Medicine

## 2016-10-31 ENCOUNTER — Telehealth: Payer: Self-pay | Admitting: Internal Medicine

## 2016-10-31 ENCOUNTER — Other Ambulatory Visit: Payer: Self-pay

## 2016-10-31 MED ORDER — FREESTYLE LITE DEVI: 0 refills | Status: DC

## 2016-10-31 MED ORDER — GLUCOSE BLOOD VI STRP: ORAL_STRIP | 5 refills | Status: DC

## 2016-10-31 NOTE — Telephone Encounter (Signed)
MEDICATION:Freestyle Meter and strips  PHARMACY:  [id number]  [place], [place] A 90 DAY SUPPLY : no  IS PATIENT OUT OF MEDICATION: yes  IF NOT; HOW MUCH IS LEFT:   LAST APPOINTMENT DATE: 04/04  NEXT APPOINTMENT DATE:09/28  OTHER COMMENTS: Old meter not giving accurate readings   **Let patient know to contact pharmacy at the end of the day to make sure medication is ready. **  ** Please notify patient to allow 48-72 hours to process**  **Encourage patient to contact the pharmacy for refills or they can request refills through [facility]**

## 2016-10-31 NOTE — Telephone Encounter (Signed)
Submitted

## 2016-11-14 ENCOUNTER — Other Ambulatory Visit: Payer: Self-pay | Admitting: Family Medicine

## 2016-11-14 DIAGNOSIS — Z1231 Encounter for screening mammogram for malignant neoplasm of breast: Secondary | ICD-10-CM

## 2016-11-16 ENCOUNTER — Encounter: Payer: Self-pay | Admitting: Internal Medicine

## 2016-11-16 ENCOUNTER — Ambulatory Visit (INDEPENDENT_AMBULATORY_CARE_PROVIDER_SITE_OTHER): Payer: BLUE CROSS/BLUE SHIELD | Admitting: Internal Medicine

## 2016-11-16 VITALS — BP 138/84 | HR 65 | Temp 98.0°F | Wt 305.0 lb

## 2016-11-16 DIAGNOSIS — E1165 Type 2 diabetes mellitus with hyperglycemia: Secondary | ICD-10-CM

## 2016-11-16 DIAGNOSIS — N183 Chronic kidney disease, stage 3 (moderate): Secondary | ICD-10-CM

## 2016-11-16 DIAGNOSIS — E1122 Type 2 diabetes mellitus with diabetic chronic kidney disease: Secondary | ICD-10-CM

## 2016-11-16 DIAGNOSIS — E1142 Type 2 diabetes mellitus with diabetic polyneuropathy: Secondary | ICD-10-CM

## 2016-11-16 DIAGNOSIS — Z794 Long term (current) use of insulin: Secondary | ICD-10-CM

## 2016-11-16 DIAGNOSIS — IMO0002 Reserved for concepts with insufficient information to code with codable children: Secondary | ICD-10-CM

## 2016-11-16 LAB — POCT GLYCOSYLATED HEMOGLOBIN (HGB A1C): Hemoglobin A1C: 9.5

## 2016-11-16 MED ORDER — INSULIN ASPART 100 UNIT/ML FLEXPEN: PEN_INJECTOR | SUBCUTANEOUS | 5 refills | Status: DC

## 2016-11-16 NOTE — Progress Notes (Signed)
Subjective:     Patient ID: Savannah Crawford, female   DOB: 14-Jul-1955, 61 y.o.   MRN: [id number]  HPI [name] is a 61 y.o. woman returning for f/u for DM2, insulin-dependent, uncontrolled, with complications (CAD, CKD, diabetes neuropathy, diabetic retinopathy, PVD, amputated toe-R). Last visit 5.5 mo ago.  Last hemoglobin A1c was: Lab Results  Component Value Date   HGBA1C 8.1 05/23/2016   HGBA1C 9.2 (H) 01/19/2016   HGBA1C 8.7 03/15/2015   She is on: - Basaglar 30-40 units in am and 20-40 units at night (did not change to 50 units in am and 35 at night as advised at last visit...) (also misses it 2x a week!) - Novolog dose: - breakfast: 28 units - lunch  25 units - dinner: 25 units  - Novolog Sliding scale: - 150- 165: + 1 unit  - 166- 180: + 2 units  - 181- 195: + 3 units  - 196- 210: + 4 units  - >210: + 5 units  She was on metformin in the past, but stopped due to chronic kidney disease.  Tried Victoza before >> cannot remember effect.  She checks her sugars 1-2x a day: - am:  102-169, 200s >> 78, 115-120s, 158 >> 104-192, 234 >> 74, 120, 140-160 - after b'fast: 128-183 >> n/c - prelunch: 178, 220 >> 106-176 >> 115-170s >> 185 >> 170-180 - predinner: 85, 99-247, 308 >> 150-180, 200s >> 112-239 >> 200s if skips insulin  - bedtime: 1 98, 241, 301 >> 148-404 >> 294 >> n/c  Lowest: 78 >> 70s (at night) x 2 >> 74. Highest sugar 239 >> 280s.  Meter: Freestyle Lite  + CKD, sees nephrology: Lab Results  Component Value Date   BUN 28 (H) 01/19/2016   CREATININE 1.80 (H) 01/19/2016  On Lisinopril 10.  Latest lipids: Lab Results  Component Value Date   CHOL 137 01/19/2016   HDL 48.30 01/19/2016   LDLCALC 70 01/19/2016   TRIG 94.0 01/19/2016   CHOLHDL 3 01/19/2016  On Lipitor 20.  Last eye exam:  02/2015 >> No DR. + numbness and tingling in feet - stable She sees podiatry  (Dr. Melony Overly) for L 3rd toe ulcer.  She also has a history of nonobstructive CAD, with positive  stress test but a negative cardiac cath 2012, and also has diastolic heart failure. She has HTN, HL, venous stasis, vitamin D deficiency.  Review of Systems Constitutional: no weight gain/no weight loss, no fatigue, no subjective hyperthermia, no subjective hypothermia Eyes: no blurry vision, no xerophthalmia ENT: no sore throat, no nodules palpated in throat, no dysphagia, no odynophagia, no hoarseness Cardiovascular: no CP/no SOB/no palpitations/no leg swelling Respiratory: + cough and congestion/no SOB/no wheezing Gastrointestinal: no N/no V/no D/no C/no acid reflux Musculoskeletal: no muscle aches/no joint aches Skin: no rashes, no hair loss Neurological: no tremors/no numbness/no tingling/no dizziness  I reviewed pt's medications, allergies, PMH, social hx, family hx, and changes were documented in the history of present illness. Otherwise, unchanged from my initial visit note.  Objective:   Physical Exam BP 138/84 (BP Location: Left Arm, Patient Position: Sitting)   Pulse 65   Wt (!) 305 lb (138.3 kg)   SpO2 98%   BMI 43.14 kg/m  Body mass index is 43.14 kg/m. Wt Readings from Last 3 Encounters:  11/16/16 (!) 305 lb (138.3 kg)  05/31/16 (!) 303 lb 12.8 oz (137.8 kg)  05/23/16 (!) 306 lb (138.8 kg)   Constitutional: overweight, in NAD Eyes: PERRLA,  EOMI, no exophthalmos ENT: moist mucous membranes, no thyromegaly, no cervical lymphadenopathy Cardiovascular: RRR, No MRG, + B LE edema - pitting Respiratory: CTA B Gastrointestinal: abdomen soft, NT, ND, BS+ Musculoskeletal: no deformities, strength intact in all 4 Skin: moist, warm, no rashes Neurological: no tremor with outstretched hands, DTR normal in all 4  Assessment:     1. DM2, insulin-dependent, uncontrolled, with complications - nonobstructive CAD - CKD - diabetes neuropathy - diabetic retinopathy - PVD - R amputated toe  2. PN    Plan:     1. Pt with uncontrolled diabetes, on basal-bolus insulin  regimen only due to her CKD. We adjusted her insulin doses at last visit by increasing Basaglar in am and decreasing it at bedtime as she had mild low blood sugars at night (but she did not make the suggested changes) and increasing NovoLog dose with lunch as she had higher blood sugars before dinner. - She is still forgetting insulin doses and we again discussed about improving compliance - she refuses a GLP1 R agonist >> will increase insulin doses - discussed the need to lose weight. I suggested a referral to Weight loss clinic but she lives 1h away >> will look for local resources - I advised her to: Patient Instructions  Please change: - Basaglar 50 units in am and 35 units at night   Please increase: - Novolog dose: 28-32 units before meals  Please continue: - Novolog Sliding scale: - 150- 165: + 1 unit  - 166- 180: + 2 units  - 181- 195: + 3 units  - 196- 210: + 4 units  - >210: + 5 units   Please return in 3 months with your sugar log.    - today, HbA1c is 9.5% (higher)  - continue checking sugars at different times of the day - check 3x a day, rotating checks - advised for yearly eye exams >> she is due - she had the flu shot - Return to clinic in 3 mo with sugar log   2. PN - 2/2 DM - stable, no complaints today, but she does have occasional pains at night >> throughout the day  Philemon Kingdom, MD PhD [facility] Endocrinology

## 2016-11-16 NOTE — Patient Instructions (Addendum)
Please change: - Basaglar 50 units in am and 35 units at night   Please increase: - Novolog dose: 28-32 units before meals  Please continue: - Novolog Sliding scale: - 150- 165: + 1 unit  - 166- 180: + 2 units  - 181- 195: + 3 units  - 196- 210: + 4 units  - >210: + 5 units   Please return in 3 months with your sugar log.

## 2016-11-16 NOTE — Addendum Note (Signed)
Addended by: Caprice Beaver T on: 11/16/2016 10:38 AM   Modules accepted: Orders

## 2016-11-28 ENCOUNTER — Other Ambulatory Visit: Payer: Self-pay | Admitting: Family Medicine

## 2016-11-30 ENCOUNTER — Ambulatory Visit
Admission: RE | Admit: 2016-11-30 | Discharge: 2016-11-30 | Disposition: A | Discharge status: Home / Self Care | Payer: BLUE CROSS/BLUE SHIELD | Source: Ambulatory Visit | Attending: Family Medicine | Admitting: Family Medicine

## 2016-11-30 DIAGNOSIS — Z1231 Encounter for screening mammogram for malignant neoplasm of breast: Secondary | ICD-10-CM

## 2017-02-04 ENCOUNTER — Other Ambulatory Visit: Payer: Self-pay | Admitting: Family Medicine

## 2017-02-14 ENCOUNTER — Ambulatory Visit: Payer: BLUE CROSS/BLUE SHIELD | Admitting: Internal Medicine

## 2017-02-14 ENCOUNTER — Encounter: Payer: Self-pay | Admitting: Internal Medicine

## 2017-02-14 VITALS — BP 150/78 | HR 66 | Ht 71.0 in | Wt 309.2 lb

## 2017-02-14 DIAGNOSIS — N183 Chronic kidney disease, stage 3 (moderate): Secondary | ICD-10-CM | POA: Diagnosis not present

## 2017-02-14 DIAGNOSIS — IMO0002 Reserved for concepts with insufficient information to code with codable children: Secondary | ICD-10-CM

## 2017-02-14 DIAGNOSIS — E1122 Type 2 diabetes mellitus with diabetic chronic kidney disease: Secondary | ICD-10-CM | POA: Diagnosis not present

## 2017-02-14 DIAGNOSIS — E1165 Type 2 diabetes mellitus with hyperglycemia: Secondary | ICD-10-CM | POA: Diagnosis not present

## 2017-02-14 DIAGNOSIS — Z794 Long term (current) use of insulin: Secondary | ICD-10-CM | POA: Diagnosis not present

## 2017-02-14 DIAGNOSIS — E1142 Type 2 diabetes mellitus with diabetic polyneuropathy: Secondary | ICD-10-CM | POA: Diagnosis not present

## 2017-02-14 LAB — POCT GLYCOSYLATED HEMOGLOBIN (HGB A1C): Hemoglobin A1C: 9.1

## 2017-02-14 MED ORDER — INSULIN ASPART 100 UNIT/ML FLEXPEN: PEN_INJECTOR | SUBCUTANEOUS | 5 refills | Status: DC

## 2017-02-14 MED ORDER — BASAGLAR KWIKPEN 100 UNIT/ML ~~LOC~~ SOPN: PEN_INJECTOR | SUBCUTANEOUS | 5 refills | Status: DC

## 2017-02-14 NOTE — Addendum Note (Signed)
Addended by: Drucilla Schmidt on: 02/14/2017 10:19 AM   Modules accepted: Orders

## 2017-02-14 NOTE — Patient Instructions (Addendum)
Please change: - Basaglar 45 units in am and 30-35 units at night  - Novolog dose: 20-25 units before meals, and add 15-20 units before lunch - Novolog Sliding scale: - 150- 165: + 1 unit  - 166- 180: + 2 units  - 181- 195: + 3 units  - 196- 210: + 4 units  - >210: + 5 units   Please return in 3 months with your sugar log.

## 2017-02-14 NOTE — Progress Notes (Addendum)
Subjective:     Patient ID: Savannah Crawford, female   DOB: 14-Jan-1956, 61 y.o.   MRN: [id number]  HPI Savannah Crawford is a 61 y.o. woman returning for f/u for DM2, insulin-dependent, uncontrolled, with complications (CAD, CKD, diabetes neuropathy, diabetic retinopathy, PVD, amputated toe-R). Last visit 3 mo ago.  Last hemoglobin A1c was: Lab Results  Component Value Date   HGBA1C 9.5 11/16/2016   HGBA1C 8.1 05/23/2016   HGBA1C 9.2 (H) 01/19/2016   She is on: - Basaglar  (but using 40-45) units in am and 30-35 units at night  - Novolog dose: (using 20-25) units before or after meals. No insulin for lunch usually  Not using higher doses b/c mm cramps. - Novolog Sliding scale: - 150- 165: + 1 unit  - 166- 180: + 2 units  - 181- 195: + 3 units  - 196- 210: + 4 units  - >210: + 5 units  She was on metformin in the past, but stopped 2/2 CKD.  Tried Victoza before >> cannot remember SE.  She checks her sugars 1-2x a day: - am:  104-192, 234 >> 74, 120, 140-160 >> 98-114 - after b'fast: 128-183 >> n/c - prelunch: 115-170s >> 185 >> 170-180 >> n/c - predinner: 112-239 >> 200s if skips insulin >> 176-180s - bedtime: 148-404 >> 294 >> n/c  Lowest: 78 >> 70s (at night) x 2 >> 74 >> 98 Highest sugar 239 >> 280s >> 215.  Meter: Freestyle Lite  + CKD, sees nephrology; latest BUN/Cr: 24/1.49, GFR 43, Glu 198, Spot U prot./cr 231.34 (0-200) 11/08/2016:  Lab Results  Component Value Date   BUN 28 (H) 01/19/2016   CREATININE 1.80 (H) 01/19/2016  On Lisinopril 10.  + HL; Latest lipids: Lab Results  Component Value Date   CHOL 137 01/19/2016   HDL 48.30 01/19/2016   LDLCALC 70 01/19/2016   TRIG 94.0 01/19/2016   CHOLHDL 3 01/19/2016  On Lipitor 20.  Last eye exam: 02/2015 >> No DR.  She has numbness and tingling in feet - stable She sees podiatry  (Dr. Melony Overly) for L 3rd toe ulcer.  She also has a h/o nonobstructive CAD, with positive stress test but a negative cardiac cath 2012, and also has  diastolic heart failure. She has HTN, venous stasis, vitamin D deficiency.  Review of Systems Constitutional: no weight gain/no weight loss, no fatigue, no subjective hyperthermia, no subjective hypothermia Eyes: no blurry vision, no xerophthalmia ENT: no sore throat, no nodules palpated in throat, no dysphagia, no odynophagia, no hoarseness Cardiovascular: no CP/no SOB/no palpitations/no leg swelling Respiratory: no cough/no SOB/no wheezing Gastrointestinal: no N/no V/no D/no C/no acid reflux Musculoskeletal: no muscle aches/no joint aches Skin: no rashes, no hair loss Neurological: no tremors/+ numbness/+ tingling/no dizziness  I reviewed pt's medications, allergies, PMH, social hx, family hx, and changes were documented in the history of present illness. Otherwise, unchanged from my initial visit note.   Objective:   Physical Exam BP (!) 150/78   Pulse 66   Ht 5\' 11"  (1.803 m)   Wt (!) 309 lb 3.2 oz (140.3 kg)   SpO2 96%   BMI 43.12 kg/m  Body mass index is 43.12 kg/m. Wt Readings from Last 3 Encounters:  02/14/17 (!) 309 lb 3.2 oz (140.3 kg)  11/16/16 (!) 305 lb (138.3 kg)  05/31/16 (!) 303 lb 12.8 oz (137.8 kg)   Constitutional: overweight, in NAD Eyes: PERRLA, EOMI, no exophthalmos ENT: moist mucous membranes, no thyromegaly, no cervical lymphadenopathy  Cardiovascular: RRR, No MRG, + B pitting LE edema Respiratory: CTA B Gastrointestinal: abdomen soft, NT, ND, BS+ Musculoskeletal: no deformities, strength intact in all 4 Skin: moist, warm, no rashes Neurological: no tremor with outstretched hands, DTR normal in all 4   Assessment:     1. DM2, insulin-dependent, uncontrolled, with complications - nonobstructive CAD - CKD - diabetes neuropathy - diabetic retinopathy - PVD - R amputated toe  2. PN  3. Obesity class 3    Plan:     1. Pt with uncontrolled diabetes, on basal-bolus insulin regimen only, due to her CKD.  At last visit, we increase her Basaglar  doses in a.m. and decrease it at bedtime as she had mild low blood sugars at night.  I also advised her to take this consistently since she was missing doses approximately twice a week.  We also increase NovoLog with lunch as she had higher blood sugars before dinner.  We also discussed about adding a GLP-1 receptor agonist, but she refused. - since last visit, sugars are better in am but they increase throughout the day. She is not using the recommended amt of NovoLog as she mentions she develops mm cramps with the recommended doses. She also skips insulin completely before lunch unless she has a dessert., in which case, she takes ~10 units of novolog. Her sugars before dinner are 170-180s >> discussed the need to bolus for lunch also. - I advised her to: Patient Instructions  Please change: - Basaglar 45 units in am and 30-35 units at night  - Novolog dose: 20-25 units before meals, and add 15-20 units before lunch - Novolog Sliding scale: - 150- 165: + 1 unit  - 166- 180: + 2 units  - 181- 195: + 3 units  - 196- 210: + 4 units  - >210: + 5 units   Please return in 3 months with your sugar log.    - today, HbA1c is 9.1% (better) - continue checking sugars at different times of the day - check 3x a day, rotating checks - advised for yearly eye exams >> she is not UTD - Return to clinic in 3 mo with sugar log   2. PN - 2/2 DM2 - stable, no new complaints. She continues to have occas. Foot pain at night  3. Obesity: - At last visit, we again discussed about the need to lose weight. I suggested a referral to Weight loss clinic but she lives 1h away >> she wanted to look for local resources.  - Since last visit, she started to exercise. She gained 4 lbs during the Holidays. I encouraged her to continue exercise and we also discussed about working on her diet.  Philemon Kingdom, MD PhD [facility] Endocrinology

## 2017-03-07 ENCOUNTER — Other Ambulatory Visit: Payer: Self-pay | Admitting: Family Medicine

## 2017-03-08 NOTE — Telephone Encounter (Signed)
DENIED.  PT NEEDS AN APPOINTMENT FOR FURTHER REFILLS OF THIS MEDICATION.

## 2017-03-12 LAB — HM DIABETES EYE EXAM

## 2017-03-15 ENCOUNTER — Encounter: Payer: Self-pay | Admitting: Family Medicine

## 2017-03-18 NOTE — Progress Notes (Signed)
HPI:  Savannah Crawford is a pleasant 62 y.o. here for follow up. Chronic medical problems summarized below were reviewed for changes and stability.  History of very poor compliance, rarely follows up for regular checkups.  Due for labs and flu vaccine.  He does not take her blood pressure medications before she is coming to the doctor.  Did not take her medicines today.  Reports she takes them otherwise.  Needs refill on her lisinopril. Reports she is doing well.  Denies any concerns today.  She reports she had her flu shot in September or October. She reports she had it here, staff reports that is not the case.  Notified patient and she thinks it was here.  Declined doing it today as she is sure she had it.  Denies CP, SOB, DOE, treatment intolerance or new symptoms.  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  Past Medical History:  Diagnosis Date  . Anemia of chronic disease 11/17/2012  . At risk for heart disease - +stres test 2012 followed by [employer], [place] with neg cardiac cath 03/2010 (non-obstructive CAD) per review of records, echo 02/2010 normal LVF 01/15/2012  . Chronic kidney disease (CKD), stage IV (severe) ([place]) 01/09/2012  . Diabetes mellitus with renal manifestations, uncontrolled ([place])   . Diabetic neuropathy ([place]) 01/09/2012  . Diastolic heart failure - stage 1 per review of echo results from 02/2010 01/15/2012  . Hyperlipidemia   . Hypertension   . Obesity   . Peripheral arterial disease? Eval by vascular 2013 with no LE PAD and advised to stop pletal   . Severe obesity (BMI >= 40) ([place]) 11/13/2012  . Toe ulcer, right ([place])   .  Ulcer of toe of left foot ([place])    treated by [facility] per patient  . Venous stasis of lower extremity 03/10/2012    Past Surgical History:  Procedure Laterality Date  . EYE SURGERY    . TOE AMPUTATION  2008    Family History  Problem Relation Age of Onset  . Diabetes Mother   . Hyperlipidemia Mother   . Hypertension Mother   . Heart attack Mother   . Stroke Mother   . Diabetes Sister   . Hypertension Brother   . Diabetes Sister   . Diabetes Sister   . Breast cancer Sister   . Diabetes Sister   . Obesity Other   . Hypertension Other   . Hyperlipidemia Other   . Stroke Other   . Heart attack Other     Social History   Socioeconomic History  . Marital status: Married    Spouse name: None  . Number of children: None  . Years of education: None  . Highest education level: None  Social Needs  . Financial resource strain: None  . Food insecurity - worry: None  . Food insecurity - inability: None  . Transportation needs - medical: None  . Transportation needs - non-medical: None  Occupational History  . None  Tobacco Use  . Smoking status: Former Smoker    Types: Cigarettes    Last attempt to quit: 02/20/1999    Years since quitting: 18.0  . Smokeless tobacco: Never Used  Substance and Sexual Activity  .  Alcohol use: Yes  . Drug use: Yes  . Sexual activity: None  Other Topics Concern  . None  Social History Narrative  . None     Current Outpatient Medications:  .  atorvastatin (LIPITOR) 20 MG tablet, Take 1 tablet (20 mg total) by mouth daily., Disp: 90 tablet, Rfl: 3 .  B-D UF III MINI PEN NEEDLES 31G X 5 MM MISC, USE AS DIRECTED., Disp: 100 each, Rfl: 1 .  Blood Glucose Monitoring Suppl (FREESTYLE LITE) DEVI, Use to check sugar 2 times daily, Disp: 1 each, Rfl: 0 .  ferrous sulfate ([employer] IRON) 325 (65 FE) MG tablet, TAKE 1 TALBET BY MOUTH TWICE DAILY WITH A MEAL, Disp: 60 tablet, Rfl: 1 .  furosemide (LASIX) 40 MG tablet, Take 1 tablet (40 mg total) by  mouth 2 (two) times daily., Disp: 180 tablet, Rfl: 3 .  glucose blood (FREESTYLE LITE) test strip, Use as instructed to check the sugar 2 times daily, Disp: 200 each, Rfl: 5 .  insulin aspart (NOVOLOG FLEXPEN) 100 UNIT/ML FlexPen, INJECT UP TO 90 UNITS DAILY AS INSTRUCTED., Disp: 10 pen, Rfl: 5 .  Insulin Glargine (BASAGLAR KWIKPEN) 100 UNIT/ML SOPN, Inject up to 90 units a day as advised, Disp: 10 pen, Rfl: 5 .  Insulin Pen Needle (B-D ULTRAFINE III SHORT PEN) 31G X 8 MM MISC, Use as directed., Disp: 100 each, Rfl: 5 .  metoprolol succinate (TOPROL-XL) 25 MG 24 hr tablet, Take 1 tablet (25 mg total) by mouth daily., Disp: 90 tablet, Rfl: 3 .  lisinopril (PRINIVIL,ZESTRIL) 10 MG tablet, Take 1 tablet (10 mg total) by mouth daily., Disp: 90 tablet, Rfl: 3  EXAM:  Vitals:   03/21/17 0943  BP: (!) 142/78  Pulse: 67  Temp: 98 F (36.7 C)    Body mass index is 42.78 kg/m.  GENERAL: vitals reviewed and listed above, alert, oriented, appears well hydrated and in no acute distress  HEENT: atraumatic, conjunttiva clear, no obvious abnormalities on inspection of external nose and ears  NECK: no obvious masses on inspection  LUNGS: clear to auscultation bilaterally, no wheezes, rales or rhonchi, good air movement  CV: HRRR, no peripheral edema  MS: moves all extremities without noticeable abnormality  PSYCH: pleasant and cooperative, no obvious depression or anxiety  ASSESSMENT AND PLAN:  Discussed the following assessment and plan:  Hypertension associated with diabetes ([place]) - Plan: Basic metabolic panel, CBC  Hyperlipidemia associated with type 2 diabetes mellitus ([place]) - Plan: Lipid panel  Severe obesity (BMI >= 40) (H[facility])  Uncontrolled type 2 diabetes mellitus with stage 3 chronic kidney disease, with long-term current use of insulin (H[facility])  Anemia of chronic disease  Chronic diastolic heart failure (H[facility]), Chronic  Chronic kidney disease (CKD), stage IV (severe) ([place]),  Chronic  -labs -lifestyle recs -refills  -advised to take meds daily and prior to appts -follow up 3-4 months -Patient advised to return or notify a doctor immediately if symptoms worsen or persist or new concerns arise.  Patient Instructions  BEFORE YOU LEAVE: -labs -follow up: 3-4 months  I sent 10mg  tablets of the lisinopril to the pharmacy. Take all blood pressure medications daily and take prior to doctor visits.  We have ordered labs or studies at this visit. It can take up to 1-2 weeks for results and processing. IF results require follow up or explanation, we will call you with instructions. Clinically stable results will be released to your [facility]. If you have not heard from [place] or  cannot find your results in [facility] in 2 weeks please contact our office at [phone].  If you are not yet signed up for [facility], please consider signing up.   We recommend the following healthy lifestyle for LIFE: 1) Small portions. But, make sure to get regular (at least 3 per day), healthy meals and small healthy snacks if needed.  2) Eat a healthy clean diet.   TRY TO EAT: -at least 5-7 servings of low sugar, colorful, and nutrient rich vegetables per day (not corn, potatoes or bananas.) -berries are the best choice if you wish to eat fruit (only eat small amounts if trying to reduce weight)  -lean meets (fish, white meat of chicken or [place]) -vegan proteins for some meals - beans or tofu, whole grains, nuts and seeds -Replace bad fats with good fats - good fats include: fish, nuts and seeds, canola oil, olive oil -small amounts of low fat or non fat dairy -small amounts of100 % whole grains - check the lables -drink plenty of water  AVOID: -SUGAR, sweets, anything with added sugar, corn syrup or sweeteners - must read labels as even foods advertised as "healthy" often are loaded with sugar -if you must have a sweetener, small amounts of stevia may be best -sweetened beverages and  artificially sweetened beverages -simple starches (rice, bread, potatoes, pasta, chips, etc - small amounts of 100% whole grains are ok) -red meat, pork, butter -fried foods, fast food, processed food, excessive dairy, eggs and coconut.  3)Get at least 150 minutes of sweaty aerobic exercise per week.  4)Reduce stress - consider counseling, meditation and relaxation to balance other aspects of your life.          Lucretia Kern, DO

## 2017-03-21 ENCOUNTER — Encounter: Payer: Self-pay | Admitting: Family Medicine

## 2017-03-21 ENCOUNTER — Ambulatory Visit: Payer: BLUE CROSS/BLUE SHIELD | Admitting: Family Medicine

## 2017-03-21 VITALS — BP 142/78 | HR 67 | Temp 98.0°F | Ht 71.0 in | Wt 306.7 lb

## 2017-03-21 DIAGNOSIS — E1159 Type 2 diabetes mellitus with other circulatory complications: Secondary | ICD-10-CM | POA: Diagnosis not present

## 2017-03-21 DIAGNOSIS — E1169 Type 2 diabetes mellitus with other specified complication: Secondary | ICD-10-CM

## 2017-03-21 DIAGNOSIS — N184 Chronic kidney disease, stage 4 (severe): Secondary | ICD-10-CM

## 2017-03-21 DIAGNOSIS — IMO0002 Reserved for concepts with insufficient information to code with codable children: Secondary | ICD-10-CM

## 2017-03-21 DIAGNOSIS — N183 Chronic kidney disease, stage 3 (moderate): Secondary | ICD-10-CM | POA: Diagnosis not present

## 2017-03-21 DIAGNOSIS — I10 Essential (primary) hypertension: Secondary | ICD-10-CM | POA: Diagnosis not present

## 2017-03-21 DIAGNOSIS — E1122 Type 2 diabetes mellitus with diabetic chronic kidney disease: Secondary | ICD-10-CM | POA: Diagnosis not present

## 2017-03-21 DIAGNOSIS — Z794 Long term (current) use of insulin: Secondary | ICD-10-CM | POA: Diagnosis not present

## 2017-03-21 DIAGNOSIS — E1165 Type 2 diabetes mellitus with hyperglycemia: Secondary | ICD-10-CM | POA: Diagnosis not present

## 2017-03-21 DIAGNOSIS — D638 Anemia in other chronic diseases classified elsewhere: Secondary | ICD-10-CM

## 2017-03-21 DIAGNOSIS — E785 Hyperlipidemia, unspecified: Secondary | ICD-10-CM

## 2017-03-21 DIAGNOSIS — I5032 Chronic diastolic (congestive) heart failure: Secondary | ICD-10-CM | POA: Diagnosis not present

## 2017-03-21 LAB — BASIC METABOLIC PANEL
BUN: 22 mg/dL (ref 6–23)
CO2: 30 meq/L (ref 19–32)
CREATININE: 1.57 mg/dL — AB (ref 0.40–1.20)
Calcium: 8.8 mg/dL (ref 8.4–10.5)
Chloride: 102 mEq/L (ref 96–112)
GFR: 43.03 mL/min — ABNORMAL LOW (ref >60.00)
GLUCOSE: 125 mg/dL — AB (ref 70–99)
Potassium: 4.7 mEq/L (ref 3.5–5.1)
Sodium: 137 mEq/L (ref 135–145)

## 2017-03-21 LAB — LIPID PANEL
CHOL/HDL RATIO: 3
CHOLESTEROL: 122 mg/dL (ref 0–200)
HDL: 43.2 mg/dL (ref >39.00)
LDL Cholesterol: 61 mg/dL (ref 0–99)
NonHDL: 79.29
Triglycerides: 90 mg/dL (ref 0.0–149.0)
VLDL: 18 mg/dL (ref 0.0–40.0)

## 2017-03-21 LAB — CBC
HCT: 35.9 % — ABNORMAL LOW (ref 36.0–46.0)
Hemoglobin: 11.7 g/dL — ABNORMAL LOW (ref 12.0–15.0)
MCHC: 32.5 g/dL (ref 30.0–36.0)
MCV: 83.9 fl (ref 78.0–100.0)
Platelets: 279 10*3/uL (ref 150.0–400.0)
RBC: 4.28 Mil/uL (ref 3.87–5.11)
RDW: 14.4 % (ref 11.5–15.5)
WBC: 7.6 10*3/uL (ref 4.0–10.5)

## 2017-03-21 MED ORDER — LISINOPRIL 10 MG PO TABS: 10.0000 mg | ORAL_TABLET | Freq: Every day | ORAL | 3 refills | Status: DC

## 2017-03-21 MED ORDER — FUROSEMIDE 40 MG PO TABS: 40.0000 mg | ORAL_TABLET | Freq: Two times a day (BID) | ORAL | 3 refills | Status: DC

## 2017-03-21 MED ORDER — METOPROLOL SUCCINATE ER 25 MG PO TB24: 25.0000 mg | ORAL_TABLET | Freq: Every day | ORAL | 3 refills | Status: DC

## 2017-03-21 MED ORDER — ATORVASTATIN CALCIUM 20 MG PO TABS: 20.0000 mg | ORAL_TABLET | Freq: Every day | ORAL | 3 refills | Status: DC

## 2017-03-21 NOTE — Patient Instructions (Addendum)
BEFORE YOU LEAVE: -labs -follow up: 3-4 months  I sent 10mg  tablets of the lisinopril to the pharmacy. Take all blood pressure medications daily and take prior to doctor visits.  We have ordered labs or studies at this visit. It can take up to 1-2 weeks for results and processing. IF results require follow up or explanation, we will call you with instructions. Clinically stable results will be released to your [facility]. If you have not heard from [place] or cannot find your results in [facility] in 2 weeks please contact our office at [phone].  If you are not yet signed up for [facility], please consider signing up.   We recommend the following healthy lifestyle for LIFE: 1) Small portions. But, make sure to get regular (at least 3 per day), healthy meals and small healthy snacks if needed.  2) Eat a healthy clean diet.   TRY TO EAT: -at least 5-7 servings of low sugar, colorful, and nutrient rich vegetables per day (not corn, potatoes or bananas.) -berries are the best choice if you wish to eat fruit (only eat small amounts if trying to reduce weight)  -lean meets (fish, white meat of chicken or [place]) -vegan proteins for some meals - beans or tofu, whole grains, nuts and seeds -Replace bad fats with good fats - good fats include: fish, nuts and seeds, canola oil, olive oil -small amounts of low fat or non fat dairy -small amounts of100 % whole grains - check the lables -drink plenty of water  AVOID: -SUGAR, sweets, anything with added sugar, corn syrup or sweeteners - must read labels as even foods advertised as "healthy" often are loaded with sugar -if you must have a sweetener, small amounts of stevia may be best -sweetened beverages and artificially sweetened beverages -simple starches (rice, bread, potatoes, pasta, chips, etc - small amounts of 100% whole grains are ok) -red meat, pork, butter -fried foods, fast food, processed food, excessive dairy, eggs and coconut.  3)Get at least  150 minutes of sweaty aerobic exercise per week.  4)Reduce stress - consider counseling, meditation and relaxation to balance other aspects of your life.

## 2017-05-20 ENCOUNTER — Ambulatory Visit: Payer: BLUE CROSS/BLUE SHIELD | Admitting: Internal Medicine

## 2017-07-16 NOTE — Progress Notes (Signed)
HPI:  Using dictation device. Unfortunately this device frequently misinterprets words/phrases.  Savannah Crawford is a pleasant 62 y.o. here for follow up. Chronic medical problems summarized below were reviewed for changes. Her only new reported health concern today is a skin lesion on the R lower ant/med leg. Reports ongoing for 1 year and crusty or scaly at times. She tried antibiotic ointment on it which did not help. No reported lesions elsewhere, bleeding or pain. Exercising 2x per week - aerobic. Reports saw her nephrologist recently. Reports scheduling endo follow up. Reports FBS in 90s on average. Wants me to check her diabetes lab today so available at her endo appointment. Denies CP, SOB, DOE, treatment intolerance or new symptoms. 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  Past Medical History:  Diagnosis Date  . Anemia of chronic disease 11/17/2012  . At risk for heart disease - +stres test 2012 followed by [facility], [place] with neg cardiac cath 03/2010 (non-obstructive CAD) per review of records, echo 02/2010 normal LVF 01/15/2012  . Chronic kidney disease (CKD), stage IV (severe) ([place]) 01/09/2012  . Diabetes mellitus with renal manifestations, uncontrolled ([place])   . Diabetic neuropathy ([place]) 01/09/2012  . Diastolic heart failure - stage 1 per review of echo results from 02/2010 01/15/2012  . Hyperlipidemia   . Hypertension   . Obesity   . Peripheral arterial disease? Eval by vascular 2013 with no LE PAD and advised to stop pletal   . Severe obesity (BMI >= 40) ([place]) 11/13/2012  . Toe ulcer, right ([place])   . Ulcer of  toe of left foot ([place])    treated by [facility] per patient  . Venous stasis of lower extremity 03/10/2012    Past Surgical History:  Procedure Laterality Date  . EYE SURGERY    . TOE AMPUTATION  2008    Family History  Problem Relation Age of Onset  . Diabetes Mother   . Hyperlipidemia Mother   . Hypertension Mother   . Heart attack Mother   . Stroke Mother   . Diabetes Sister   . Hypertension Brother   . Diabetes Sister   . Diabetes Sister   . Breast cancer Sister   . Diabetes Sister   . Obesity Other   . Hypertension Other   . Hyperlipidemia Other   . Stroke Other   . Heart attack Other     SOCIAL HX: see hpi   Current Outpatient Medications:  .  atorvastatin (LIPITOR) 20 MG tablet, Take 1 tablet (20 mg total) by mouth daily., Disp: 90 tablet, Rfl: 3 .  B-D UF III MINI PEN NEEDLES 31G X 5 MM MISC, USE AS DIRECTED., Disp: 100 each, Rfl: 1 .  Blood Glucose Monitoring Suppl (FREESTYLE LITE) DEVI, Use to check sugar 2 times daily, Disp: 1 each, Rfl: 0 .  ferrous sulfate (CVS IRON) 325 (65 FE) MG tablet, TAKE 1 TALBET BY MOUTH TWICE DAILY WITH A MEAL, Disp: 60 tablet, Rfl: 1 .  furosemide (LASIX) 40 MG tablet, Take 1 tablet (40 mg total) by mouth 2 (two) times daily., Disp: 180 tablet, Rfl: 3 .  glucose blood (FREESTYLE LITE) test strip, Use as instructed to check  the sugar 2 times daily, Disp: 200 each, Rfl: 5 .  insulin aspart (NOVOLOG FLEXPEN) 100 UNIT/ML FlexPen, INJECT UP TO 90 UNITS DAILY AS INSTRUCTED., Disp: 10 pen, Rfl: 5 .  Insulin Glargine (BASAGLAR KWIKPEN) 100 UNIT/ML SOPN, Inject up to 90 units a day as advised, Disp: 10 pen, Rfl: 5 .  Insulin Pen Needle (B-D ULTRAFINE III SHORT PEN) 31G X 8 MM MISC, Use as directed., Disp: 100 each, Rfl: 5 .  lisinopril (PRINIVIL,ZESTRIL) 10 MG tablet, Take 1 tablet (10 mg total) by mouth daily., Disp: 90 tablet, Rfl: 3 .  metoprolol succinate (TOPROL-XL) 25 MG 24 hr tablet, Take 1 tablet (25 mg total) by mouth daily., Disp:  90 tablet, Rfl: 3  EXAM:  Vitals:   07/18/17 0853  BP: 100/78  Pulse: 66  Temp: 98 F (36.7 C)    Body mass index is 43.47 kg/m.  GENERAL: vitals reviewed and listed above, alert, oriented, appears well hydrated and in no acute distress  HEENT: atraumatic, conjunttiva clear, no obvious abnormalities on inspection of external nose and ears  NECK: no obvious masses on inspection  LUNGS: clear to auscultation bilaterally, no wheezes, rales or rhonchi, good air movement  CV: HRRR, no peripheral edema  MS: moves all extremities without noticeable abnormality  SKIN: 13x24mm sl thickened scaly hyperpigmented patch of skin on R lower anteromedial leg  PSYCH: pleasant and cooperative, no obvious depression or anxiety  ASSESSMENT AND PLAN:  Discussed the following assessment and plan:  Skin lesion - Plan: Ambulatory referral to Dermatology -we discussed possible serious and likely etiologies, workup and treatment, treatment risks and return precautions and I advised dermatology eval, antifungal in interim -after this discussion, Laneice opted for dermatology eval - referral placed -of course, we advised Hensley  to return or notify a doctor immediately if symptoms worsen or persist or new concerns arise.  Uncontrolled type 2 diabetes mellitus with stage 3 chronic kidney disease, with long-term current use of insulin ([place]) - Plan: Hemoglobin A1c -labs -lifestyle recs  Hypertension associated with diabetes ([place]) - Plan: Basic metabolic panel, CBC -cont meds -lifestyle recs -labs  Hyperlipidemia associated with type 2 diabetes mellitus ([place]) - Plan: Lipid panel -labs -cont statin, lifestyle recs  Diabetic polyneuropathy associated with type 2 diabetes mellitus ([place]) -sees podiatry  Chronic kidney disease (CKD), stage IV (severe) ([place]) -sees nephology  Anemia of chronic disease -sees nephrology  Severe obesity (BMI >= 40) ([place]) -advised wt reduction, healthy diet,  regular exercise  -Patient advised to return or notify a doctor immediately if symptoms worsen or persist or new concerns arise.  Patient Instructions  BEFORE YOU LEAVE: -labs -follow up: 3-4 months  -We placed a referral for you as discussed to the dermatologist. It usually takes about 1 week to process and schedule this referral. If you have not heard from [place] regarding this appointment in 1 week please contact our office.  -can try topical lotrimin cream on the skin lesion 1-2 times daily for the next 1 month  We have ordered labs or studies at this visit. It can take up to 1-2 weeks for results and processing. IF results require follow up or explanation, we will call you with instructions. Clinically stable results will be released to your [facility]. If you have not heard from [place] or cannot find your results in [facility] in 2 weeks please contact our office at [phone].  If you are not yet signed up for [facility], please consider signing up.   We  recommend the following healthy lifestyle for LIFE: 1) Small portions. But, make sure to get regular (at least 3 per day), healthy meals and small healthy snacks if needed.  2) Eat a healthy clean diet.   TRY TO EAT: -at least 5-7 servings of low sugar, colorful, and nutrient rich vegetables per day (not corn, potatoes or bananas.) -berries are the best choice if you wish to eat fruit (only eat small amounts if trying to reduce weight)  -lean meets (fish, white meat of chicken or [place]) -vegan proteins for some meals - beans or tofu, whole grains, nuts and seeds -Replace bad fats with good fats - good fats include: fish, nuts and seeds, canola oil, olive oil -small amounts of low fat or non fat dairy -small amounts of100 % whole grains - check the lables -drink plenty of water  AVOID: -SUGAR, sweets, anything with added sugar, corn syrup or sweeteners - must read labels as even foods advertised as "healthy" often are loaded with sugar -if  you must have a sweetener, small amounts of stevia may be best -sweetened beverages and artificially sweetened beverages -simple starches (rice, bread, potatoes, pasta, chips, etc - small amounts of 100% whole grains are ok) -red meat, pork, butter -fried foods, fast food, processed food, excessive dairy, eggs and coconut.  3)Get at least 150 minutes of sweaty aerobic exercise per week.  4)Reduce stress - consider counseling, meditation and relaxation to balance other aspects of your life.             Lucretia Kern, DO

## 2017-07-18 ENCOUNTER — Ambulatory Visit: Payer: BLUE CROSS/BLUE SHIELD | Admitting: Family Medicine

## 2017-07-18 ENCOUNTER — Encounter: Payer: Self-pay | Admitting: Family Medicine

## 2017-07-18 VITALS — BP 100/78 | HR 66 | Temp 98.0°F | Ht 71.0 in | Wt 311.7 lb

## 2017-07-18 DIAGNOSIS — Z794 Long term (current) use of insulin: Secondary | ICD-10-CM | POA: Diagnosis not present

## 2017-07-18 DIAGNOSIS — IMO0002 Reserved for concepts with insufficient information to code with codable children: Secondary | ICD-10-CM

## 2017-07-18 DIAGNOSIS — I152 Hypertension secondary to endocrine disorders: Secondary | ICD-10-CM

## 2017-07-18 DIAGNOSIS — E1165 Type 2 diabetes mellitus with hyperglycemia: Secondary | ICD-10-CM

## 2017-07-18 DIAGNOSIS — D638 Anemia in other chronic diseases classified elsewhere: Secondary | ICD-10-CM | POA: Diagnosis not present

## 2017-07-18 DIAGNOSIS — E1122 Type 2 diabetes mellitus with diabetic chronic kidney disease: Secondary | ICD-10-CM

## 2017-07-18 DIAGNOSIS — E1142 Type 2 diabetes mellitus with diabetic polyneuropathy: Secondary | ICD-10-CM | POA: Diagnosis not present

## 2017-07-18 DIAGNOSIS — L989 Disorder of the skin and subcutaneous tissue, unspecified: Secondary | ICD-10-CM

## 2017-07-18 DIAGNOSIS — E1169 Type 2 diabetes mellitus with other specified complication: Secondary | ICD-10-CM

## 2017-07-18 DIAGNOSIS — E785 Hyperlipidemia, unspecified: Secondary | ICD-10-CM | POA: Diagnosis not present

## 2017-07-18 DIAGNOSIS — E1159 Type 2 diabetes mellitus with other circulatory complications: Secondary | ICD-10-CM | POA: Diagnosis not present

## 2017-07-18 DIAGNOSIS — N184 Chronic kidney disease, stage 4 (severe): Secondary | ICD-10-CM

## 2017-07-18 DIAGNOSIS — N183 Chronic kidney disease, stage 3 (moderate): Secondary | ICD-10-CM | POA: Diagnosis not present

## 2017-07-18 DIAGNOSIS — I10 Essential (primary) hypertension: Secondary | ICD-10-CM

## 2017-07-18 LAB — CBC
HEMATOCRIT: 36.3 % (ref 36.0–46.0)
HEMOGLOBIN: 11.6 g/dL — AB (ref 12.0–15.0)
MCHC: 32 g/dL (ref 30.0–36.0)
MCV: 85.4 fl (ref 78.0–100.0)
Platelets: 266 10*3/uL (ref 150.0–400.0)
RBC: 4.25 Mil/uL (ref 3.87–5.11)
RDW: 14.6 % (ref 11.5–15.5)
WBC: 7 10*3/uL (ref 4.0–10.5)

## 2017-07-18 LAB — BASIC METABOLIC PANEL
BUN: 21 mg/dL (ref 6–23)
CHLORIDE: 104 meq/L (ref 96–112)
CO2: 25 mEq/L (ref 19–32)
Calcium: 9 mg/dL (ref 8.4–10.5)
Creatinine, Ser: 1.51 mg/dL — ABNORMAL HIGH (ref 0.40–1.20)
GFR: 44.96 mL/min — AB (ref >60.00)
GLUCOSE: 208 mg/dL — AB (ref 70–99)
POTASSIUM: 4.6 meq/L (ref 3.5–5.1)
Sodium: 136 mEq/L (ref 135–145)

## 2017-07-18 LAB — LIPID PANEL
CHOL/HDL RATIO: 3
Cholesterol: 130 mg/dL (ref 0–200)
HDL: 51.1 mg/dL (ref >39.00)
LDL Cholesterol: 64 mg/dL (ref 0–99)
NONHDL: 79
Triglycerides: 74 mg/dL (ref 0.0–149.0)
VLDL: 14.8 mg/dL (ref 0.0–40.0)

## 2017-07-18 LAB — HEMOGLOBIN A1C: Hgb A1c MFr Bld: 9.7 % — ABNORMAL HIGH (ref 4.6–6.5)

## 2017-07-18 NOTE — Patient Instructions (Signed)
BEFORE YOU LEAVE: -labs -follow up: 3-4 months  -We placed a referral for you as discussed to the dermatologist. It usually takes about 1 week to process and schedule this referral. If you have not heard from [place] regarding this appointment in 1 week please contact our office.  -can try topical lotrimin cream on the skin lesion 1-2 times daily for the next 1 month  We have ordered labs or studies at this visit. It can take up to 1-2 weeks for results and processing. IF results require follow up or explanation, we will call you with instructions. Clinically stable results will be released to your [facility]. If you have not heard from [place] or cannot find your results in [facility] in 2 weeks please contact our office at [phone].  If you are not yet signed up for [facility], please consider signing up.   We recommend the following healthy lifestyle for LIFE: 1) Small portions. But, make sure to get regular (at least 3 per day), healthy meals and small healthy snacks if needed.  2) Eat a healthy clean diet.   TRY TO EAT: -at least 5-7 servings of low sugar, colorful, and nutrient rich vegetables per day (not corn, potatoes or bananas.) -berries are the best choice if you wish to eat fruit (only eat small amounts if trying to reduce weight)  -lean meets (fish, white meat of chicken or [place]) -vegan proteins for some meals - beans or tofu, whole grains, nuts and seeds -Replace bad fats with good fats - good fats include: fish, nuts and seeds, canola oil, olive oil -small amounts of low fat or non fat dairy -small amounts of100 % whole grains - check the lables -drink plenty of water  AVOID: -SUGAR, sweets, anything with added sugar, corn syrup or sweeteners - must read labels as even foods advertised as "healthy" often are loaded with sugar -if you must have a sweetener, small amounts of stevia may be best -sweetened beverages and artificially sweetened beverages -simple starches (rice, bread,  potatoes, pasta, chips, etc - small amounts of 100% whole grains are ok) -red meat, pork, butter -fried foods, fast food, processed food, excessive dairy, eggs and coconut.  3)Get at least 150 minutes of sweaty aerobic exercise per week.  4)Reduce stress - consider counseling, meditation and relaxation to balance other aspects of your life.

## 2017-10-05 ENCOUNTER — Other Ambulatory Visit: Payer: Self-pay | Admitting: Internal Medicine

## 2017-11-12 NOTE — Progress Notes (Signed)
HPI:  Using dictation device. Unfortunately this device frequently misinterprets words/phrases.  Savannah Crawford is a pleasant 62 y.o. here for follow up. Chronic medical problems summarized below were reviewed for changes and stability and were updated as needed below. These issues and their treatment remain stable for the most part.  Does not have any complaints today.  She reports she gets exercise 1 to 2 days/week.  Diet is so-so. Unfortunately, she decided to stop her Lasix on her own.  This was because of dry skin.  She does have some swelling in her ankles.  She does not think this is any worse than in the past. Denies CP, SOB, DOE, treatment intolerance or new symptoms. Due for labs and flu shot.  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  Past Medical History:  Diagnosis Date  . Anemia of chronic disease 11/17/2012  . At risk for heart disease - +stres test 2012 followed by [facility], [place] with neg cardiac cath 03/2010 (non-obstructive CAD) per review of records, echo 02/2010 normal LVF 01/15/2012  . Chronic kidney disease (CKD), stage IV (severe) ([place]) 01/09/2012  . Diabetes mellitus with renal manifestations, uncontrolled ([place])   . Diabetic neuropathy ([place]) 01/09/2012  . Diastolic heart failure - stage 1 per review of echo results from 02/2010 01/15/2012  . Hyperlipidemia   . Hypertension   . Obesity   . Peripheral arterial disease? Eval by vascular 2013 with no LE PAD and advised to stop pletal   . Severe obesity (BMI >= 40) ([place]) 11/13/2012  . Toe ulcer, right ([place])   . Ulcer of toe of left foot ([place])    treated by [facility]  per patient  . Venous stasis of lower extremity 03/10/2012    Past Surgical History:  Procedure Laterality Date  . EYE SURGERY    . TOE AMPUTATION  2008    Family History  Problem Relation Age of Onset  . Diabetes Mother   . Hyperlipidemia Mother   . Hypertension Mother   . Heart attack Mother   . Stroke Mother   . Diabetes Sister   . Hypertension Brother   . Diabetes Sister   . Diabetes Sister   . Breast cancer Sister   . Diabetes Sister   . Obesity Other   . Hypertension Other   . Hyperlipidemia Other   . Stroke Other   . Heart attack Other     SOCIAL HX: See HPI   Current Outpatient Medications:  .  atorvastatin (LIPITOR) 20 MG tablet, Take 1 tablet (20 mg total) by mouth daily., Disp: 90 tablet, Rfl: 3 .  B-D UF III MINI PEN NEEDLES 31G X 5 MM MISC, USE AS DIRECTED., Disp: 100 each, Rfl: 1 .  Blood Glucose Monitoring Suppl (FREESTYLE LITE) DEVI, Use to check sugar 2 times daily, Disp: 1 each, Rfl: 0 .  ferrous sulfate (CVS IRON) 325 (65 FE) MG tablet, TAKE 1 TALBET BY MOUTH TWICE DAILY WITH A MEAL, Disp: 60 tablet, Rfl: 1 .  furosemide (LASIX) 40 MG tablet, Take 1 tablet (40 mg total) by mouth 2 (two) times daily., Disp: 180 tablet, Rfl: 3 .  glucose blood (FREESTYLE LITE) test strip, Use as instructed to check  the sugar 2 times daily, Disp: 200 each, Rfl: 5 .  insulin aspart (NOVOLOG FLEXPEN) 100 UNIT/ML FlexPen, INJECT UP TO 90 UNITS DAILY AS INSTRUCTED., Disp: 10 pen, Rfl: 5 .  Insulin Glargine (BASAGLAR KWIKPEN) 100 UNIT/ML SOPN, INJECT UP TO 90 UNITS A DAY AS ADVISED, Disp: 90 pen, Rfl: 1 .  Insulin Pen Needle (B-D ULTRAFINE III SHORT PEN) 31G X 8 MM MISC, Use as directed., Disp: 100 each, Rfl: 5 .  lisinopril (PRINIVIL,ZESTRIL) 10 MG tablet, Take 1 tablet (10 mg total) by mouth daily., Disp: 90 tablet, Rfl: 3 .  metoprolol succinate (TOPROL-XL) 25 MG 24 hr tablet, Take 1 tablet (25 mg total) by mouth daily., Disp: 90 tablet, Rfl: 3  EXAM:  Vitals:   11/18/17  0851  BP: 120/68  Pulse: 68  Temp: 98.3 F (36.8 C)    Body mass index is 42.16 kg/m.  GENERAL: vitals reviewed and listed above, alert, oriented, appears well hydrated and in no acute distress  HEENT: atraumatic, conjunttiva clear, no obvious abnormalities on inspection of external nose and ears  NECK: no obvious masses on inspection  LUNGS: clear to auscultation bilaterally, no wheezes, rales or rhonchi, good air movement  CV: HRRR, bilateral lower extremity edema to mid calf MS: moves all extremities without noticeable abnormality  PSYCH: pleasant and cooperative, no obvious depression or anxiety  ASSESSMENT AND PLAN:  Discussed the following assessment and plan:  Chronic diastolic heart failure (HCC)  Uncontrolled type 2 diabetes mellitus with stage 3 chronic kidney disease, with long-term current use of insulin (HCC) - Plan: Hemoglobin A1c  Chronic kidney disease (CKD), stage IV (severe) (HCC)  Severe obesity (BMI >= 40) (HCC)  Hypertension associated with diabetes ([place]) - Plan: Basic metabolic panel, CBC  Hyperlipidemia associated with type 2 diabetes mellitus (HCC)  Anemia of chronic disease  Need for immunization against influenza - Plan: Flu Vaccine QUAD 6+ mos PF IM (Fluarix Quad PF)  -Recommended regular exercise and a healthy diet -Labs per orders, will forward to specialist as needed -Recommended that she restart her Lasix, started a half dose and gradually build up -Also recommended compression socks -Recommended she can use Aquaphor on her skin to control the dry skin -Flu shot today -Follow-up 3 months -Patient advised to return or notify a doctor immediately if symptoms worsen or persist or new concerns arise.  Patient Instructions  BEFORE YOU LEAVE: -PHQ9 in epic -flu shot -labs -follow up: 3 months  CALL your endocrinologist for follow up.  RESTART your lasix and take every day.  Use compression socks daily.  We have ordered labs or  studies at this visit. It can take up to 1-2 weeks for results and processing. IF results require follow up or explanation, we will call you with instructions. Clinically stable results will be released to your [facility]. If you have not heard from [place] or cannot find your results in [facility] in 2 weeks please contact our office at [phone].  If you are not yet signed up for [facility], please consider signing up.    We recommend the following healthy lifestyle for LIFE: 1) Small portions. But, make sure to get regular (at least 3 per day), healthy meals and small healthy snacks if needed.  2) Eat a healthy clean diet.   TRY TO EAT: -at least 5-7 servings of low sugar, colorful, and nutrient rich vegetables per day (not corn, potatoes or bananas.) -berries are the best choice if you wish to eat fruit (only eat  small amounts if trying to reduce weight)  -lean meets (fish, white meat of chicken or [place]) -vegan proteins for some meals - beans or tofu, whole grains, nuts and seeds -Replace bad fats with good fats - good fats include: fish, nuts and seeds, canola oil, olive oil -small amounts of low fat or non fat dairy -small amounts of100 % whole grains - check the lables -drink plenty of water  AVOID: -SUGAR, sweets, anything with added sugar, corn syrup or sweeteners - must read labels as even foods advertised as "healthy" often are loaded with sugar -if you must have a sweetener, small amounts of stevia may be best -sweetened beverages and artificially sweetened beverages -simple starches (rice, bread, potatoes, pasta, chips, etc - small amounts of 100% whole grains are ok) -red meat, pork, butter -fried foods, fast food, processed food, excessive dairy, eggs and coconut.  3)Get at least 150 minutes of sweaty aerobic exercise per week.  4)Reduce stress - consider counseling, meditation and relaxation to balance other aspects of your life.      Lucretia Kern, DO

## 2017-11-18 ENCOUNTER — Encounter: Payer: Self-pay | Admitting: Family Medicine

## 2017-11-18 ENCOUNTER — Ambulatory Visit: Payer: BLUE CROSS/BLUE SHIELD | Admitting: Family Medicine

## 2017-11-18 VITALS — BP 120/68 | HR 68 | Temp 98.3°F | Ht 71.0 in | Wt 302.3 lb

## 2017-11-18 DIAGNOSIS — E1159 Type 2 diabetes mellitus with other circulatory complications: Secondary | ICD-10-CM

## 2017-11-18 DIAGNOSIS — Z23 Encounter for immunization: Secondary | ICD-10-CM

## 2017-11-18 DIAGNOSIS — I5032 Chronic diastolic (congestive) heart failure: Secondary | ICD-10-CM

## 2017-11-18 DIAGNOSIS — N184 Chronic kidney disease, stage 4 (severe): Secondary | ICD-10-CM | POA: Diagnosis not present

## 2017-11-18 DIAGNOSIS — N183 Chronic kidney disease, stage 3 (moderate): Secondary | ICD-10-CM | POA: Diagnosis not present

## 2017-11-18 DIAGNOSIS — E785 Hyperlipidemia, unspecified: Secondary | ICD-10-CM

## 2017-11-18 DIAGNOSIS — D638 Anemia in other chronic diseases classified elsewhere: Secondary | ICD-10-CM

## 2017-11-18 DIAGNOSIS — I10 Essential (primary) hypertension: Secondary | ICD-10-CM

## 2017-11-18 DIAGNOSIS — E1122 Type 2 diabetes mellitus with diabetic chronic kidney disease: Secondary | ICD-10-CM

## 2017-11-18 DIAGNOSIS — Z794 Long term (current) use of insulin: Secondary | ICD-10-CM | POA: Diagnosis not present

## 2017-11-18 DIAGNOSIS — E1169 Type 2 diabetes mellitus with other specified complication: Secondary | ICD-10-CM

## 2017-11-18 DIAGNOSIS — I152 Hypertension secondary to endocrine disorders: Secondary | ICD-10-CM

## 2017-11-18 DIAGNOSIS — E1165 Type 2 diabetes mellitus with hyperglycemia: Secondary | ICD-10-CM

## 2017-11-18 DIAGNOSIS — IMO0002 Reserved for concepts with insufficient information to code with codable children: Secondary | ICD-10-CM

## 2017-11-18 LAB — CBC
HCT: 35 % — ABNORMAL LOW (ref 36.0–46.0)
Hemoglobin: 11.5 g/dL — ABNORMAL LOW (ref 12.0–15.0)
MCHC: 32.8 g/dL (ref 30.0–36.0)
MCV: 83.5 fl (ref 78.0–100.0)
Platelets: 259 10*3/uL (ref 150.0–400.0)
RBC: 4.19 Mil/uL (ref 3.87–5.11)
RDW: 14.5 % (ref 11.5–15.5)
WBC: 8.3 10*3/uL (ref 4.0–10.5)

## 2017-11-18 LAB — HEMOGLOBIN A1C: HEMOGLOBIN A1C: 10 % — AB (ref 4.6–6.5)

## 2017-11-18 LAB — BASIC METABOLIC PANEL
BUN: 19 mg/dL (ref 6–23)
CALCIUM: 9 mg/dL (ref 8.4–10.5)
CO2: 25 meq/L (ref 19–32)
Chloride: 106 mEq/L (ref 96–112)
Creatinine, Ser: 1.47 mg/dL — ABNORMAL HIGH (ref 0.40–1.20)
GFR: 46.32 mL/min — ABNORMAL LOW (ref >60.00)
Glucose, Bld: 140 mg/dL — ABNORMAL HIGH (ref 70–99)
Potassium: 4.1 mEq/L (ref 3.5–5.1)
Sodium: 139 mEq/L (ref 135–145)

## 2017-11-18 NOTE — Patient Instructions (Addendum)
BEFORE YOU LEAVE: -PHQ9 in epic -flu shot -labs -follow up: 3 months  CALL your endocrinologist for follow up.  RESTART your lasix and take every day.  Use compression socks daily.  We have ordered labs or studies at this visit. It can take up to 1-2 weeks for results and processing. IF results require follow up or explanation, we will call you with instructions. Clinically stable results will be released to your [facility]. If you have not heard from [place] or cannot find your results in [facility] in 2 weeks please contact our office at [phone].  If you are not yet signed up for [facility], please consider signing up.    We recommend the following healthy lifestyle for LIFE: 1) Small portions. But, make sure to get regular (at least 3 per day), healthy meals and small healthy snacks if needed.  2) Eat a healthy clean diet.   TRY TO EAT: -at least 5-7 servings of low sugar, colorful, and nutrient rich vegetables per day (not corn, potatoes or bananas.) -berries are the best choice if you wish to eat fruit (only eat small amounts if trying to reduce weight)  -lean meets (fish, white meat of chicken or [place]) -vegan proteins for some meals - beans or tofu, whole grains, nuts and seeds -Replace bad fats with good fats - good fats include: fish, nuts and seeds, canola oil, olive oil -small amounts of low fat or non fat dairy -small amounts of100 % whole grains - check the lables -drink plenty of water  AVOID: -SUGAR, sweets, anything with added sugar, corn syrup or sweeteners - must read labels as even foods advertised as "healthy" often are loaded with sugar -if you must have a sweetener, small amounts of stevia may be best -sweetened beverages and artificially sweetened beverages -simple starches (rice, bread, potatoes, pasta, chips, etc - small amounts of 100% whole grains are ok) -red meat, pork, butter -fried foods, fast food, processed food, excessive dairy, eggs and  coconut.  3)Get at least 150 minutes of sweaty aerobic exercise per week.  4)Reduce stress - consider counseling, meditation and relaxation to balance other aspects of your life.

## 2017-12-12 ENCOUNTER — Other Ambulatory Visit: Payer: Self-pay | Admitting: Family Medicine

## 2017-12-12 DIAGNOSIS — Z1231 Encounter for screening mammogram for malignant neoplasm of breast: Secondary | ICD-10-CM

## 2017-12-30 ENCOUNTER — Ambulatory Visit: Payer: BLUE CROSS/BLUE SHIELD | Admitting: Internal Medicine

## 2017-12-30 ENCOUNTER — Encounter: Payer: Self-pay | Admitting: Internal Medicine

## 2017-12-30 VITALS — BP 128/86 | HR 68 | Ht 71.0 in | Wt 303.0 lb

## 2017-12-30 DIAGNOSIS — E1122 Type 2 diabetes mellitus with diabetic chronic kidney disease: Secondary | ICD-10-CM

## 2017-12-30 DIAGNOSIS — Z794 Long term (current) use of insulin: Secondary | ICD-10-CM | POA: Diagnosis not present

## 2017-12-30 DIAGNOSIS — N183 Chronic kidney disease, stage 3 (moderate): Secondary | ICD-10-CM | POA: Diagnosis not present

## 2017-12-30 DIAGNOSIS — IMO0002 Reserved for concepts with insufficient information to code with codable children: Secondary | ICD-10-CM

## 2017-12-30 DIAGNOSIS — E1142 Type 2 diabetes mellitus with diabetic polyneuropathy: Secondary | ICD-10-CM

## 2017-12-30 DIAGNOSIS — E1169 Type 2 diabetes mellitus with other specified complication: Secondary | ICD-10-CM

## 2017-12-30 DIAGNOSIS — E1165 Type 2 diabetes mellitus with hyperglycemia: Secondary | ICD-10-CM | POA: Diagnosis not present

## 2017-12-30 DIAGNOSIS — E785 Hyperlipidemia, unspecified: Secondary | ICD-10-CM

## 2017-12-30 LAB — POCT GLYCOSYLATED HEMOGLOBIN (HGB A1C): Hemoglobin A1C: 8.1 % — AB (ref 4.0–5.6)

## 2017-12-30 MED ORDER — INSULIN ASPART 100 UNIT/ML FLEXPEN: PEN_INJECTOR | SUBCUTANEOUS | 3 refills | Status: DC

## 2017-12-30 MED ORDER — BASAGLAR KWIKPEN 100 UNIT/ML ~~LOC~~ SOPN: PEN_INJECTOR | SUBCUTANEOUS | 3 refills | Status: DC

## 2017-12-30 NOTE — Addendum Note (Signed)
Addended by: Cardell Peach I on: 12/30/2017 03:41 PM   Modules accepted: Orders

## 2017-12-30 NOTE — Patient Instructions (Addendum)
Please continue: - Basaglar 45 units in a.m. and 15-20 units at night  - Novolog dose: 20-25 units before meals, and 15-20 units before lunch  - Novolog Sliding scale: - 150- 165: + 1 unit  - 166- 180: + 2 units  - 181- 195: + 3 units  - 196- 210: + 4 units  - >210: + 5 units   Do not skip Novolog if you eat any meal, including lunch. Take Novolog only if you eat. Take Novolog 15 min before you eat or, the latest, while you eat.  Please return in 3-4 months with your sugar log.

## 2017-12-30 NOTE — Progress Notes (Signed)
Subjective:     Patient ID: Savannah Crawford, female   DOB: 1955/08/24, 62 y.o.   MRN: [id number]  HPI [name] is a 62 y.o. woman returning for f/u for DM2, insulin-dependent, uncontrolled, with complications (CAD, CKD, diabetes neuropathy, diabetic retinopathy, PVD, amputated toe-R). Last visit 11 months ago.  Last hemoglobin A1c was higher:: Lab Results  Component Value Date   HGBA1C 10.0 (H) 11/18/2017   HGBA1C 9.7 (H) 07/18/2017   HGBA1C 9.1 02/14/2017   She is on: - Basaglar 45 units in a.m. and 30-35 >> 15-20 units at night  - Novolog dose: 20-25 units before meals, and 15-20 units before lunch  (sometimes skips depending on the meal) - Novolog Sliding scale: - 150- 165: + 1 unit  - 166- 180: + 2 units  - 181- 195: + 3 units  - 196- 210: + 4 units  - >210: + 5 units  She was on metformin in the past, but stopped 2/2 CKD.  Tried Victoza before >> cannot remember SE.  She checks her sugars 2x times a day: - am:  104-192, 234 >> 74, 120, 140-160 >> 98-114 >> 67, 81-154, 182 - after b'fast: 128-183 >> n/c - prelunch: 115-170s >> 185 >> 170-180 >> n/c - after lunch: 236 - predinner: 112-239 >> 200s if skips insulin >> 176-180s >> 247 - bedtime: 148-404 >> 294 >> n/c >> 135-217, 366 Lowest: 78 >> 70s (at night) x 2 >> 74 >> 98 Highest sugar 239 >> 280s >> 215.  Meter: Freestyle Lite  + CKD, sees nephrology. Lab Results  Component Value Date   BUN 19 11/18/2017   CREATININE 1.47 (H) 11/18/2017  On lisinopril 10.  + HL; Latest lipids: Lab Results  Component Value Date   CHOL 130 07/18/2017   HDL 51.10 07/18/2017   LDLCALC 64 07/18/2017   TRIG 74.0 07/18/2017   CHOLHDL 3 07/18/2017  On Lipitor 20.  Last eye exam: 02/2017: No DR + numbness and tingling in feet ,stable.  She sees podiatry (Dr. Melony Overly) for L 3rd toe ulcer.  She also has a h/o nonobstructive CAD, with positive stress test but a negative cardiac cath 2012, and also has diastolic heart failure. She has HTN,  venous stasis, vitamin D deficiency.  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  Review of Systems Constitutional: no weight gain/no weight loss, no fatigue, no subjective hyperthermia, no subjective hypothermia Eyes: no blurry vision, no xerophthalmia ENT: no sore throat, no nodules palpated in neck, no dysphagia, no odynophagia, no hoarseness Cardiovascular: no CP/no SOB/no palpitations/no leg swelling Respiratory: no cough/no SOB/no wheezing Gastrointestinal: no N/no V/no D/no C/no acid reflux Musculoskeletal: no muscle aches/no joint aches Skin: no rashes, no hair loss Neurological: no tremors/+ numbness/+ tingling/no dizziness  I reviewed pt's medications, allergies, PMH, social hx, family hx, and changes were documented in the history of present illness. Otherwise, unchanged from my initial visit note.  Objective:   Physical Exam BP 128/86   Pulse 68   Ht 5\' 11"  (1.803 m) Comment: measured  Wt (!) 303 lb (137.4 kg)   SpO2 99%   BMI 42.26 kg/m  Body mass index is 42.26 kg/m. Wt Readings from Last 3 Encounters:  12/30/17 (!) 303 lb (137.4 kg)  11/18/17 (!) 302 lb 4.8 oz (137.1 kg)  07/18/17 (!) 311 lb 11.2 oz (141.4 kg)   Constitutional: overweight, in NAD Eyes: PERRLA, EOMI, no exophthalmos ENT: moist mucous membranes, no  thyromegaly, no cervical lymphadenopathy Cardiovascular: RRR, No MRG Respiratory: CTA B Gastrointestinal: abdomen soft, NT, ND, BS+ Musculoskeletal: no deformities, strength intact in all 4, left foot in boot Skin: moist, warm, no rashes Neurological: no tremor with outstretched hands, DTR normal in all 4   Assessment:     1. DM2, insulin-dependent, uncontrolled, with complications - nonobstructive CAD - CKD - diabetes neuropathy - diabetic retinopathy - PVD - R amputated toe  2. PN  3. Obesity class 3    Plan:     1. Pt with uncontrolled diabetes, on basal-bolus insulin regimen only, due to her CKD.   At previous visit, we also discussed about adding a GLP-1 receptor agonist, but she refused.  At last visit, sugars are better in the morning but they were increasing throughout the day as she was not using the recommended amount of NovoLog due to muscle cramps with the recommended dose.  She was also skipping insulin completely before lunch unless she had a dessert, in which case she was taking approximately 10 units of NovoLog.  Her sugars before dinner or 170-1 180s, so we discussed about the need to bolus for lunch, also. - Most recent HbA1c was higher than before, at 10%, but today, 8.1% (better) - She is still missing doses of insulin and only takes insulin with lunch seldom.  She checks mostly her sugars in the morning and they are close to goal but they increase throughout the day.  Sugars are high at bedtime and she noticed that she may have lows in the middle of the night, in the 60s.  Upon questioning, she decreased the dose of her Basaglar at bedtime but she is taking NovoLog at bedtime if she forgot to take it before dinner.  I advised her not to take NovoLog at bedtime at all to avoid further lows.  Due to the history of lows at night, will not increase the Basaglar but I strongly advised her to take NovoLog 15 minutes before every meal. -  I advised her to: Patient Instructions  Please continue: - Basaglar 45 units in a.m. and 15-20 units at night  - Novolog dose: 20-25 units before meals, and 15-20 units before lunch  - Novolog Sliding scale: - 150- 165: + 1 unit  - 166- 180: + 2 units  - 181- 195: + 3 units  - 196- 210: + 4 units  - >210: + 5 units   Do not skip Novolog if you eat any meal, including lunch. Take Novolog only if you eat. Take Novolog 15 min before you eat or, the latest, while you eat.  Please return in 3-4 months with your sugar log.   - continue checking sugars at different times of the day - check 3x a day, rotating checks - advised for yearly eye exams >>  she is UTD - UTD with flu shot - Return to clinic in 3-4 mo with sugar log    2. PN - 2./2 DM -Stable, no new complaints.  She continues to have occasional foot pain at night  3. HL - Reviewed latest lipid panel from 06/2017: at goal Lab Results  Component Value Date   CHOL 130 07/18/2017   HDL 51.10 07/18/2017   LDLCALC 64 07/18/2017   TRIG 74.0 07/18/2017   CHOLHDL 3 07/18/2017  - Continues Lipitor without side effects.  Philemon Kingdom, MD PhD [facility]

## 2018-01-21 ENCOUNTER — Ambulatory Visit
Admission: RE | Admit: 2018-01-21 | Discharge: 2018-01-21 | Disposition: A | Discharge status: Home / Self Care | Payer: BLUE CROSS/BLUE SHIELD | Source: Ambulatory Visit | Attending: Family Medicine | Admitting: Family Medicine

## 2018-01-21 DIAGNOSIS — Z1231 Encounter for screening mammogram for malignant neoplasm of breast: Secondary | ICD-10-CM

## 2018-02-06 ENCOUNTER — Ambulatory Visit (INDEPENDENT_AMBULATORY_CARE_PROVIDER_SITE_OTHER): Payer: BLUE CROSS/BLUE SHIELD | Admitting: Family Medicine

## 2018-02-06 ENCOUNTER — Encounter: Payer: Self-pay | Admitting: Family Medicine

## 2018-02-06 VITALS — BP 110/60 | HR 90 | Temp 98.9°F | Ht 71.0 in | Wt 302.6 lb

## 2018-02-06 DIAGNOSIS — L03115 Cellulitis of right lower limb: Secondary | ICD-10-CM

## 2018-02-06 MED ORDER — CEPHALEXIN 500 MG PO CAPS: 500.0000 mg | ORAL_CAPSULE | Freq: Three times a day (TID) | ORAL | 0 refills | Status: DC

## 2018-02-06 NOTE — Patient Instructions (Signed)
Start the keflex right away today  Continue the bactrim  Elevate leg 30 minutes twice daily  Walk every 1-1.5 hours if taking a care trip  Seek emergency care if worsening, feeling sick, fevers, etc; seek urgent care if not improving over the next 24 hours  Follow up here or if out of town at urgent care in 4-5 days to recheck

## 2018-02-06 NOTE — Progress Notes (Signed)
HPI:  Using dictation device. Unfortunately this device frequently misinterprets words/phrases.  Acute visit for rash R lower ext: -started as small area of rednes s4-5 das ago -gradually increasing with pain, warmth and swelling in this area -denies fevers, malaise, n/v, wound, drainage -on ext course abx doxy, and then now bactrim with podiatrist for L toe inf -reports has seen endo and renal specialist in last 2 months and labs ok -is leaving to go out of town early in the morning  ROS: See pertinent positives and negatives per HPI.  Past Medical History:  Diagnosis Date  . Anemia of chronic disease 11/17/2012  . At risk for heart disease - +stres test 2012 followed by [facility], [place] with neg cardiac cath 03/2010 (non-obstructive CAD) per review of records, echo 02/2010 normal LVF 01/15/2012  . Chronic kidney disease (CKD), stage IV (severe) ([place]) 01/09/2012  . Diabetes mellitus with renal manifestations, uncontrolled ([place])   . Diabetic neuropathy ([place]) 01/09/2012  . Diastolic heart failure - stage 1 per review of echo results from 02/2010 01/15/2012  . Hyperlipidemia   . Hypertension   . Obesity   . Peripheral arterial disease? Eval by vascular 2013 with no LE PAD and advised to stop pletal   . Severe obesity (BMI >= 40) ([place]) 11/13/2012  . Toe ulcer, right ([place])   . Ulcer of toe of left foot ([place])    treated by [facility] per patient  . Venous stasis of lower extremity 03/10/2012    Past Surgical History:  Procedure Laterality Date  . EYE SURGERY    . TOE AMPUTATION  2008    Family History  Problem Relation Age of Onset  . Diabetes Mother   . Hyperlipidemia Mother   . Hypertension Mother   . Heart attack Mother   . Stroke Mother   . Diabetes Sister   . Hypertension Brother   . Diabetes Sister   . Diabetes Sister   . Breast cancer Sister   . Diabetes Sister   . Obesity Other   . Hypertension Other   . Hyperlipidemia Other   . Stroke Other   .  Heart attack Other     SOCIAL HX: see hpi   Current Outpatient Medications:  .  atorvastatin (LIPITOR) 20 MG tablet, Take 1 tablet (20 mg total) by mouth daily., Disp: 90 tablet, Rfl: 3 .  B-D UF III MINI PEN NEEDLES 31G X 5 MM MISC, USE AS DIRECTED., Disp: 100 each, Rfl: 1 .  Blood Glucose Monitoring Suppl (FREESTYLE LITE) DEVI, Use to check sugar 2 times daily, Disp: 1 each, Rfl: 0 .  ferrous sulfate (CVS IRON) 325 (65 FE) MG tablet, TAKE 1 TALBET BY MOUTH TWICE DAILY WITH A MEAL, Disp: 60 tablet, Rfl: 1 .  furosemide (LASIX) 40 MG tablet, Take 1 tablet (40 mg total) by mouth 2 (two) times daily., Disp: 180 tablet, Rfl: 3 .  glucose blood (FREESTYLE LITE) test strip, Use as instructed to check the sugar 2 times daily, Disp: 200 each, Rfl: 5 .  insulin aspart (NOVOLOG FLEXPEN) 100 UNIT/ML FlexPen, INJECT UP TO 90 UNITS DAILY AS INSTRUCTED., Disp: 15 pen, Rfl: 3 .  Insulin Glargine (BASAGLAR KWIKPEN) 100 UNIT/ML SOPN, Inject up to 80 units a day at bedtime, Disp: 15 pen, Rfl: 3 .  Insulin Pen Needle (B-D ULTRAFINE III SHORT PEN) 31G X 8 MM MISC, Use as directed., Disp: 100 each, Rfl: 5 .  lisinopril (PRINIVIL,ZESTRIL) 10 MG tablet, Take 1  tablet (10 mg total) by mouth daily., Disp: 90 tablet, Rfl: 3 .  metoprolol succinate (TOPROL-XL) 25 MG 24 hr tablet, Take 1 tablet (25 mg total) by mouth daily., Disp: 90 tablet, Rfl: 3 .  cephALEXin (KEFLEX) 500 MG capsule, Take 1 capsule (500 mg total) by mouth 3 (three) times daily., Disp: 15 capsule, Rfl: 0  EXAM:  Vitals:   02/06/18 1344  BP: 110/60  Pulse: 90  Temp: 98.9 F (37.2 C)  SpO2: 97%    Body mass index is 42.2 kg/m.  GENERAL: vitals reviewed and listed above, alert, oriented, appears well hydrated and in no acute distress  HEENT: atraumatic, conjunttiva clear, no obvious abnormalities on inspection of external nose and ears  NECK: no obvious masses on inspection  LUNGS: clear to auscultation bilaterally, no wheezes, rales or  rhonchi, good air movement  CV: HRRR, no peripheral edema  SKIN: patchy area of erythema, induration and warmth with sharply demarcated border R anteromedial leg  MS: moves all extremities without noticeable abnormality  PSYCH: pleasant and cooperative, no obvious depression or anxiety  ASSESSMENT AND PLAN:  Discussed the following assessment and plan:  Cellulitis of right lower extremity   -we discussed possible serious and likely etiologies, workup and treatment, treatment risks and return precautions - suspect ? Strep cellulitis vs other; advised LE [place] (though DVT unlikely given findings), abx coverage for strep, close follow up and ER if worsening -after this discussion, Savannah Crawford opted for adding keflex, continue bactrim declined [place] aas leaving town, but agrees to go to ER if worsening or ucc if not improving over next 24 hourse -follow up advised 4-5 days -of course, we advised Savannah Crawford  to return or notify a doctor immediately if symptoms worsen or persist or new concerns arise.   Patient Instructions  Start the keflex right away today  Continue the bactrim  Elevate leg 30 minutes twice daily  Walk every 1-1.5 hours if taking a care trip  Seek emergency care if worsening, feeling sick, fevers, etc; seek urgent care if not improving over the next 24 hours  Follow up here or if out of town at urgent care in 4-5 days to recheck   Savannah Kern, DO

## 2018-02-17 ENCOUNTER — Ambulatory Visit: Payer: BLUE CROSS/BLUE SHIELD | Admitting: Family Medicine

## 2018-02-20 ENCOUNTER — Ambulatory Visit: Payer: BLUE CROSS/BLUE SHIELD | Admitting: Family Medicine

## 2018-02-24 ENCOUNTER — Ambulatory Visit: Payer: BLUE CROSS/BLUE SHIELD | Admitting: Family Medicine

## 2018-02-25 ENCOUNTER — Encounter: Payer: Self-pay | Admitting: Family Medicine

## 2018-02-25 ENCOUNTER — Ambulatory Visit (INDEPENDENT_AMBULATORY_CARE_PROVIDER_SITE_OTHER): Payer: BLUE CROSS/BLUE SHIELD | Admitting: Family Medicine

## 2018-02-25 ENCOUNTER — Ambulatory Visit (INDEPENDENT_AMBULATORY_CARE_PROVIDER_SITE_OTHER): Payer: BLUE CROSS/BLUE SHIELD

## 2018-02-25 VITALS — BP 140/80 | HR 71 | Temp 97.9°F | Ht 71.0 in | Wt 303.5 lb

## 2018-02-25 DIAGNOSIS — Z794 Long term (current) use of insulin: Secondary | ICD-10-CM

## 2018-02-25 DIAGNOSIS — L03115 Cellulitis of right lower limb: Secondary | ICD-10-CM

## 2018-02-25 DIAGNOSIS — E1165 Type 2 diabetes mellitus with hyperglycemia: Secondary | ICD-10-CM

## 2018-02-25 DIAGNOSIS — I878 Other specified disorders of veins: Secondary | ICD-10-CM

## 2018-02-25 DIAGNOSIS — N184 Chronic kidney disease, stage 4 (severe): Secondary | ICD-10-CM | POA: Diagnosis not present

## 2018-02-25 DIAGNOSIS — IMO0002 Reserved for concepts with insufficient information to code with codable children: Secondary | ICD-10-CM

## 2018-02-25 DIAGNOSIS — N183 Chronic kidney disease, stage 3 (moderate): Secondary | ICD-10-CM

## 2018-02-25 DIAGNOSIS — E1122 Type 2 diabetes mellitus with diabetic chronic kidney disease: Secondary | ICD-10-CM

## 2018-02-25 NOTE — Patient Instructions (Signed)
BEFORE YOU LEAVE: -labs -xray -papers from outside hospital - physicians notes and labs, have pt call you with info -follow up: 5-7 days  Call your foot specialist for follow up today.  Work with your diabetes doctor to get your sugar under control. Take your medications EVERY DAY. DO NOT miss doses.  Eat a healthy low sugar diet.  Wear compression sock daily. Remove at night.  Elevate legs for 30 minutes twice daily.  We have ordered labs or studies at this visit. It can take up to 1-2 weeks for results and processing. IF results require follow up or explanation, we will call you with instructions. Clinically stable results will be released to your [facility]. If you have not heard from [place] or cannot find your results in [facility] in 2 weeks please contact our office at [phone].  If you are not yet signed up for [facility], please consider signing up.  Continue the course of antibiotic. Follow up promptly if any increased swelling, redness or worsening after finishing antibiotic. Seek immediate care if worsenign quickly, fevers or feeling sick.

## 2018-02-25 NOTE — Progress Notes (Signed)
HPI:  Using dictation device. Unfortunately this device frequently misinterprets words/phrases.  Follow up LE edema/Cellulitis R LE: -she ended up going to ER when traveling last week as erythema worsened and she had fever and malaise -she can not remember the name of the hospital and has none of the paperwork with her -reports they did labs, gave her IV abx and discharged her on oral clinda - she has several doses left -she reports doing much better with fevers and malaise resolved and skin much better with decreased redness, pain and swelling -hx diabetes, sees specialist for management - reports diabetes was well controlled, when I ask her for recent sugars however  -they have not been great. Diet is poor. She admits to frequently not taking her medications as prescribe. -denies cost issues with medications, lack of refills, fear of medications or side effects. -reports she simply has a lot going on and it seems taking her medications properly and eating healthy have not been priorities for her -she is currently seeing podiatry for a toe infection on the L foot and reports is going to have to have surgery for this  ROS: See pertinent positives and negatives per HPI.  Past Medical History:  Diagnosis Date  . Anemia of chronic disease 11/17/2012  . At risk for heart disease - +stres test 2012 followed by [facility], [place] with neg cardiac cath 03/2010 (non-obstructive CAD) per review of records, echo 02/2010 normal LVF 01/15/2012  . Chronic kidney disease (CKD), stage IV (severe) ([place]) 01/09/2012  . Diabetes mellitus with renal manifestations, uncontrolled ([place])   . Diabetic neuropathy ([place]) 01/09/2012  . Diastolic heart failure - stage 1 per review of echo results from 02/2010 01/15/2012  . Hyperlipidemia   . Hypertension   . Obesity   . Peripheral arterial disease? Eval by vascular 2013 with no LE PAD and advised to stop pletal   . Severe obesity (BMI >= 40) ([place]) 11/13/2012  .  Toe ulcer, right ([place])   . Ulcer of toe of left foot ([place])    treated by [facility] per patient  . Venous stasis of lower extremity 03/10/2012    Past Surgical History:  Procedure Laterality Date  . EYE SURGERY    . TOE AMPUTATION  2008    Family History  Problem Relation Age of Onset  . Diabetes Mother   . Hyperlipidemia Mother   . Hypertension Mother   . Heart attack Mother   . Stroke Mother   . Diabetes Sister   . Hypertension Brother   . Diabetes Sister   . Diabetes Sister   . Breast cancer Sister   . Diabetes Sister   . Obesity Other   . Hypertension Other   . Hyperlipidemia Other   . Stroke Other   . Heart attack Other     SOCIAL HX: see hpi   Current Outpatient Medications:  .  atorvastatin (LIPITOR) 20 MG tablet, Take 1 tablet (20 mg total) by mouth daily., Disp: 90 tablet, Rfl: 3 .  B-D UF III MINI PEN NEEDLES 31G X 5 MM MISC, USE AS DIRECTED., Disp: 100 each, Rfl: 1 .  Blood Glucose Monitoring Suppl (FREESTYLE LITE) DEVI, Use to check sugar 2 times daily, Disp: 1 each, Rfl: 0 .  cephALEXin (KEFLEX) 500 MG capsule, Take 1 capsule (500 mg total) by mouth 3 (three) times daily., Disp: 15 capsule, Rfl: 0 .  ferrous sulfate (CVS IRON) 325 (65 FE) MG tablet, TAKE 1 TALBET BY  MOUTH TWICE DAILY WITH A MEAL, Disp: 60 tablet, Rfl: 1 .  furosemide (LASIX) 40 MG tablet, Take 1 tablet (40 mg total) by mouth 2 (two) times daily., Disp: 180 tablet, Rfl: 3 .  glucose blood (FREESTYLE LITE) test strip, Use as instructed to check the sugar 2 times daily, Disp: 200 each, Rfl: 5 .  insulin aspart (NOVOLOG FLEXPEN) 100 UNIT/ML FlexPen, INJECT UP TO 90 UNITS DAILY AS INSTRUCTED., Disp: 15 pen, Rfl: 3 .  Insulin Glargine (BASAGLAR KWIKPEN) 100 UNIT/ML SOPN, Inject up to 80 units a day at bedtime, Disp: 15 pen, Rfl: 3 .  Insulin Pen Needle (B-D ULTRAFINE III SHORT PEN) 31G X 8 MM MISC, Use as directed., Disp: 100 each, Rfl: 5 .  lisinopril (PRINIVIL,ZESTRIL) 10 MG tablet, Take 1  tablet (10 mg total) by mouth daily., Disp: 90 tablet, Rfl: 3 .  metoprolol succinate (TOPROL-XL) 25 MG 24 hr tablet, Take 1 tablet (25 mg total) by mouth daily., Disp: 90 tablet, Rfl: 3  EXAM:  Vitals:   02/25/18 1614  BP: 140/80  Pulse: 71  Temp: 97.9 F (36.6 C)    Body mass index is 42.33 kg/m.  GENERAL: vitals reviewed and listed above, alert, oriented, appears well hydrated and in no acute distress  HEENT: atraumatic, conjunttiva clear, no obvious abnormalities on inspection of external nose and ears  NECK: no obvious masses on inspection  LUNGS: clear to auscultation bilaterally, no wheezes, rales or rhonchi, good air movement  CV: HRRR, bilat LE edema 1+ in ankles  SKIN: improved appearance redness in R ant LE with minimal induration, but still with small portion induration/erythema ant leg, chronic venous stasis changes, no ulcers  MS: moves all extremities without noticeable abnormality, L foot in boot  PSYCH: pleasant and cooperative, no obvious depression or anxiety  ASSESSMENT AND PLAN:  Discussed the following assessment and plan:  Cellulitis of right lower extremity - Plan: Basic metabolic panel, CBC, DG Tibia/Fibula Right  Uncontrolled type 2 diabetes mellitus with stage 3 chronic kidney disease, with long-term current use of insulin (HCC)  Venous stasis of lower extremity  Chronic kidney disease (CKD), stage IV (severe) (HCC)  Severe obesity (BMI >= 40) (HCC)  -complete clinda -check labs given recent abx and may require further and plain films lower leg given difficult to treat cellulitis - though labs could be off from what sounds like a chronic toe infection - seeing podiatry for this and reports will see again now that back in town - advised she follow up with them this week -had length discussion about implications and significant risks with uncontrolled diabetes and that this can lead to severe infections and sometimes amputations in diabetics -  she has had prior toe amputation -assessed for barrier to better compliance and she admits just needs to do better and make her health a priority - advised taking all medications every day as prescribed and eating a healthy diet -advised she provide [place] info for OSH and advised assistant to obtain records if possible -advised she work with her diabetes doctor to get her diabetes under control -she uses compression - but her socks are worn out - she agrees to get new compression socks -advised elevating legs twice daily for 20 minutes -follow up 5-7 days -Patient advised to return or notify a doctor immediately if symptoms worsen or persist or new concerns arise.  Patient Instructions  BEFORE YOU LEAVE: -labs -xray -papers from outside hospital - physicians notes and labs, have pt  call you with info -follow up: 5-7 days  Call your foot specialist for follow up today.  Work with your diabetes doctor to get your sugar under control. Take your medications EVERY DAY. DO NOT miss doses.  Eat a healthy low sugar diet.  Wear compression sock daily. Remove at night.  Elevate legs for 30 minutes twice daily.  We have ordered labs or studies at this visit. It can take up to 1-2 weeks for results and processing. IF results require follow up or explanation, we will call you with instructions. Clinically stable results will be released to your [facility]. If you have not heard from [place] or cannot find your results in [facility] in 2 weeks please contact our office at [phone].  If you are not yet signed up for [facility], please consider signing up.  Continue the course of antibiotic. Follow up promptly if any increased swelling, redness or worsening after finishing antibiotic. Seek immediate care if worsenign quickly, fevers or feeling sick.    Lucretia Kern, DO

## 2018-02-26 ENCOUNTER — Other Ambulatory Visit: Payer: Self-pay | Admitting: Family Medicine

## 2018-02-26 LAB — BASIC METABOLIC PANEL
BUN: 20 mg/dL (ref 6–23)
CALCIUM: 9 mg/dL (ref 8.4–10.5)
CO2: 26 meq/L (ref 19–32)
CREATININE: 1.37 mg/dL — AB (ref 0.40–1.20)
Chloride: 103 mEq/L (ref 96–112)
GFR: 50.2 mL/min — AB (ref >60.00)
GLUCOSE: 135 mg/dL — AB (ref 70–99)
Potassium: 4 mEq/L (ref 3.5–5.1)
Sodium: 137 mEq/L (ref 135–145)

## 2018-02-26 LAB — CBC
HCT: 35.4 % — ABNORMAL LOW (ref 36.0–46.0)
Hemoglobin: 11.1 g/dL — ABNORMAL LOW (ref 12.0–15.0)
MCHC: 31.5 g/dL (ref 30.0–36.0)
MCV: 85.3 fl (ref 78.0–100.0)
Platelets: 329 10*3/uL (ref 150.0–400.0)
RBC: 4.15 Mil/uL (ref 3.87–5.11)
RDW: 14.3 % (ref 11.5–15.5)
WBC: 8.5 10*3/uL (ref 4.0–10.5)

## 2018-02-28 ENCOUNTER — Other Ambulatory Visit: Payer: Self-pay | Admitting: Family Medicine

## 2018-03-01 NOTE — Progress Notes (Deleted)
HPI:  Using dictation device. Unfortunately this device frequently misinterprets words/phrases.  Savannah Crawford is a pleasant 63 y.o. here for follow up. Chronic medical problems summarized below were reviewed for changes and stability and were updated as needed below.  She unfortunately has been battling a case of cellulitis of the Right leg and toe infection L (seeing podiatry) - see recent notes for details. She had several rounds of abx, ER trip when [place] of state with IV abx and then clinda, xray neg here for bony involvement. She also has a history of very poor compliance and self reported poor prioritizing of her health, taking her meds, eating right, etc. She has unfortunately paid a high price for this, with uncontrolled diabetes and complications.***. Denies CP, SOB, DOE, treatment intolerance or new symptoms.  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 Cruzita Lederer) and optho, podiatry, nephrologist (Dr. Gwynneth Macleod) -On insulin  Chronic iron def anemia/anemia chronic dz: -takes iron  ROS: See pertinent positives and negatives per HPI.  Past Medical History:  Diagnosis Date  . Anemia of chronic disease 11/17/2012  . At risk for heart disease - +stres test 2012 followed by [facility], [place] with neg cardiac cath 03/2010 (non-obstructive CAD) per review of records, echo 02/2010 normal LVF 01/15/2012  . Chronic kidney disease (CKD), stage IV (severe) ([place]) 01/09/2012  . Diabetes mellitus with renal manifestations, uncontrolled ([place])   . Diabetic neuropathy ([place]) 01/09/2012  . Diastolic heart failure - stage 1 per review of echo results from 02/2010 01/15/2012  . Hyperlipidemia   . Hypertension   . Obesity   . Peripheral arterial disease? Eval by  vascular 2013 with no LE PAD and advised to stop pletal   . Severe obesity (BMI >= 40) ([place]) 11/13/2012  . Toe ulcer, right ([place])   . Ulcer of toe of left foot ([place])    treated by [facility] per patient  . Venous stasis of lower extremity 03/10/2012    Past Surgical History:  Procedure Laterality Date  . EYE SURGERY    . TOE AMPUTATION  2008    Family History  Problem Relation Age of Onset  . Diabetes Mother   . Hyperlipidemia Mother   . Hypertension Mother   . Heart attack Mother   . Stroke Mother   . Diabetes Sister   . Hypertension Brother   . Diabetes Sister   . Diabetes Sister   . Breast cancer Sister   . Diabetes Sister   . Obesity Other   . Hypertension Other   . Hyperlipidemia Other   . Stroke Other   . Heart attack Other     SOCIAL HX: ***   Current Outpatient Medications:  .  atorvastatin (LIPITOR) 20 MG tablet, Take 1 tablet (20 mg total) by mouth daily., Disp: 90 tablet, Rfl: 3 .  B-D UF III MINI PEN NEEDLES 31G X 5 MM MISC, USE AS DIRECTED., Disp: 100 each, Rfl: 1 .  Blood Glucose Monitoring Suppl (FREESTYLE LITE) DEVI, Use to check sugar 2 times daily, Disp: 1 each, Rfl: 0 .  cephALEXin (KEFLEX) 500 MG capsule, Take 1 capsule (500 mg total) by mouth 3 (three) times daily., Disp: 15 capsule, Rfl: 0 .  ferrous sulfate (CVS IRON) 325 (65 FE) MG tablet, TAKE 1 TALBET BY MOUTH TWICE DAILY WITH  A MEAL, Disp: 60 tablet, Rfl: 1 .  furosemide (LASIX) 40 MG tablet, Take 1 tablet (40 mg total) by mouth 2 (two) times daily., Disp: 180 tablet, Rfl: 3 .  glucose blood (FREESTYLE LITE) test strip, Use as instructed to check the sugar 2 times daily, Disp: 200 each, Rfl: 5 .  insulin aspart (NOVOLOG FLEXPEN) 100 UNIT/ML FlexPen, INJECT UP TO 90 UNITS DAILY AS INSTRUCTED., Disp: 15 pen, Rfl: 3 .  Insulin Glargine (BASAGLAR KWIKPEN) 100 UNIT/ML SOPN, Inject up to 80 units a day at bedtime, Disp: 15 pen, Rfl: 3 .  Insulin Pen Needle (B-D ULTRAFINE III SHORT PEN) 31G X 8 MM  MISC, Use as directed., Disp: 100 each, Rfl: 5 .  lisinopril (PRINIVIL,ZESTRIL) 10 MG tablet, TAKE 1 TABLET BY MOUTH EVERY DAY, Disp: 90 tablet, Rfl: 1 .  metoprolol succinate (TOPROL-XL) 25 MG 24 hr tablet, TAKE 1 TABLET BY MOUTH EVERY DAY, Disp: 90 tablet, Rfl: 1  EXAM:  There were no vitals filed for this visit.  There is no height or weight on file to calculate BMI.  GENERAL: vitals reviewed and listed above, alert, oriented, appears well hydrated and in no acute distress  HEENT: atraumatic, conjunttiva clear, no obvious abnormalities on inspection of external nose and ears  NECK: no obvious masses on inspection  LUNGS: clear to auscultation bilaterally, no wheezes, rales or rhonchi, good air movement  CV: HRRR, no peripheral edema  MS: moves all extremities without noticeable abnormality *** PSYCH: pleasant and cooperative, no obvious depression or anxiety  ASSESSMENT AND PLAN:  Discussed the following assessment and plan:  No diagnosis found.  *** -Patient advised to return or notify a doctor immediately if symptoms worsen or persist or new concerns arise.  There are no Patient Instructions on file for this visit.  Lucretia Kern, DO

## 2018-03-04 ENCOUNTER — Ambulatory Visit: Payer: BLUE CROSS/BLUE SHIELD | Admitting: Family Medicine

## 2018-03-29 ENCOUNTER — Other Ambulatory Visit: Payer: Self-pay | Admitting: Family Medicine

## 2018-05-06 ENCOUNTER — Other Ambulatory Visit: Payer: Self-pay

## 2018-05-06 ENCOUNTER — Encounter: Payer: Self-pay | Admitting: Internal Medicine

## 2018-05-06 ENCOUNTER — Ambulatory Visit (INDEPENDENT_AMBULATORY_CARE_PROVIDER_SITE_OTHER): Payer: BLUE CROSS/BLUE SHIELD | Admitting: Internal Medicine

## 2018-05-06 VITALS — BP 128/60 | HR 77 | Ht 71.0 in | Wt 300.0 lb

## 2018-05-06 DIAGNOSIS — IMO0002 Reserved for concepts with insufficient information to code with codable children: Secondary | ICD-10-CM

## 2018-05-06 DIAGNOSIS — N183 Chronic kidney disease, stage 3 (moderate): Secondary | ICD-10-CM | POA: Diagnosis not present

## 2018-05-06 DIAGNOSIS — E1122 Type 2 diabetes mellitus with diabetic chronic kidney disease: Secondary | ICD-10-CM | POA: Diagnosis not present

## 2018-05-06 DIAGNOSIS — Z794 Long term (current) use of insulin: Secondary | ICD-10-CM | POA: Diagnosis not present

## 2018-05-06 DIAGNOSIS — E1165 Type 2 diabetes mellitus with hyperglycemia: Secondary | ICD-10-CM

## 2018-05-06 DIAGNOSIS — E785 Hyperlipidemia, unspecified: Secondary | ICD-10-CM

## 2018-05-06 DIAGNOSIS — E1142 Type 2 diabetes mellitus with diabetic polyneuropathy: Secondary | ICD-10-CM

## 2018-05-06 DIAGNOSIS — E1169 Type 2 diabetes mellitus with other specified complication: Secondary | ICD-10-CM | POA: Diagnosis not present

## 2018-05-06 LAB — POCT GLYCOSYLATED HEMOGLOBIN (HGB A1C): Hemoglobin A1C: 9.2 % — AB (ref 4.0–5.6)

## 2018-05-06 MED ORDER — INSULIN PEN NEEDLE 31G X 5 MM MISC: 5 refills | Status: DC

## 2018-05-06 MED ORDER — GLUCOSE BLOOD VI STRP: ORAL_STRIP | 5 refills | Status: DC

## 2018-05-06 NOTE — Addendum Note (Signed)
Addended by: Cardell Peach I on: 05/06/2018 02:31 PM   Modules accepted: Orders

## 2018-05-06 NOTE — Progress Notes (Signed)
Subjective:     Patient ID: Savannah Crawford, female   DOB: 01-28-1956, 63 y.o.   MRN: [id number]  HPI [name] is a 63 y.o. woman returning for f/u for DM2, insulin-dependent, uncontrolled, with complications (CAD, CKD, diabetes neuropathy, diabetic retinopathy, PVD, amputated toe-R). Last visit 4 months ago.  She had cellulitis since last OV >> was ABx. Sugars were a little higher then (01/2018).  Last hemoglobin A1c was higher:: Lab Results  Component Value Date   HGBA1C 8.1 (A) 12/30/2017   HGBA1C 10.0 (H) 11/18/2017   HGBA1C 9.7 (H) 07/18/2017   She is on: - Basaglar 40-42 units in a.m. and 15-20 units at night  - Novolog dose: 20-25 units before meals, and 15-20 units before lunch  - Novolog Sliding scale: - 150- 165: + 1 unit  - 166- 180: + 2 units  - 181- 195: + 3 units  - 196- 210: + 4 units  - >210: + 5 units  Do not skip Novolog if you eat any meal, including lunch. Take Novolog only if you eat. Take Novolog 15 min before you eat or, the latest, while you eat. She was on metformin in the past, but stopped 2/2 CKD.  Tried Victoza before >> cannot remember SE.  She was checking her sugars twice a day >> not checking now. From February: - am 98-114 >> 67, 81-154, 182 >> 88-180, 217 - after b'fast: 128-183 >> n/c - prelunch:185 >> 170-180 >> n/c - after lunch: 236 - predinner: 176-180s >> 247 >> n/c - bedtime: 294 >> n/c >> 135-217, 366 >> n/c Lowest: 74 >> 98 >> 70 (took insulin and did not eat much) Highest sugar 215>> 200s .  Meter: Freestyle Lite  + CKD-sees nephrology: Lab Results  Component Value Date   BUN 20 02/25/2018   CREATININE 1.37 (H) 02/25/2018  On lisinopril 10.  + HL; Latest lipids: Lab Results  Component Value Date   CHOL 130 07/18/2017   HDL 51.10 07/18/2017   LDLCALC 64 07/18/2017   TRIG 74.0 07/18/2017   CHOLHDL 3 07/18/2017  On Lipitor 20.  Last eye exam 02/2017: No DR + Numbness and tingling in feet, stable.  She sees podiatry (Dr.  Melony Overly) for left third toe ulcer  She also has a h/o nonobstructive CAD, with positive stress test but a negative cardiac cath 2012, and also has diastolic heart failure. She has HTN, venous stasis, vitamin D deficiency.  She has an ulcer on a left toe -sees podiatry.  She had skin grafts.  Review of Systems Constitutional: no weight gain/no weight loss, no fatigue, no subjective hyperthermia, no subjective hypothermia Eyes: no blurry vision, no xerophthalmia ENT: no sore throat, no nodules palpated in neck, no dysphagia, no odynophagia, no hoarseness Cardiovascular: no CP/no SOB/no palpitations/no leg swelling Respiratory: no cough/no SOB/no wheezing Gastrointestinal: no N/no V/no D/no C/no acid reflux Musculoskeletal: no muscle aches/no joint aches Skin: no rashes, no hair loss Neurological: no tremors/+ numbness/+ tingling/no dizziness  I reviewed pt's medications, allergies, PMH, social hx, family hx, and changes were documented in the history of present illness. Otherwise, unchanged from my initial visit note.  Past Medical History:  Diagnosis Date  . Anemia of chronic disease 11/17/2012  . At risk for heart disease - +stres test 2012 followed by [facility], [place] with neg cardiac cath 03/2010 (non-obstructive CAD) per review of records, echo 02/2010 normal LVF 01/15/2012  . Chronic kidney disease (CKD), stage IV (severe) ([place]) 01/09/2012  .  Diabetes mellitus with renal manifestations, uncontrolled ([place])   . Diabetic neuropathy ([place]) 01/09/2012  . Diastolic heart failure - stage 1 per review of echo results from 02/2010 01/15/2012  . Hyperlipidemia   . Hypertension   . Obesity   . Peripheral arterial disease? Eval by vascular 2013 with no LE PAD and advised to stop pletal   . Severe obesity (BMI >= 40) ([place]) 11/13/2012  . Toe ulcer, right ([place])   . Ulcer of toe of left foot ([place])    treated by [facility] per patient  . Venous stasis of lower extremity 03/10/2012   Past  Surgical History:  Procedure Laterality Date  . EYE SURGERY    . TOE AMPUTATION  2008   Social History   Socioeconomic History  . Marital status: Married    Spouse name: Not on file  . Number of children: Not on file  . Years of education: Not on file  . Highest education level: Not on file  Occupational History  . Not on file  Social Needs  . Financial resource strain: Not on file  . Food insecurity:    Worry: Not on file    Inability: Not on file  . Transportation needs:    Medical: Not on file    Non-medical: Not on file  Tobacco Use  . Smoking status: Former Smoker    Types: Cigarettes    Last attempt to quit: 02/20/1999    Years since quitting: 19.2  . Smokeless tobacco: Never Used  Substance and Sexual Activity  . Alcohol use: Yes  . Drug use: Yes  . Sexual activity: Not on file  Lifestyle  . Physical activity:    Days per week: Not on file    Minutes per session: Not on file  . Stress: Not on file  Relationships  . Social connections:    Talks on phone: Not on file    Gets together: Not on file    Attends religious service: Not on file    Active member of club or organization: Not on file    Attends meetings of clubs or organizations: Not on file    Relationship status: Not on file  . Intimate partner violence:    Fear of current or ex partner: Not on file    Emotionally abused: Not on file    Physically abused: Not on file    Forced sexual activity: Not on file  Other Topics Concern  . Not on file  Social History Narrative  . Not on file   Current Outpatient Medications on File Prior to Visit  Medication Sig Dispense Refill  . atorvastatin (LIPITOR) 20 MG tablet TAKE 1 TABLET BY MOUTH EVERY DAY 90 tablet 0  . B-D UF III MINI PEN NEEDLES 31G X 5 MM MISC USE AS DIRECTED. 100 each 1  . Blood Glucose Monitoring Suppl (FREESTYLE LITE) DEVI Use to check sugar 2 times daily 1 each 0  . cephALEXin (KEFLEX) 500 MG capsule Take 1 capsule (500 mg total) by  mouth 3 (three) times daily. 15 capsule 0  . ferrous sulfate (CVS IRON) 325 (65 FE) MG tablet TAKE 1 TALBET BY MOUTH TWICE DAILY WITH A MEAL 60 tablet 1  . furosemide (LASIX) 40 MG tablet TAKE 1 TABLET BY MOUTH TWICE A DAY 180 tablet 0  . glucose blood (FREESTYLE LITE) test strip Use as instructed to check the sugar 2 times daily 200 each 5  . insulin aspart (NOVOLOG FLEXPEN) 100 UNIT/ML  FlexPen INJECT UP TO 90 UNITS DAILY AS INSTRUCTED. 15 pen 3  . Insulin Glargine (BASAGLAR KWIKPEN) 100 UNIT/ML SOPN Inject up to 80 units a day at bedtime 15 pen 3  . Insulin Pen Needle (B-D ULTRAFINE III SHORT PEN) 31G X 8 MM MISC Use as directed. 100 each 5  . lisinopril (PRINIVIL,ZESTRIL) 10 MG tablet TAKE 1 TABLET BY MOUTH EVERY DAY 90 tablet 1  . metoprolol succinate (TOPROL-XL) 25 MG 24 hr tablet TAKE 1 TABLET BY MOUTH EVERY DAY 90 tablet 1   No current facility-administered medications on file prior to visit.    No Known Allergies Family History  Problem Relation Age of Onset  . Diabetes Mother   . Hyperlipidemia Mother   . Hypertension Mother   . Heart attack Mother   . Stroke Mother   . Diabetes Sister   . Hypertension Brother   . Diabetes Sister   . Diabetes Sister   . Breast cancer Sister   . Diabetes Sister   . Obesity Other   . Hypertension Other   . Hyperlipidemia Other   . Stroke Other   . Heart attack Other    Objective:   Physical Exam There were no vitals taken for this visit. There is no height or weight on file to calculate BMI. Wt Readings from Last 3 Encounters:  02/25/18 (!) 303 lb 8 oz (137.7 kg)  02/06/18 (!) 302 lb 9.6 oz (137.3 kg)  12/30/17 (!) 303 lb (137.4 kg)   Constitutional: overweight, in NAD Eyes: PERRLA, EOMI, no exophthalmos ENT: moist mucous membranes, no thyromegaly, no cervical lymphadenopathy Cardiovascular: RRR, No MRG Respiratory: CTA B Gastrointestinal: abdomen soft, NT, ND, BS+ Musculoskeletal: no deformities, strength intact in all 4 Skin:  moist, warm, no rashes Neurological: no tremor with outstretched hands, DTR normal in all 4  Assessment:     1. DM2, insulin-dependent, uncontrolled, with complications - nonobstructive CAD - CKD - diabetes neuropathy - diabetic retinopathy - PVD - R amputated toe  2. PN  3. Obesity class 3    Plan:     1. Pt with uncontrolled diabetes, on basal-bolus insulin regimen only, due to her CKD.  At last visit, we discussed about adding the GLP-1 receptor agonist, but she refused.  At last visit, HbA1c was better, at 8.1%.  At that time, she was still missing doses of insulin and only taking insulin at lunch inconsistently.  Sugars were high at that time and she noticed that she may have lows in the middle of the night, in the 60s.  Upon questioning, she decreased the dose of Basaglar at bedtime but she was taking NovoLog at that time if she forgot to take it before dinner.  We discussed about not taking NovoLog at bedtime to avoid further lows at night.  We did not increase Basaglar at that time but I strongly advised her to not skip NovoLog doses and to take these 15 minutes before a meal -At this visit, she is not checking sugars in the last month.  Last month she had some sugar checks and they were increasing, but unfortunately, she only checked in the morning and then she stopped checking completely.  We discussed about the importance of checking sugars 3 times a day since she is on a complex insulin regimen.  She agrees to start doing so.  Otherwise, without more data, I cannot fine-tune her regimen for now. -  I advised her to: Patient Instructions  Please continue: - Engineer, agricultural  45 units in a.m. and 15-20 units at night  - Novolog dose: 20-25 units before meals, and 15-20 units before lunch  - Novolog Sliding scale: - 150- 165: + 1 unit  - 166- 180: + 2 units  - 181- 195: + 3 units  - 196- 210: + 4 units  - >210: + 5 units  Do not skip Novolog if you eat any meal, including lunch. Take  Novolog only if you eat. Take Novolog 15 min before you eat or, the latest, while you eat.  Start checking sugars 3x a day.  Please return in 3-4 months with your sugar log.   - today, HbA1c is 9.2% (higher) - continue checking sugars at different times of the day - check 3x a day, rotating checks - advised for yearly eye exams >> she is due - Return to clinic in 3-4 mo with sugar log    2. PN -Due to diabetes -She continues to have occasional foot pain at night, but no new complaints -Continues to see podiatry  3. HL - Reviewed latest lipid panel from 06/2017: All fractions at goal Lab Results  Component Value Date   CHOL 130 07/18/2017   HDL 51.10 07/18/2017   LDLCALC 64 07/18/2017   TRIG 74.0 07/18/2017   CHOLHDL 3 07/18/2017  - Continues Lipitor without side effects.  Philemon Kingdom, MD PhD [facility] Endocr[facility]

## 2018-05-06 NOTE — Patient Instructions (Addendum)
Please continue: - Basaglar 45 units in a.m. and 15-20 units at night  - Novolog dose: 20-25 units before meals, and 15-20 units before lunch  - Novolog Sliding scale: - 150- 165: + 1 unit  - 166- 180: + 2 units  - 181- 195: + 3 units  - 196- 210: + 4 units  - >210: + 5 units  Do not skip Novolog if you eat any meal, including lunch. Take Novolog only if you eat. Take Novolog 15 min before you eat or, the latest, while you eat.  Start checking sugars 3x a day.  Please return in 3-4 months with your sugar log.

## 2018-06-25 ENCOUNTER — Other Ambulatory Visit: Payer: Self-pay | Admitting: Family Medicine

## 2018-08-04 ENCOUNTER — Other Ambulatory Visit: Payer: Self-pay | Admitting: Internal Medicine

## 2018-08-25 ENCOUNTER — Other Ambulatory Visit: Payer: Self-pay | Admitting: Family Medicine

## 2018-09-05 ENCOUNTER — Ambulatory Visit: Payer: BLUE CROSS/BLUE SHIELD | Admitting: Internal Medicine

## 2018-10-01 ENCOUNTER — Other Ambulatory Visit: Payer: Self-pay | Admitting: Family Medicine

## 2018-10-15 ENCOUNTER — Other Ambulatory Visit: Payer: Self-pay | Admitting: Family Medicine

## 2018-10-28 ENCOUNTER — Telehealth (INDEPENDENT_AMBULATORY_CARE_PROVIDER_SITE_OTHER): Payer: BLUE CROSS/BLUE SHIELD | Admitting: Family Medicine

## 2018-10-28 ENCOUNTER — Other Ambulatory Visit: Payer: Self-pay

## 2018-10-28 ENCOUNTER — Encounter: Payer: Self-pay | Admitting: Family Medicine

## 2018-10-28 DIAGNOSIS — R202 Paresthesia of skin: Secondary | ICD-10-CM

## 2018-10-28 DIAGNOSIS — N183 Chronic kidney disease, stage 3 (moderate): Secondary | ICD-10-CM

## 2018-10-28 DIAGNOSIS — E785 Hyperlipidemia, unspecified: Secondary | ICD-10-CM

## 2018-10-28 DIAGNOSIS — E1159 Type 2 diabetes mellitus with other circulatory complications: Secondary | ICD-10-CM | POA: Diagnosis not present

## 2018-10-28 DIAGNOSIS — E1165 Type 2 diabetes mellitus with hyperglycemia: Secondary | ICD-10-CM

## 2018-10-28 DIAGNOSIS — IMO0002 Reserved for concepts with insufficient information to code with codable children: Secondary | ICD-10-CM

## 2018-10-28 DIAGNOSIS — Z794 Long term (current) use of insulin: Secondary | ICD-10-CM

## 2018-10-28 DIAGNOSIS — E1122 Type 2 diabetes mellitus with diabetic chronic kidney disease: Secondary | ICD-10-CM

## 2018-10-28 DIAGNOSIS — I10 Essential (primary) hypertension: Secondary | ICD-10-CM

## 2018-10-28 DIAGNOSIS — E1169 Type 2 diabetes mellitus with other specified complication: Secondary | ICD-10-CM

## 2018-10-28 NOTE — Progress Notes (Signed)
Virtual Visit via Video Note  I connected with Savannah Crawford  on 10/28/18 at  6:00 PM EDT by a video enabled telemedicine application and verified that I am speaking with the correct person using two identifiers.  Location patient: home Location provider:work or home office Persons participating in the virtual visit: patient, provider  I discussed the limitations of evaluation and management by telemedicine and the availability of in person appointments. The patient expressed understanding and agreed to proceed.   HPI:  Acute visit for L foot pain: -started 3 days ago -seems to be doing better today -she was having a zapping pain in that foot intermittently, dorsal lateral foor -denies any known injury, wounds, skin issues, swelling, fever, malaise, leg pain or pain or spasm  -denies pain here with weightbearing or pressing in the area of concern  Other history and concerns: She also reports a lump on the R leg that she has had for many months since getting cellulitis - it is painful at times. She feels the infection is gone. This lump is not draining, red or warm. She wants an in person visit for this but reports was told could not have an in person visit for 1 month, and could not come in for her flu shot until October. She wants to get her flu shot sooner. She has a TOC/CPE scheduled with Dr. Jerilee Hoh in October She has a PMH of uncontrolled diabetes (sees endo for management), obesity, HTN, HLD, diabetic neuropathy, anemia, CKD. Reports is taking all of her medications as instructed as I reviewed the medication list - but refills suggest otherwise.  She reports she has been improving her diet a lot and thinks her next check with her endocrinologist will be much improved.    ROS: See pertinent positives and negatives per HPI.  Past Medical History:  Diagnosis Date  . Anemia of chronic disease 11/17/2012  . At risk for heart disease - +stres test 2012 followed by [facility],  [place] with neg cardiac cath 03/2010 (non-obstructive CAD) per review of records, echo 02/2010 normal LVF 01/15/2012  . Chronic kidney disease (CKD), stage IV (severe) ([place]) 01/09/2012  . Diabetes mellitus with renal manifestations, uncontrolled ([place])   . Diabetic neuropathy ([place]) 01/09/2012  . Diastolic heart failure - stage 1 per review of echo results from 02/2010 01/15/2012  . Hyperlipidemia   . Hypertension   . Obesity   . Peripheral arterial disease? Eval by vascular 2013 with no LE PAD and advised to stop pletal   . Severe obesity (BMI >= 40) ([place]) 11/13/2012  . Toe ulcer, right ([place])   . Ulcer of toe of left foot ([place])    treated by [facility] per patient  . Venous stasis of lower extremity 03/10/2012    Past Surgical History:  Procedure Laterality Date  . EYE SURGERY    . TOE AMPUTATION  2008    Family History  Problem Relation Age of Onset  . Diabetes Mother   . Hyperlipidemia Mother   . Hypertension Mother   . Heart attack Mother   . Stroke Mother   . Diabetes Sister   . Hypertension Brother   . Diabetes Sister   . Diabetes Sister   . Breast cancer Sister   . Diabetes Sister   . Obesity Other   . Hypertension Other   . Hyperlipidemia Other   . Stroke Other   . Heart attack Other     SOCIAL HX: see hpi  Current Outpatient Medications:  .  atorvastatin (LIPITOR) 20 MG tablet, TAKE 1 TABLET BY MOUTH EVERY DAY, Disp: 30 tablet, Rfl: 0 .  Blood Glucose Monitoring Suppl (FREESTYLE LITE) DEVI, Use to check sugar 2 times daily, Disp: 1 each, Rfl: 0 .  ferrous sulfate (CVS IRON) 325 (65 FE) MG tablet, TAKE 1 TALBET BY MOUTH TWICE DAILY WITH A MEAL, Disp: 60 tablet, Rfl: 1 .  furosemide (LASIX) 40 MG tablet, TAKE 1 TABLET BY MOUTH TWICE A DAY, Disp: 180 tablet, Rfl: 0 .  glucose blood (FREESTYLE LITE) test strip, Use as instructed to check the sugar 3 times daily, Disp: 200 each, Rfl: 5 .  insulin aspart (NOVOLOG FLEXPEN) 100 UNIT/ML FlexPen, INJECT UP TO 90 UNITS  DAILY AS INSTRUCTED., Disp: 15 pen, Rfl: 3 .  Insulin Glargine (BASAGLAR KWIKPEN) 100 UNIT/ML SOPN, INJECT UP TO 80 UNITS A DAY AT BEDTIME, Disp: 72 pen, Rfl: 2 .  Insulin Pen Needle (B-D UF III MINI PEN NEEDLES) 31G X 5 MM MISC, USE AS DIRECTED - 5x a day., Disp: 300 each, Rfl: 5 .  lisinopril (ZESTRIL) 10 MG tablet, TAKE 1 TABLET BY MOUTH EVERY DAY, Disp: 30 tablet, Rfl: 0 .  metoprolol succinate (TOPROL-XL) 25 MG 24 hr tablet, TAKE 1 TABLET BY MOUTH EVERY DAY, Disp: 30 tablet, Rfl: 0  EXAM:  VITALS per patient if applicable:denies fever  GENERAL: alert, oriented, appears well and in no acute distress  HEENT: atraumatic, conjunttiva clear, no obvious abnormalities on inspection of external nose and ears  NECK: normal movements of the head and neck  LUNGS: on inspection no signs of respiratory distress, breathing rate appears normal, no obvious gross SOB, gasping or wheezing  SKIN/FOOT exam: poor lighting makes video visual inspection difficult - do not appreciate any gross swelling or lesion, she points to dorsal and lateral mid foot as area of the symptoms.  CV: no obvious cyanosis  MS: moves all visible extremities without noticeable abnormality  PSYCH/NEURO: pleasant and cooperative, no obvious depression or anxiety, speech and thought processing grossly intact  ASSESSMENT AND PLAN:  Discussed the following assessment and plan:  Uncontrolled type 2 diabetes mellitus with stage 3 chronic kidney disease, with long-term current use of insulin (HCC) - Plan: Hemoglobin A1c  Hyperlipidemia, unspecified hyperlipidemia type - Plan: Lipid panel  Hypertension associated with diabetes ([place]) - Plan: Basic metabolic panel, CBC  Hyperlipidemia associated with type 2 diabetes mellitus ([place])  Paresthesia/foot pain - Plan: Vitamin B12  -we discussed possible serious and likely etiologies, options for evaluation and workup, limitations of telemedicine visit vs in person visit, treatment,  treatment risks and precautions. Pt prefers to treat via telemedicine empirically today rather then risking or undertaking an in person visit at this moment. Patient agrees to seek prompt in person care if worsening, new symptoms arise, or if is not improving with treatment. She also agrees to close follow up in person next week and in person to eval the chronic leg lump and get the labs we ordered and a flu shot. Her description of the discomfort makes me think this is neurological. She has known neuropathy. Will check some labs. She feels is improving today. If labs unrevealing will need further evaluation and possible neurology referral if inperson exam also not revealing and persists.  I sent a message to admin staff to assist her to schedule an inperson visit in the next 1 week for follow up to ensure is improving/resoling and for eval of the R leg  lump. I placed lab orders to do that day. She also needs a PCP and reports has a visit in October with Dr. Jerilee Hoh. She wants her flu shot earlier and told her to ask when she come in. Advised she call our office to schedule this appt if she has not heard form our staff by tomorrow afternoon. She agrees. Advised of goals for diabetes and risks and advised she follow up with her endocrinologist, check home sugars and eat a healthy diet.   I discussed the assessment and treatment plan with the patient. The patient was provided an opportunity to ask questions and all were answered. The patient agreed with the plan and demonstrated an understanding of the instructions.    Lucretia Kern, DO

## 2018-10-28 NOTE — Patient Instructions (Signed)
Call tomorrow if our office has not reached out to you  - to schedule an in office visit in the next 5-7 days.  I ordered your labs to do that day.  I hope you are feeling better soon! Seek care sooner at the urgent care or elsewhere if your symptoms worsen or new concerns arise.

## 2018-10-30 ENCOUNTER — Telehealth: Payer: Self-pay | Admitting: Internal Medicine

## 2018-10-30 NOTE — Telephone Encounter (Signed)
MEDICATION: FreeStyle Meter  glucose blood (FREESTYLE LITE) test strip  PHARMACY:  [facility] Ph # [phone]  IS THIS A 90 DAY SUPPLY :   IS PATIENT OUT OF MEDICATION: Yes  IF NOT; HOW MUCH IS LEFT:   LAST APPOINTMENT DATE: @6 /15/2020  NEXT APPOINTMENT DATE:@10 /13/2020  DO WE HAVE YOUR PERMISSION TO LEAVE A DETAILED MESSAGE:  OTHER COMMENTS:    **Let patient know to contact pharmacy at the end of the day to make sure medication is ready. **  ** Please notify patient to allow 48-72 hours to process**  **Encourage patient to contact the pharmacy for refills or they can request refills through [facility]**

## 2018-10-31 MED ORDER — FREESTYLE LITE TEST VI STRP: ORAL_STRIP | 5 refills | Status: DC

## 2018-10-31 NOTE — Telephone Encounter (Signed)
RX sent

## 2018-11-03 ENCOUNTER — Other Ambulatory Visit: Payer: Self-pay

## 2018-11-03 ENCOUNTER — Encounter: Payer: Self-pay | Admitting: Family Medicine

## 2018-11-03 ENCOUNTER — Ambulatory Visit: Payer: BLUE CROSS/BLUE SHIELD | Admitting: Family Medicine

## 2018-11-03 VITALS — BP 140/78 | HR 65 | Temp 98.1°F | Resp 12 | Ht 71.0 in | Wt 307.6 lb

## 2018-11-03 DIAGNOSIS — N183 Chronic kidney disease, stage 3 (moderate): Secondary | ICD-10-CM

## 2018-11-03 DIAGNOSIS — E1122 Type 2 diabetes mellitus with diabetic chronic kidney disease: Secondary | ICD-10-CM | POA: Diagnosis not present

## 2018-11-03 DIAGNOSIS — E1159 Type 2 diabetes mellitus with other circulatory complications: Secondary | ICD-10-CM

## 2018-11-03 DIAGNOSIS — Z794 Long term (current) use of insulin: Secondary | ICD-10-CM

## 2018-11-03 DIAGNOSIS — R234 Changes in skin texture: Secondary | ICD-10-CM

## 2018-11-03 DIAGNOSIS — R238 Other skin changes: Secondary | ICD-10-CM

## 2018-11-03 DIAGNOSIS — I10 Essential (primary) hypertension: Secondary | ICD-10-CM

## 2018-11-03 DIAGNOSIS — E785 Hyperlipidemia, unspecified: Secondary | ICD-10-CM

## 2018-11-03 DIAGNOSIS — R202 Paresthesia of skin: Secondary | ICD-10-CM | POA: Diagnosis not present

## 2018-11-03 DIAGNOSIS — E1165 Type 2 diabetes mellitus with hyperglycemia: Secondary | ICD-10-CM

## 2018-11-03 DIAGNOSIS — IMO0002 Reserved for concepts with insufficient information to code with codable children: Secondary | ICD-10-CM

## 2018-11-03 DIAGNOSIS — Z23 Encounter for immunization: Secondary | ICD-10-CM | POA: Diagnosis not present

## 2018-11-03 DIAGNOSIS — I878 Other specified disorders of veins: Secondary | ICD-10-CM | POA: Diagnosis not present

## 2018-11-03 LAB — LIPID PANEL
Cholesterol: 138 mg/dL (ref 0–200)
HDL: 51.6 mg/dL (ref >39.00)
LDL Cholesterol: 66 mg/dL (ref 0–99)
NonHDL: 86.52
Total CHOL/HDL Ratio: 3
Triglycerides: 102 mg/dL (ref 0.0–149.0)
VLDL: 20.4 mg/dL (ref 0.0–40.0)

## 2018-11-03 LAB — CBC
HCT: 35.3 % — ABNORMAL LOW (ref 36.0–46.0)
Hemoglobin: 11.3 g/dL — ABNORMAL LOW (ref 12.0–15.0)
MCHC: 31.9 g/dL (ref 30.0–36.0)
MCV: 86.3 fl (ref 78.0–100.0)
Platelets: 243 10*3/uL (ref 150.0–400.0)
RBC: 4.09 Mil/uL (ref 3.87–5.11)
RDW: 14.7 % (ref 11.5–15.5)
WBC: 8.4 10*3/uL (ref 4.0–10.5)

## 2018-11-03 LAB — HEMOGLOBIN A1C: Hgb A1c MFr Bld: 9.4 % — ABNORMAL HIGH (ref 4.6–6.5)

## 2018-11-03 LAB — BASIC METABOLIC PANEL
BUN: 26 mg/dL — ABNORMAL HIGH (ref 6–23)
CO2: 25 mEq/L (ref 19–32)
Calcium: 8.9 mg/dL (ref 8.4–10.5)
Chloride: 104 mEq/L (ref 96–112)
Creatinine, Ser: 1.72 mg/dL — ABNORMAL HIGH (ref 0.40–1.20)
GFR: 36.24 mL/min — ABNORMAL LOW (ref >60.00)
Glucose, Bld: 133 mg/dL — ABNORMAL HIGH (ref 70–99)
Potassium: 4.7 mEq/L (ref 3.5–5.1)
Sodium: 139 mEq/L (ref 135–145)

## 2018-11-03 LAB — VITAMIN B12: Vitamin B-12: 516 pg/mL (ref 211–911)

## 2018-11-03 NOTE — Patient Instructions (Addendum)
A few things to remember from today's visit:   Postinflammatory skin changes  Venous stasis of lower extremity  Left leg paresthesias  Compression stockings may help to prevent further skin changes. Appropriate foot care.  Peripheral Neuropathy Peripheral neuropathy is a type of nerve damage. It affects nerves that carry signals between the spinal cord and the arms, legs, and the rest of the body (peripheral nerves). It does not affect nerves in the spinal cord or brain. In peripheral neuropathy, one nerve or a group of nerves may be damaged. Peripheral neuropathy is a broad category that includes many specific nerve disorders, like diabetic neuropathy, hereditary neuropathy, and carpal tunnel syndrome. What are the causes? This condition may be caused by:  Diabetes. This is the most common cause of peripheral neuropathy.  Nerve injury.  Pressure or stress on a nerve that lasts a long time.  Lack (deficiency) of B vitamins. This can result from alcoholism, poor diet, or a restricted diet.  Infections.  Autoimmune diseases, such as rheumatoid arthritis and systemic lupus erythematosus.  Nerve diseases that are passed from parent to child (inherited).  Some medicines, such as cancer medicines (chemotherapy).  Poisonous (toxic) substances, such as lead and mercury.  Too little blood flowing to the legs.  Kidney disease.  Thyroid disease. In some cases, the cause of this condition is not known. What are the signs or symptoms? Symptoms of this condition depend on which of your nerves is damaged. Common symptoms include:  Loss of feeling (numbness) in the feet, hands, or both.  Tingling in the feet, hands, or both.  Burning pain.  Very sensitive skin.  Weakness.  Not being able to move a part of the body (paralysis).  Muscle twitching.  Clumsiness or poor coordination.  Loss of balance.  Not being able to control your bladder.  Feeling dizzy.  Sexual  problems. How is this diagnosed? Diagnosing and finding the cause of peripheral neuropathy can be difficult. Your health care provider will take your medical history and do a physical exam. A neurological exam will also be done. This involves checking things that are affected by your brain, spinal cord, and nerves (nervous system). For example, your health care provider will check your reflexes, how you move, and what you can feel. You may have other tests, such as:  Blood tests.  Electromyogram (EMG) and nerve conduction tests. These tests check nerve function and how well the nerves are controlling the muscles.  Imaging tests, such as CT scans or MRI to rule out other causes of your symptoms.  Removing a small piece of nerve to be examined in a lab (nerve biopsy). This is rare.  Removing and examining a small amount of the fluid that surrounds the brain and spinal cord (lumbar puncture). This is rare. How is this treated? Treatment for this condition may involve:  Treating the underlying cause of the neuropathy, such as diabetes, kidney disease, or vitamin deficiencies.  Stopping medicines that can cause neuropathy, such as chemotherapy.  Medicine to relieve pain. Medicines may include: ? Prescription or over-the-counter pain medicine. ? Antiseizure medicine. ? Antidepressants. ? Pain-relieving patches that are applied to painful areas of skin.  Surgery to relieve pressure on a nerve or to destroy a nerve that is causing pain.  Physical therapy to help improve movement and balance.  Devices to help you move around (assistive devices). Follow these instructions at home: Medicines  Take over-the-counter and prescription medicines only as told by your health care provider.  Do not take any other medicines without first asking your health care provider.  Do not drive or use heavy machinery while taking prescription pain medicine. Lifestyle   Do not use any products that contain  nicotine or tobacco, such as cigarettes and e-cigarettes. Smoking keeps blood from reaching damaged nerves. If you need help quitting, ask your health care provider.  Avoid or limit alcohol. Too much alcohol can cause a vitamin B deficiency, and vitamin B is needed for healthy nerves.  Eat a healthy diet. This includes: ? Eating foods that are high in fiber, such as fresh fruits and vegetables, whole grains, and beans. ? Limiting foods that are high in fat and processed sugars, such as fried or sweet foods. General instructions   If you have diabetes, work closely with your health care provider to keep your blood sugar under control.  If you have numbness in your feet: ? Check every day for signs of injury or infection. Watch for redness, warmth, and swelling. ? Wear padded socks and comfortable shoes. These help protect your feet.  Develop a good support system. Living with peripheral neuropathy can be stressful. Consider talking with a mental health specialist or joining a support group.  Use assistive devices and attend physical therapy as told by your health care provider. This may include using a walker or a cane.  Keep all follow-up visits as told by your health care provider. This is important. Contact a health care provider if:  You have new signs or symptoms of peripheral neuropathy.  You are struggling emotionally from dealing with peripheral neuropathy.  Your pain is not well-controlled. Get help right away if:  You have an injury or infection that is not healing normally.  You develop new weakness in an arm or leg.  You fall frequently. Summary  Peripheral neuropathy is when the nerves in the arms, or legs are damaged, resulting in numbness, weakness, or pain.  There are many causes of peripheral neuropathy, including diabetes, pinched nerves, vitamin deficiencies, autoimmune disease, and hereditary conditions.  Diagnosing and finding the cause of peripheral  neuropathy can be difficult. Your health care provider will take your medical history, do a physical exam, and do tests, including blood tests and nerve function tests.  Treatment involves treating the underlying cause of the neuropathy and taking medicines to help control pain. Physical therapy and assistive devices may also help. This information is not intended to replace advice given to you by your health care provider. Make sure you discuss any questions you have with your health care provider. Document Released: 01/26/2002 Document Revised: 01/18/2017 Document Reviewed: 04/16/2016 Elsevier Patient Education  [place].  Please be sure medication list is accurate. If a new problem present, please set up appointment sooner than planned today.

## 2018-11-03 NOTE — Telephone Encounter (Signed)
Patient states that her meter was not sent and her test strips where sent to the incorrect pharmacy.   Send to: [facility] # [phone]  Please Advise, Thanks

## 2018-11-03 NOTE — Progress Notes (Signed)
ACUTE VISIT   HPI:  Chief Complaint  Patient presents with  . lumb in leg    Right    Savannah Crawford is a 63 y.o. female, who is here today complaining of "knot" in right leg, which she noted after she recovered from cellulitis in 01/2018. He has not changed in size. It is not tender or pruritic. She has not tried OTC treatments.  History of vein disease. She denies fever, chills, change in appetite, unusual fatigue, chest pain, dyspnea, palpitations, or a skin rash.  She is also complaining about left lower extremity "zap" sensation in posterior aspect of left thigh and left foot, she described it like electricity. Problem is intermittent, it lasts a few seconds at a time. 9/10 intensity. She has not identified exacerbating or alleviating factors. History of DM 2, she denies checking BS.  Lab Results  Component Value Date   HGBA1C 9.4 (H) 11/03/2018   She denies associated lower back pain or extremity weakness. Probably has been going on for a week and it has been a stable.  Review of Systems  Constitutional: Negative for activity change, appetite change, fatigue, fever and unexpected weight change.  HENT: Negative for mouth sores and nosebleeds.   Eyes: Negative for redness and visual disturbance.  Respiratory: Negative for cough and wheezing.   Cardiovascular: Positive for leg swelling.  Gastrointestinal: Negative for abdominal pain, nausea and vomiting.       Negative for changes in bowel habits.  Genitourinary: Negative for decreased urine volume, dysuria and hematuria.  Musculoskeletal: Negative for arthralgias and gait problem.  Neurological: Negative for syncope and headaches.  Rest see pertinent positives and negatives per HPI.   Current Outpatient Medications on File Prior to Visit  Medication Sig Dispense Refill  . atorvastatin (LIPITOR) 20 MG tablet TAKE 1 TABLET BY MOUTH EVERY DAY 30 tablet 0  . Blood Glucose Monitoring Suppl (FREESTYLE LITE)  DEVI Use to check sugar 2 times daily 1 each 0  . ferrous sulfate ([facility] IRON) 325 (65 FE) MG tablet TAKE 1 TALBET BY MOUTH TWICE DAILY WITH A MEAL 60 tablet 1  . furosemide (LASIX) 40 MG tablet TAKE 1 TABLET BY MOUTH TWICE A DAY 180 tablet 0  . glucose blood (FREESTYLE LITE) test strip Use as instructed to check the sugar 3 times daily 200 each 5  . insulin aspart (NOVOLOG FLEXPEN) 100 UNIT/ML FlexPen INJECT UP TO 90 UNITS DAILY AS INSTRUCTED. 15 pen 3  . Insulin Glargine (BASAGLAR KWIKPEN) 100 UNIT/ML SOPN INJECT UP TO 80 UNITS A DAY AT BEDTIME 72 pen 2  . Insulin Pen Needle (B-D UF III MINI PEN NEEDLES) 31G X 5 MM MISC USE AS DIRECTED - 5x a day. 300 each 5  . lisinopril (ZESTRIL) 10 MG tablet TAKE 1 TABLET BY MOUTH EVERY DAY 30 tablet 0  . metoprolol succinate (TOPROL-XL) 25 MG 24 hr tablet TAKE 1 TABLET BY MOUTH EVERY DAY 30 tablet 0   No current facility-administered medications on file prior to visit.      Past Medical History:  Diagnosis Date  . Anemia of chronic disease 11/17/2012  . At risk for heart disease - +stres test 2012 followed by [facility], [place] with neg cardiac cath 03/2010 (non-obstructive CAD) per review of records, echo 02/2010 normal LVF 01/15/2012  . Chronic kidney disease (CKD), stage IV (severe) ([place]) 01/09/2012  . Diabetes mellitus with renal manifestations, uncontrolled ([place])   . Diabetic neuropathy ([place]) 01/09/2012  .  Diastolic heart failure - stage 1 per review of echo results from 02/2010 01/15/2012  . Hyperlipidemia   . Hypertension   . Obesity   . Peripheral arterial disease? Eval by vascular 2013 with no LE PAD and advised to stop pletal   . Severe obesity (BMI >= 40) ([place]) 11/13/2012  . Toe ulcer, right ([place])   . Ulcer of toe of left foot ([place])    treated by [facility] per patient  . Venous stasis of lower extremity 03/10/2012   No Known Allergies  Social History   Socioeconomic History  . Marital status: Married    Spouse name: Not on  file  . Number of children: Not on file  . Years of education: Not on file  . Highest education level: Not on file  Occupational History  . Not on file  Social Needs  . Financial resource strain: Not on file  . Food insecurity    Worry: Not on file    Inability: Not on file  . Transportation needs    Medical: Not on file    Non-medical: Not on file  Tobacco Use  . Smoking status: Former Smoker    Types: Cigarettes    Quit date: 02/20/1999    Years since quitting: 19.7  . Smokeless tobacco: Never Used  Substance and Sexual Activity  . Alcohol use: Yes  . Drug use: Yes  . Sexual activity: Not on file  Lifestyle  . Physical activity    Days per week: Not on file    Minutes per session: Not on file  . Stress: Not on file  Relationships  . Social Herbalist on phone: Not on file    Gets together: Not on file    Attends religious service: Not on file    Active member of club or organization: Not on file    Attends meetings of clubs or organizations: Not on file    Relationship status: Not on file  Other Topics Concern  . Not on file  Social History Narrative  . Not on file    Vitals:   11/03/18 0936  BP: 140/78  Pulse: 65  Resp: 12  Temp: 98.1 F (36.7 C)  SpO2: 98%   Body mass index is 42.9 kg/m.  Physical Exam  Nursing note and vitals reviewed. Constitutional: She is oriented to person, place, and time. She appears well-developed. No distress.  HENT:  Head: Normocephalic and atraumatic.  Mouth/Throat: Mucous membranes are normal.  Eyes: Conjunctivae are normal.  Cardiovascular: Normal rate and regular rhythm.  No murmur heard. Pulses:      Dorsalis pedis pulses are 2+ on the right side and 2+ on the left side.  Respiratory: Effort normal and breath sounds normal. No respiratory distress.  GI: Soft. She exhibits no mass. There is no hepatomegaly. There is no abdominal tenderness.  Musculoskeletal:        General: Edema (Trace pitting LE  edema,bilateral.) present.  Neurological: She is alert and oriented to person, place, and time. She has normal strength. No cranial nerve deficit. Gait normal.  Skin: Skin is warm. No rash noted. No erythema.     I do not appreciate masses/nodules on area of concern. There is skin thickness and hyperpigmentation (about 6-7 cm x 3-4 cm).  Area is not tender or erythematous.  Psychiatric: She has a normal mood and affect.  Well groomed, good eye contact.    ASSESSMENT AND PLAN:  Ms. Berenice was seen  today for lumb in leg.  Diagnoses and all orders for this visit:  Postinflammatory skin changes I did not appreciate any mass in right lower extremity, postinflammatory changes most likely related with vein disease. Adequate skin care. Instructed about warning signs.  Venous stasis of lower extremity Recommend compression stockings. Lower extremity elevation a few times during the day. Currently she is on furosemide 40 mg twice daily, no changes.  Left leg paresthesias We discussed possible etiologies. Most likely problem is related with poorly controlled DM.  Stressed the importance of better glucose control. Appropriate skin care.  For now she will interested in pharmacologic treatment. Continue monitoring for changes.  Need for immunization against influenza -     Flu Vaccine QUAD 6+ mos PF IM (Fluarix Quad PF)  Return if symptoms worsen or fail to improve.  -Ms.Seham Jerelene Crawford was advised to seek immediate medical attention if sudden worsening symptoms or to follow if they persist or if new concerns arise.    Anadelia Kintz G. [place], MD  [facility]. [place] office.

## 2018-11-04 ENCOUNTER — Other Ambulatory Visit: Payer: Self-pay

## 2018-11-04 MED ORDER — FREESTYLE LITE TEST VI STRP: ORAL_STRIP | 5 refills | Status: DC

## 2018-11-04 MED ORDER — FREESTYLE LITE DEVI: 0 refills | Status: DC

## 2018-11-04 NOTE — Telephone Encounter (Signed)
Patient has called again to the message below. She has not received any of her medications.  Please Advise, Thanks

## 2018-11-04 NOTE — Telephone Encounter (Signed)
I spoke with patient and informed her that I have sent the meter and test strips to correct pharmacy.

## 2018-11-26 ENCOUNTER — Encounter: Payer: BLUE CROSS/BLUE SHIELD | Admitting: Internal Medicine

## 2018-11-28 ENCOUNTER — Other Ambulatory Visit: Payer: Self-pay

## 2018-11-28 ENCOUNTER — Other Ambulatory Visit: Payer: Self-pay | Admitting: Family Medicine

## 2018-12-02 ENCOUNTER — Encounter

## 2018-12-02 ENCOUNTER — Other Ambulatory Visit: Payer: Self-pay

## 2018-12-02 ENCOUNTER — Ambulatory Visit: Payer: BLUE CROSS/BLUE SHIELD | Admitting: Internal Medicine

## 2018-12-02 ENCOUNTER — Encounter: Payer: Self-pay | Admitting: Internal Medicine

## 2018-12-02 VITALS — BP 120/70 | HR 64 | Ht 71.0 in | Wt 312.0 lb

## 2018-12-02 DIAGNOSIS — Z794 Long term (current) use of insulin: Secondary | ICD-10-CM

## 2018-12-02 DIAGNOSIS — E1165 Type 2 diabetes mellitus with hyperglycemia: Secondary | ICD-10-CM | POA: Diagnosis not present

## 2018-12-02 DIAGNOSIS — IMO0002 Reserved for concepts with insufficient information to code with codable children: Secondary | ICD-10-CM

## 2018-12-02 DIAGNOSIS — E1122 Type 2 diabetes mellitus with diabetic chronic kidney disease: Secondary | ICD-10-CM | POA: Diagnosis not present

## 2018-12-02 DIAGNOSIS — E1169 Type 2 diabetes mellitus with other specified complication: Secondary | ICD-10-CM

## 2018-12-02 DIAGNOSIS — N183 Chronic kidney disease, stage 3 unspecified: Secondary | ICD-10-CM

## 2018-12-02 DIAGNOSIS — E1142 Type 2 diabetes mellitus with diabetic polyneuropathy: Secondary | ICD-10-CM

## 2018-12-02 DIAGNOSIS — E785 Hyperlipidemia, unspecified: Secondary | ICD-10-CM

## 2018-12-02 MED ORDER — FREESTYLE LITE TEST VI STRP: ORAL_STRIP | 5 refills | Status: DC

## 2018-12-02 MED ORDER — OZEMPIC (0.25 OR 0.5 MG/DOSE) 2 MG/1.5ML ~~LOC~~ SOPN: 0.5000 mg | PEN_INJECTOR | SUBCUTANEOUS | 5 refills | Status: DC

## 2018-12-02 NOTE — Progress Notes (Signed)
Subjective:     Patient ID: Savannah Crawford, female   DOB: Jan 21, 1956, 63 y.o.   MRN: [id number]  HPI [name] is a 63 y.o. woman returning for f/u for DM2, insulin-dependent, uncontrolled, with complications (CAD, CKD, diabetes neuropathy, diabetic retinopathy, PVD, amputated toe-R). Last visit 7 months ago.  Last hemoglobin A1c was higher: Lab Results  Component Value Date   HGBA1C 9.4 (H) 11/03/2018   HGBA1C 9.2 (A) 05/06/2018   HGBA1C 8.1 (A) 12/30/2017   She is on: - Basaglar 45 units in a.m. and 15-20 units at night >> she changed by herself to: 40 units in am and 30-40 units - Novolog dose: 20-25 units before meals, but 15-20 units before lunch >> she changed by herself to: 28-x-28 units - Novolog Sliding scale: - 150- 165: + 1 unit  - 166- 180: + 2 units  - 181- 195: + 3 units  - 196- 210: + 4 units  - >210: + 5 units  She is still missing many insulin doses!  She was on metformin in the past, but stopped 2/2 CKD.  Tried Victoza before >> cannot remember SE.  She was checking her sugars 1-3 times a day: - am 98-114 >> 67, 81-154, 182 >> 88-180, 217 >> 192, 220 - after b'fast: 128-183 >> n/c - prelunch:185 >> 170-180 >> n/c - after lunch: 236 - predinner: 176-180s >> 247 >> n/c > 79, 122-166, 204 - bedtime: 294 >> n/c >> 135-217, 366 >> n/c Lowest: 74 >> 98 >> 70 (took insulin and did not eat much) >> 68 overnight. Highest sugar 215>> 200s >> 250.  Meter: Freestyle Lite  + CKD-she sees nephrology:  Lab Results  Component Value Date   BUN 26 (H) 11/03/2018   CREATININE 1.72 (H) 11/03/2018  On lisinopril 10.  + HL; Latest lipids: Lab Results  Component Value Date   CHOL 138 11/03/2018   HDL 51.60 11/03/2018   LDLCALC 66 11/03/2018   TRIG 102.0 11/03/2018   CHOLHDL 3 11/03/2018  On Lamictal 20.  Last eye exam 02/2017: + DR  She has numbness and tingling in feet, stable.  She sees podiatry (Savannah Crawford).  She has a history of left third toe ulcer.  She also has  a h/o nonobstructive CAD, with positive stress test but a negative cardiac cath 2012, and also has diastolic heart failure. She has HTN, venous stasis, vitamin D deficiency.  She continues to see podiatry.  She had skin grafts for toe ulcer.  No family history of medullary thyroid cancer and no personal history of pancreatitis.  Review of Systems Constitutional: + Weight gain/no weight loss, no fatigue, no subjective hyperthermia, no subjective hypothermia Eyes: no blurry vision, no xerophthalmia ENT: no sore throat, no nodules palpated in neck, no dysphagia, no odynophagia, no hoarseness Cardiovascular: no CP/no SOB/no palpitations/+ leg swelling Respiratory: no cough/no SOB/no wheezing Gastrointestinal: no N/no V/no D/no C/no acid reflux Musculoskeletal: no muscle aches/no joint aches Skin: no rashes, no hair loss Neurological: no tremors/+ numbness/+ tingling/no dizziness  I reviewed pt's medications, allergies, PMH, social hx, family hx, and changes were documented in the history of present illness. Otherwise, unchanged from my initial visit note.  Past Medical History:  Diagnosis Date  . Anemia of chronic disease 11/17/2012  . At risk for heart disease - +stres test 2012 followed by [facility], [place] with neg cardiac cath 03/2010 (non-obstructive CAD) per review of records, echo 02/2010 normal LVF 01/15/2012  . Chronic kidney  disease (CKD), stage IV (severe) ([place]) 01/09/2012  . Diabetes mellitus with renal manifestations, uncontrolled ([place])   . Diabetic neuropathy ([place]) 01/09/2012  . Diastolic heart failure - stage 1 per review of echo results from 02/2010 01/15/2012  . Hyperlipidemia   . Hypertension   . Obesity   . Peripheral arterial disease? Eval by vascular 2013 with no LE PAD and advised to stop pletal   . Severe obesity (BMI >= 40) ([place]) 11/13/2012  . Toe ulcer, right ([place])   . Ulcer of toe of left foot ([place])    treated by [facility] per patient  . Venous stasis  of lower extremity 03/10/2012   Past Surgical History:  Procedure Laterality Date  . EYE SURGERY    . TOE AMPUTATION  2008   Social History   Socioeconomic History  . Marital status: Married    Spouse name: Not on file  . Number of children: Not on file  . Years of education: Not on file  . Highest education level: Not on file  Occupational History  . Not on file  Social Needs  . Financial resource strain: Not on file  . Food insecurity    Worry: Not on file    Inability: Not on file  . Transportation needs    Medical: Not on file    Non-medical: Not on file  Tobacco Use  . Smoking status: Former Smoker    Types: Cigarettes    Quit date: 02/20/1999    Years since quitting: 19.7  . Smokeless tobacco: Never Used  Substance and Sexual Activity  . Alcohol use: Yes  . Drug use: Yes  . Sexual activity: Not on file  Lifestyle  . Physical activity    Days per week: Not on file    Minutes per session: Not on file  . Stress: Not on file  Relationships  . Social Herbalist on phone: Not on file    Gets together: Not on file    Attends religious service: Not on file    Active member of club or organization: Not on file    Attends meetings of clubs or organizations: Not on file    Relationship status: Not on file  . Intimate partner violence    Fear of current or ex partner: Not on file    Emotionally abused: Not on file    Physically abused: Not on file    Forced sexual activity: Not on file  Other Topics Concern  . Not on file  Social History Narrative  . Not on file   Current Outpatient Medications on File Prior to Visit  Medication Sig Dispense Refill  . atorvastatin (LIPITOR) 20 MG tablet TAKE 1 TABLET BY MOUTH EVERY DAY 30 tablet 0  . Blood Glucose Monitoring Suppl (FREESTYLE LITE) DEVI Use to check sugar 2 times daily 1 each 0  . ferrous sulfate ([employer] IRON) 325 (65 FE) MG tablet TAKE 1 TALBET BY MOUTH TWICE DAILY WITH A MEAL 60 tablet 1  . furosemide  (LASIX) 40 MG tablet TAKE 1 TABLET BY MOUTH TWICE A DAY 180 tablet 0  . glucose blood (FREESTYLE LITE) test strip Use as instructed to check the sugar 3 times daily 200 each 5  . insulin aspart (NOVOLOG FLEXPEN) 100 UNIT/ML FlexPen INJECT UP TO 90 UNITS DAILY AS INSTRUCTED. 15 pen 3  . Insulin Glargine (BASAGLAR KWIKPEN) 100 UNIT/ML SOPN INJECT UP TO 80 UNITS A DAY AT BEDTIME 72 pen 2  .  Insulin Pen Needle (B-D UF III MINI PEN NEEDLES) 31G X 5 MM MISC USE AS DIRECTED - 5x a day. 300 each 5  . lisinopril (ZESTRIL) 10 MG tablet TAKE 1 TABLET BY MOUTH EVERY DAY 30 tablet 0  . metoprolol succinate (TOPROL-XL) 25 MG 24 hr tablet TAKE 1 TABLET BY MOUTH EVERY DAY 30 tablet 0   No current facility-administered medications on file prior to visit.    No Known Allergies Family History  Problem Relation Age of Onset  . Diabetes Mother   . Hyperlipidemia Mother   . Hypertension Mother   . Heart attack Mother   . Stroke Mother   . Diabetes Sister   . Hypertension Brother   . Diabetes Sister   . Diabetes Sister   . Breast cancer Sister   . Diabetes Sister   . Obesity Other   . Hypertension Other   . Hyperlipidemia Other   . Stroke Other   . Heart attack Other    Objective:   Physical Exam BP 120/70   Pulse 64   Ht 5\' 11"  (1.803 m)   Wt (!) 312 lb (141.5 kg)   SpO2 98%   BMI 43.52 kg/m  Body mass index is 42.9 kg/m. Wt Readings from Last 3 Encounters:  12/02/18 (!) 312 lb (141.5 kg)  11/03/18 (!) 307 lb 9.6 oz (139.5 kg)  05/06/18 300 lb (136.1 kg)   Constitutional: overweight, in NAD Eyes: PERRLA, EOMI, no exophthalmos ENT: moist mucous membranes, no thyromegaly, no cervical lymphadenopathy Cardiovascular: RRR, No MRG, + bilateral lower extremity edema-R>L - wears compression hoses Respiratory: CTA B Gastrointestinal: abdomen soft, NT, ND, BS+ Musculoskeletal: no deformities, strength intact in all 4 Skin: moist, warm, no rashes Neurological: no tremor with outstretched hands,  DTR normal in all 4  Assessment:     1. DM2, insulin-dependent, uncontrolled, with complications - nonobstructive CAD - CKD - diabetes neuropathy - diabetic retinopathy - PVD - R amputated toe  2. PN  3. Obesity class 3    Plan:     1. Pt with history of uncontrolled diabetes, on basal-bolus insulin regimen only, due to her CKD.  She refused a GLP-1 receptor agonist at previous visits.  At last visit, HbA1c was higher, at 9.2% and she had another HbA1c last month which was even slightly higher, at 9.4%.  At last visit, we did not change her regimen but she was missing NovoLog doses and I strongly encouraged her not to do so anymore.  She was also not checking sugars at all in the preceding month and we discussed about the importance of checking sugars 3 times a day since she is on a complex insulin regimen. -At this visit, she is not checking sugars consistently, has changed the insulin doses since last visit, and is still missing doses.  We discussed that we cannot control her diabetes unless she takes the medication consistently.  At this visit, however, I also suggested again a GLP-1 receptor agonist.  Discussed about benefits and possible side effects.  She reluctantly agrees to try this-given coupon card. -Since we are starting this, will reduce the doses of NovoLog and also, since she is dropping her sugars too much at night, will decrease the Basaglar dose in the evening to the one recommended at last visit. -  I advised her to: Patient Instructions  Please change: - Basaglar 40 units in a.m. and 20 units at night  - Novolog dose: 20-25 units before meals - Novolog Sliding  scale: - 150- 165: + 1 unit  - 166- 180: + 2 units  - 181- 195: + 3 units  - 196- 210: + 4 units  - >210: + 5 units  Take NovoLog 15 minutes before every meal.  Please start Ozempic 0.25 mg weekly in a.m. (for example on Sunday morning) x 4 weeks, then increase to 0.5 mg weekly in a.m. if no nausea or  hypoglycemia.  Try to check sugars 3 times a day.  Please return in 3-4 months with your sugar log.    - advised to check sugars at different times of the day - 3x a day, rotating check times - advised for yearly eye exams >> she is not UTD - UTD with flu shot - return to clinic in 3-4 months    2. PN -Due to diabetes -+ Occasional foot pain at night, which is unchanged -Continues to see podiatry  3. HL -Reviewed latest lipid panel from last month: All fractions at goal Lab Results  Component Value Date   CHOL 138 11/03/2018   HDL 51.60 11/03/2018   LDLCALC 66 11/03/2018   TRIG 102.0 11/03/2018   CHOLHDL 3 11/03/2018  -Continues Lipitor without side effects.  Philemon Kingdom, MD PhD [facility]

## 2018-12-02 NOTE — Patient Instructions (Addendum)
Please change: - Basaglar 40 units in a.m. and 20 units at night  - Novolog dose: 20-25 units before meals - Novolog Sliding scale: - 150- 165: + 1 unit  - 166- 180: + 2 units  - 181- 195: + 3 units  - 196- 210: + 4 units  - >210: + 5 units  Take NovoLog 15 minutes before every meal.  Please start Ozempic 0.25 mg weekly in a.m. (for example on Sunday morning) x 4 weeks, then increase to 0.5 mg weekly in a.m. if no nausea or hypoglycemia.  Try to check sugars 3 times a day.  Please return in 3-4 months with your sugar log.

## 2018-12-02 NOTE — Progress Notes (Signed)
This patient needs a PCP. Please see if she wishes to continue with [facility] and if so, please assist. Please let her know I am happy to see her when she desires virtual care if she ends up sticking with [facility]. Thanks!

## 2018-12-04 ENCOUNTER — Ambulatory Visit: Payer: BLUE CROSS/BLUE SHIELD

## 2018-12-22 ENCOUNTER — Other Ambulatory Visit: Payer: Self-pay | Admitting: Family Medicine

## 2018-12-26 ENCOUNTER — Other Ambulatory Visit: Payer: Self-pay | Admitting: Family Medicine

## 2019-01-02 ENCOUNTER — Encounter: Payer: Self-pay | Admitting: Internal Medicine

## 2019-01-02 ENCOUNTER — Other Ambulatory Visit: Payer: Self-pay

## 2019-01-02 ENCOUNTER — Ambulatory Visit: Payer: BLUE CROSS/BLUE SHIELD | Admitting: Internal Medicine

## 2019-01-02 VITALS — BP 124/78 | HR 71 | Temp 97.7°F | Ht 70.5 in | Wt 311.4 lb

## 2019-01-02 DIAGNOSIS — IMO0002 Reserved for concepts with insufficient information to code with codable children: Secondary | ICD-10-CM

## 2019-01-02 DIAGNOSIS — I5032 Chronic diastolic (congestive) heart failure: Secondary | ICD-10-CM | POA: Diagnosis not present

## 2019-01-02 DIAGNOSIS — E1169 Type 2 diabetes mellitus with other specified complication: Secondary | ICD-10-CM

## 2019-01-02 DIAGNOSIS — Z23 Encounter for immunization: Secondary | ICD-10-CM | POA: Diagnosis not present

## 2019-01-02 DIAGNOSIS — E1122 Type 2 diabetes mellitus with diabetic chronic kidney disease: Secondary | ICD-10-CM | POA: Diagnosis not present

## 2019-01-02 DIAGNOSIS — I10 Essential (primary) hypertension: Secondary | ICD-10-CM

## 2019-01-02 DIAGNOSIS — E1165 Type 2 diabetes mellitus with hyperglycemia: Secondary | ICD-10-CM

## 2019-01-02 DIAGNOSIS — E1159 Type 2 diabetes mellitus with other circulatory complications: Secondary | ICD-10-CM

## 2019-01-02 DIAGNOSIS — Z794 Long term (current) use of insulin: Secondary | ICD-10-CM

## 2019-01-02 DIAGNOSIS — N183 Chronic kidney disease, stage 3 unspecified: Secondary | ICD-10-CM

## 2019-01-02 DIAGNOSIS — E785 Hyperlipidemia, unspecified: Secondary | ICD-10-CM

## 2019-01-02 NOTE — Addendum Note (Signed)
Addended by: Westley Hummer B on: 01/02/2019 12:05 PM   Modules accepted: Orders

## 2019-01-02 NOTE — Patient Instructions (Signed)
-  Nice seeing you today!!  -Referral to medical weight management has been requested.  -First shingles vaccine today.  -Schedule follow up in 3 months.

## 2019-01-02 NOTE — Progress Notes (Signed)
Established Patient Office Visit     CC/Reason for Visit: Establish care, follow-up chronic medical conditions  HPI: Savannah Crawford is a 63 y.o. female who is coming in today for the above mentioned reasons.  Past medical history is extensive and significant for insulin-dependent diabetes has not been well controlled with an A1c of 9.4 in September 2020, followed by Dr. Cruzita Lederer, chronic kidney disease stage III with a baseline creatinine around 1.72 followed by nephrologist out of [facility], hyperlipidemia well controlled on Lipitor, morbid obesity with a BMI of about 44, well-controlled hypertension, peripheral arterial disease status post right second toe amputation, heart failure with preserved ejection fraction that is well compensated without a recent echocardiogram on file.  She is married, has 2 grown children, works in Therapist, art.  She was a smoker but quit over 30 years ago, drinks alcohol occasionally, has no known drug allergies, her family history is significant for a mother who is deceased who had diabetes, hypertension, MI, CVA, 3 sisters are deceased one from Covid earlier this year and the other 2 were also vasculopaths.  She has no acute complaints today.   Past Medical/Surgical History: Past Medical History:  Diagnosis Date  . Anemia of chronic disease 11/17/2012  . At risk for heart disease - +stres test 2012 followed by [facility], [place] with neg cardiac cath 03/2010 (non-obstructive CAD) per review of records, echo 02/2010 normal LVF 01/15/2012  . Chronic kidney disease (CKD), stage IV (severe) ([place]) 01/09/2012  . Diabetes mellitus with renal manifestations, uncontrolled ([place])   . Diabetic neuropathy ([place]) 01/09/2012  . Diastolic heart failure - stage 1 per review of echo results from 02/2010 01/15/2012  . Hyperlipidemia   . Hypertension   . Obesity   . Peripheral arterial disease? Eval by vascular 2013 with no LE PAD and advised to stop pletal   .  Severe obesity (BMI >= 40) ([place]) 11/13/2012  . Toe ulcer, right ([place])   . Ulcer of toe of left foot ([place])    treated by [facility] per patient  . Venous stasis of lower extremity 03/10/2012    Past Surgical History:  Procedure Laterality Date  . EYE SURGERY    . TOE AMPUTATION  2008    Social History:  reports that she quit smoking about 19 years ago. Her smoking use included cigarettes. She has never used smokeless tobacco. She reports current alcohol use. She reports current drug use.  Allergies: No Known Allergies  Family History:  Family History  Problem Relation Age of Onset  . Diabetes Mother   . Hyperlipidemia Mother   . Hypertension Mother   . Heart attack Mother   . Stroke Mother   . Diabetes Sister   . Hypertension Brother   . Diabetes Sister   . Diabetes Sister   . Breast cancer Sister   . Diabetes Sister   . Obesity Other   . Hypertension Other   . Hyperlipidemia Other   . Stroke Other   . Heart attack Other      Current Outpatient Medications:  .  atorvastatin (LIPITOR) 20 MG tablet, TAKE 1 TABLET BY MOUTH EVERY DAY, Disp: 30 tablet, Rfl: 0 .  Blood Glucose Monitoring Suppl (FREESTYLE LITE) DEVI, Use to check sugar 2 times daily, Disp: 1 each, Rfl: 0 .  ferrous sulfate (CVS IRON) 325 (65 FE) MG tablet, TAKE 1 TALBET BY MOUTH TWICE DAILY WITH A MEAL, Disp: 60 tablet, Rfl: 1 .  furosemide (  LASIX) 40 MG tablet, TAKE 1 TABLET BY MOUTH TWICE A DAY, Disp: 180 tablet, Rfl: 0 .  glucose blood (FREESTYLE LITE) test strip, Use as instructed to check the sugar 3 times daily, Disp: 200 each, Rfl: 5 .  insulin aspart (NOVOLOG FLEXPEN) 100 UNIT/ML FlexPen, INJECT UP TO 90 UNITS DAILY AS INSTRUCTED., Disp: 15 pen, Rfl: 3 .  Insulin Glargine (BASAGLAR KWIKPEN) 100 UNIT/ML SOPN, INJECT UP TO 80 UNITS A DAY AT BEDTIME, Disp: 72 pen, Rfl: 2 .  Insulin Pen Needle (B-D UF III MINI PEN NEEDLES) 31G X 5 MM MISC, USE AS DIRECTED - 5x a day., Disp: 300 each, Rfl: 5 .  lisinopril  (ZESTRIL) 10 MG tablet, TAKE 1 TABLET BY MOUTH EVERY DAY, Disp: 30 tablet, Rfl: 0 .  metoprolol succinate (TOPROL-XL) 25 MG 24 hr tablet, TAKE 1 TABLET BY MOUTH EVERY DAY, Disp: 30 tablet, Rfl: 0 .  Semaglutide,0.25 or 0.5MG /DOS, (OZEMPIC, 0.25 OR 0.5 MG/DOSE,) 2 MG/1.5ML SOPN, Inject 0.5 mg into the skin once a week., Disp: 2 pen, Rfl: 5  Review of Systems:  Constitutional: Denies fever, chills, diaphoresis, appetite change and fatigue.  HEENT: Denies photophobia, eye pain, redness, hearing loss, ear pain, congestion, sore throat, rhinorrhea, sneezing, mouth sores, trouble swallowing, neck pain, neck stiffness and tinnitus.   Respiratory: Denies SOB, DOE, cough, chest tightness,  and wheezing.   Cardiovascular: Denies chest pain, palpitations and leg swelling.  Gastrointestinal: Denies nausea, vomiting, abdominal pain, diarrhea, constipation, blood in stool and abdominal distention.  Genitourinary: Denies dysuria, urgency, frequency, hematuria, flank pain and difficulty urinating.  Endocrine: Denies: hot or cold intolerance, sweats, changes in hair or nails, polyuria, polydipsia. Musculoskeletal: Denies myalgias, back pain, joint swelling, arthralgias and gait problem.  Skin: Denies pallor, rash and wound.  Neurological: Denies dizziness, seizures, syncope, weakness, light-headedness, numbness and headaches.  Hematological: Denies adenopathy. Easy bruising, personal or family bleeding history  Psychiatric/Behavioral: Denies suicidal ideation, mood changes, confusion, nervousness, sleep disturbance and agitation    Physical Exam: Vitals:   01/02/19 0812  BP: 124/78  Pulse: 71  Temp: 97.7 F (36.5 C)  TempSrc: Temporal  SpO2: 97%  Weight: (!) 311 lb 6.4 oz (141.3 kg)  Height: 5' 10.5" (1.791 m)    Body mass index is 44.05 kg/m.   Constitutional: NAD, calm, comfortable Eyes: PERRL, lids and conjunctivae normal ENMT: Mucous membranes are moist.  Respiratory: clear to auscultation  bilaterally, no wheezing, no crackles. Normal respiratory effort. No accessory muscle use.  Cardiovascular: Regular rate and rhythm, no murmurs / rubs / gallops.  2+ pitting extremity edema bilaterally. 2+ pedal pulses. No carotid bruits.  Abdomen: no tenderness, no masses palpated. No hepatosplenomegaly. Bowel sounds positive.  Musculoskeletal: no clubbing / cyanosis. No joint deformity upper and lower extremities. Good ROM, no contractures. Normal muscle tone.  Skin: no rashes, lesions, ulcers. No induration Neurologic: CN 2-12 grossly intact. Sensation intact, DTR normal. Strength 5/5 in all 4.  Psychiatric: Normal judgment and insight. Alert and oriented x 3. Normal mood.    Impression and Plan:  Hypertension associated with diabetes ([place]) -Blood pressure appears to be well controlled today on lisinopril, metoprolol, furosemide.  Chronic diastolic heart failure (HCC) -No recent echocardiogram on file. -She is on ACE inhibitor, beta-blocker, statin, should consider adding aspirin next visit. -She does have significant lower extremity edema but no shortness of breath. -Would also benefit from reordering echo and can consider this at next follow-up.  Hyperlipidemia associated with type 2 diabetes mellitus (HCC) -LDL  is at goal at 75 as of September 2020.  Uncontrolled type 2 diabetes mellitus with stage 3 chronic kidney disease, with long-term current use of insulin ([place]) -Remains uncontrolled with recent A1c of 9.4.  She follows with endocrinology. -She was started on Ozempic at her last visit with endocrinology a month ago but patient admits to not starting, she also does not check CBGs routinely and frequently misses doses of NovoLog. -Have spent a significant amount of time today counseling on long-term outcomes of uncontrolled diabetes of which she already has heart failure and chronic kidney disease.  She will try to make better efforts.  Morbid obesity ([place]) -Discussed healthy  lifestyle, including increased physical activity and better food choices to promote weight loss. -She has agreed to referral to weight and wellness today.   Patient Instructions  -Nice seeing you today!!  -Referral to medical weight management has been requested.  -First shingles vaccine today.  -Schedule follow up in 3 months.     Lelon Frohlich, MD [facility]

## 2019-01-14 ENCOUNTER — Other Ambulatory Visit: Payer: Self-pay | Admitting: Family Medicine

## 2019-01-26 ENCOUNTER — Other Ambulatory Visit: Payer: Self-pay | Admitting: Family Medicine

## 2019-02-20 ENCOUNTER — Other Ambulatory Visit: Payer: Self-pay | Admitting: Internal Medicine

## 2019-03-17 LAB — HM DIABETES EYE EXAM

## 2019-04-01 ENCOUNTER — Other Ambulatory Visit: Payer: Self-pay

## 2019-04-02 ENCOUNTER — Encounter: Payer: Self-pay | Admitting: Internal Medicine

## 2019-04-02 ENCOUNTER — Ambulatory Visit (INDEPENDENT_AMBULATORY_CARE_PROVIDER_SITE_OTHER): Payer: BLUE CROSS/BLUE SHIELD | Admitting: Internal Medicine

## 2019-04-02 VITALS — BP 130/80 | HR 75 | Temp 97.3°F | Wt 311.3 lb

## 2019-04-02 DIAGNOSIS — Z794 Long term (current) use of insulin: Secondary | ICD-10-CM | POA: Diagnosis not present

## 2019-04-02 DIAGNOSIS — E1165 Type 2 diabetes mellitus with hyperglycemia: Secondary | ICD-10-CM | POA: Diagnosis not present

## 2019-04-02 DIAGNOSIS — E1169 Type 2 diabetes mellitus with other specified complication: Secondary | ICD-10-CM

## 2019-04-02 DIAGNOSIS — E1122 Type 2 diabetes mellitus with diabetic chronic kidney disease: Secondary | ICD-10-CM

## 2019-04-02 DIAGNOSIS — E1159 Type 2 diabetes mellitus with other circulatory complications: Secondary | ICD-10-CM | POA: Diagnosis not present

## 2019-04-02 DIAGNOSIS — N183 Chronic kidney disease, stage 3 unspecified: Secondary | ICD-10-CM | POA: Diagnosis not present

## 2019-04-02 DIAGNOSIS — E785 Hyperlipidemia, unspecified: Secondary | ICD-10-CM

## 2019-04-02 DIAGNOSIS — Z23 Encounter for immunization: Secondary | ICD-10-CM

## 2019-04-02 DIAGNOSIS — I5032 Chronic diastolic (congestive) heart failure: Secondary | ICD-10-CM

## 2019-04-02 DIAGNOSIS — I10 Essential (primary) hypertension: Secondary | ICD-10-CM

## 2019-04-02 DIAGNOSIS — N184 Chronic kidney disease, stage 4 (severe): Secondary | ICD-10-CM

## 2019-04-02 DIAGNOSIS — IMO0002 Reserved for concepts with insufficient information to code with codable children: Secondary | ICD-10-CM

## 2019-04-02 LAB — POCT GLYCOSYLATED HEMOGLOBIN (HGB A1C): Hemoglobin A1C: 8.5 % — AB (ref 4.0–5.6)

## 2019-04-02 MED ORDER — BASAGLAR KWIKPEN 100 UNIT/ML ~~LOC~~ SOPN: PEN_INJECTOR | SUBCUTANEOUS | 2 refills | Status: DC

## 2019-04-02 MED ORDER — ATORVASTATIN CALCIUM 20 MG PO TABS: 20.0000 mg | ORAL_TABLET | Freq: Every day | ORAL | 1 refills | Status: DC

## 2019-04-02 MED ORDER — LISINOPRIL 10 MG PO TABS: 10.0000 mg | ORAL_TABLET | Freq: Every day | ORAL | 3 refills | Status: DC

## 2019-04-02 MED ORDER — NOVOLOG FLEXPEN 100 UNIT/ML ~~LOC~~ SOPN: PEN_INJECTOR | SUBCUTANEOUS | 3 refills | Status: DC

## 2019-04-02 MED ORDER — FUROSEMIDE 40 MG PO TABS: 40.0000 mg | ORAL_TABLET | Freq: Two times a day (BID) | ORAL | 1 refills | Status: DC

## 2019-04-02 MED ORDER — OZEMPIC (0.25 OR 0.5 MG/DOSE) 2 MG/1.5ML ~~LOC~~ SOPN: 0.5000 mg | PEN_INJECTOR | SUBCUTANEOUS | 5 refills | Status: DC

## 2019-04-02 MED ORDER — METOPROLOL SUCCINATE ER 25 MG PO TB24: 25.0000 mg | ORAL_TABLET | Freq: Every day | ORAL | 3 refills | Status: DC

## 2019-04-02 NOTE — Patient Instructions (Addendum)
-  Nice seeing you today!!  -Make sure you follow up with Dr. Cruzita Lederer is regards to your diabetes.  -Will send you for an echocardiogram.  -See you back in 4 months.

## 2019-04-02 NOTE — Progress Notes (Signed)
Established Patient Office Visit     This visit occurred during the SARS-CoV-2 public health emergency.  Safety protocols were in place, including screening questions prior to the visit, additional usage of staff PPE, and extensive cleaning of exam room while observing appropriate contact time as indicated for disinfecting solutions.    CC/Reason for Visit: Follow-up chronic conditions  HPI: Savannah Crawford is a 64 y.o. female who is coming in today for the above mentioned reasons. Past Medical History is significant for: for insulin-dependent diabetes has not been well controlled with an A1c of 9.4 in September 2020, followed by Dr. Cruzita Lederer, chronic kidney disease stage III with a baseline creatinine around 1.72 followed by nephrologist out of [facility], hyperlipidemia well controlled on Lipitor, morbid obesity with a BMI of about 44, well-controlled hypertension, peripheral arterial disease status post right second toe amputation, heart failure with preserved ejection fraction that is well compensated without a recent echocardiogram on file.  She tells me she did not take bolus insulin as advised by endocrinology as she thought that there were some changes in diet that she could make beforehand.  She has been feeling well otherwise.  She still is wearing a right orthopedic shoe after her toe amputation, she tells me that her podiatrist saw her this week and told her that the lesion is completely healed at this point.  She has no acute complaints today.   Past Medical/Surgical History: Past Medical History:  Diagnosis Date  . Anemia of chronic disease 11/17/2012  . At risk for heart disease - +stres test 2012 followed by [facility], [place] with neg cardiac cath 03/2010 (non-obstructive CAD) per review of records, echo 02/2010 normal LVF 01/15/2012  . Chronic kidney disease (CKD), stage IV (severe) ([place]) 01/09/2012  . Diabetes mellitus with renal manifestations, uncontrolled ([place])   .  Diabetic neuropathy ([place]) 01/09/2012  . Diastolic heart failure - stage 1 per review of echo results from 02/2010 01/15/2012  . Hyperlipidemia   . Hypertension   . Obesity   . Peripheral arterial disease? Eval by vascular 2013 with no LE PAD and advised to stop pletal   . Severe obesity (BMI >= 40) ([place]) 11/13/2012  . Toe ulcer, right ([place])   . Ulcer of toe of left foot ([place])    treated by [facility] per patient  . Venous stasis of lower extremity 03/10/2012    Past Surgical History:  Procedure Laterality Date  . EYE SURGERY    . TOE AMPUTATION  2008    Social History:  reports that she quit smoking about 20 years ago. Her smoking use included cigarettes. She has never used smokeless tobacco. She reports current alcohol use. She reports current drug use.  Allergies: No Known Allergies  Family History:  Family History  Problem Relation Age of Onset  . Diabetes Mother   . Hyperlipidemia Mother   . Hypertension Mother   . Heart attack Mother   . Stroke Mother   . Diabetes Sister   . Hypertension Brother   . Diabetes Sister   . Diabetes Sister   . Breast cancer Sister   . Diabetes Sister   . Obesity Other   . Hypertension Other   . Hyperlipidemia Other   . Stroke Other   . Heart attack Other      Current Outpatient Medications:  .  atorvastatin (LIPITOR) 20 MG tablet, Take 1 tablet (20 mg total) by mouth daily., Disp: 90 tablet, Rfl: 1 .  Blood Glucose Monitoring Suppl (FREESTYLE LITE) DEVI, Use to check sugar 2 times daily, Disp: 1 each, Rfl: 0 .  ferrous sulfate (CVS IRON) 325 (65 FE) MG tablet, TAKE 1 TALBET BY MOUTH TWICE DAILY WITH A MEAL, Disp: 60 tablet, Rfl: 1 .  furosemide (LASIX) 40 MG tablet, Take 1 tablet (40 mg total) by mouth 2 (two) times daily., Disp: 180 tablet, Rfl: 1 .  glucose blood (FREESTYLE LITE) test strip, Use as instructed to check the sugar 3 times daily, Disp: 200 each, Rfl: 5 .  insulin aspart (NOVOLOG FLEXPEN) 100 UNIT/ML FlexPen, INJECT  UP TO 90 UNITS DAILY AS INSTRUCTED., Disp: 15 pen, Rfl: 3 .  Insulin Glargine (BASAGLAR KWIKPEN) 100 UNIT/ML SOPN, 40 units q am and 20 units qpm, Disp: 72 pen, Rfl: 2 .  Insulin Pen Needle (B-D UF III MINI PEN NEEDLES) 31G X 5 MM MISC, USE AS DIRECTED - 5x a day., Disp: 300 each, Rfl: 5 .  lisinopril (ZESTRIL) 10 MG tablet, Take 1 tablet (10 mg total) by mouth daily., Disp: 30 tablet, Rfl: 3 .  metoprolol succinate (TOPROL-XL) 25 MG 24 hr tablet, Take 1 tablet (25 mg total) by mouth daily., Disp: 30 tablet, Rfl: 3 .  Semaglutide,0.25 or 0.5MG /DOS, (OZEMPIC, 0.25 OR 0.5 MG/DOSE,) 2 MG/1.5ML SOPN, Inject 0.5 mg into the skin once a week., Disp: 2 pen, Rfl: 5  Review of Systems:  Constitutional: Denies fever, chills, diaphoresis, appetite change and fatigue.  HEENT: Denies photophobia, eye pain, redness, hearing loss, ear pain, congestion, sore throat, rhinorrhea, sneezing, mouth sores, trouble swallowing, neck pain, neck stiffness and tinnitus.   Respiratory: Denies SOB, DOE, cough, chest tightness,  and wheezing.   Cardiovascular: Denies chest pain, palpitations. Gastrointestinal: Denies nausea, vomiting, abdominal pain, diarrhea, constipation, blood in stool and abdominal distention.  Genitourinary: Denies dysuria, urgency, frequency, hematuria, flank pain and difficulty urinating.  Endocrine: Denies: hot or cold intolerance, sweats, changes in hair or nails, polyuria, polydipsia. Musculoskeletal: Denies myalgias, back pain, joint swelling, arthralgias and gait problem.  Skin: Denies pallor, rash and wound.  Neurological: Denies dizziness, seizures, syncope, weakness, light-headedness, numbness and headaches.  Hematological: Denies adenopathy. Easy bruising, personal or family bleeding history  Psychiatric/Behavioral: Denies suicidal ideation, mood changes, confusion, nervousness, sleep disturbance and agitation    Physical Exam: Vitals:   04/02/19 1305  BP: 130/80  Pulse: 75  Temp: (!)  97.3 F (36.3 C)  TempSrc: Temporal  SpO2: 97%  Weight: (!) 311 lb 4.8 oz (141.2 kg)    Body mass index is 44.04 kg/m.   Constitutional: NAD, calm, comfortable Eyes: PERRL, lids and conjunctivae normal ENMT: Mucous membranes are moist.  Respiratory: clear to auscultation bilaterally, no wheezing, no crackles. Normal respiratory effort. No accessory muscle use.  Cardiovascular: Regular rate and rhythm, no murmurs / rubs / gallops.  2+ pitting lower extremity edema. 2+ pedal pulses. No carotid bruits.  Neurologic: Grossly intact and nonfocal Psychiatric: Normal judgment and insight. Alert and oriented x 3. Normal mood.    Impression and Plan:  Uncontrolled type 2 diabetes mellitus with stage 3 chronic kidney disease, with long-term current use of insulin (HCC) -Followed by endocrine, she is requesting her insulins to be refilled today.  Hyperlipidemia associated with type 2 diabetes mellitus ([place]) - Morbid obesity ([place])  Hypertension associated with diabetes ([place]) - Plan: lisinopril (ZESTRIL) 10 MG tablet, metoprolol succinate (TOPROL-XL) 25 MG 24 hr tablet  Hyperlipidemia, unspecified hyperlipidemia type -At goal, last LDL was 66.  Chronic diastolic heart  failure ([place]) - -due to her persistent lower extremity edema, will reorder 2D echo to check ejection fraction and wall motion as well as valvular function.  Chronic kidney disease (CKD), stage IV (severe) (HCC) -Followed by nephrology at [facility]    Patient Instructions  -Nice seeing you today!!  -Make sure you follow up with Dr. Cruzita Lederer is regards to your diabetes.  -Will send you for an echocardiogram.     Lelon Frohlich, MD [facility]

## 2019-04-07 ENCOUNTER — Encounter: Payer: Self-pay | Admitting: Internal Medicine

## 2019-04-07 ENCOUNTER — Ambulatory Visit: Payer: BLUE CROSS/BLUE SHIELD | Admitting: Internal Medicine

## 2019-04-07 ENCOUNTER — Other Ambulatory Visit: Payer: Self-pay

## 2019-04-07 VITALS — BP 120/60 | HR 70 | Ht 70.5 in | Wt 314.0 lb

## 2019-04-07 DIAGNOSIS — E1142 Type 2 diabetes mellitus with diabetic polyneuropathy: Secondary | ICD-10-CM

## 2019-04-07 DIAGNOSIS — E1122 Type 2 diabetes mellitus with diabetic chronic kidney disease: Secondary | ICD-10-CM

## 2019-04-07 DIAGNOSIS — E1165 Type 2 diabetes mellitus with hyperglycemia: Secondary | ICD-10-CM | POA: Diagnosis not present

## 2019-04-07 DIAGNOSIS — N183 Chronic kidney disease, stage 3 unspecified: Secondary | ICD-10-CM

## 2019-04-07 DIAGNOSIS — Z794 Long term (current) use of insulin: Secondary | ICD-10-CM

## 2019-04-07 DIAGNOSIS — IMO0002 Reserved for concepts with insufficient information to code with codable children: Secondary | ICD-10-CM

## 2019-04-07 MED ORDER — OZEMPIC (0.25 OR 0.5 MG/DOSE) 2 MG/1.5ML ~~LOC~~ SOPN: 0.5000 mg | PEN_INJECTOR | SUBCUTANEOUS | 5 refills | Status: DC

## 2019-04-07 NOTE — Patient Instructions (Addendum)
Please continue: - Basaglar 40 units in a.m. and 20 units at night - Novolog dose: 20-25 units before b'fast and dinner, and 10 units before lunch - Novolog Sliding scale: - 150- 165: + 1 unit  - 166- 180: + 2 units  - 181- 195: + 3 units  - 196- 210: + 4 units  - >210: + 5 units  Take NovoLog 15 minutes before every meal.  Please start Ozempic 0.25 mg weekly in a.m. (for example on Sunday morning) x 4 weeks, then increase to 0.5 mg weekly in a.m. if no nausea or hypoglycemia.  Try to check sugars 3 times a day.  Please return in 3-4 months with your sugar log.

## 2019-04-07 NOTE — Progress Notes (Signed)
Subjective:     Patient ID: Savannah Crawford, female   DOB: [birth year], 64 y.o.   MRN: [id number]  HPI [name] is a 64 y.o. woman returning for f/u for DM2, insulin-dependent, uncontrolled, with complications (CAD, CKD, diabetes neuropathy, diabetic retinopathy, PVD, amputated toe-R). Last visit 4 months ago.  Patient's diabetes control is hindered by not checking sugars frequently now and not taking her medications as prescribed.  HbA1c levels reviewed: Lab Results  Component Value Date   HGBA1C 8.5 (A) 04/02/2019   HGBA1C 9.4 (H) 11/03/2018   HGBA1C 9.2 (A) 05/06/2018   She is on: - Basaglar 45 units in a.m. and 15-20 units at night >> she changed by herself to: 40 units in am and 30-40 units >> 40 units in the a.m. and 20-30 units at night - Novolog dose: 20-25 units before meals, but 15-20 units before lunch >> she changed by herself to: 28-x-28 units >>  25 units before meals, But she only takes it 2x a day, not 3 times a day as advised - Novolog Sliding scale: - 150- 165: + 1 unit  - 166- 180: + 2 units  - 181- 195: + 3 units  - 196- 210: + 4 units  - >210: + 5 units  I suggested Ozempic 0.5 mg weekly 11/2018 >> did not start as she did not want to add another med... She was on metformin in the past, but stopped 2/2 CKD.  Tried Victoza before >> cannot remember SE.  She was checking her sugars 1-3 times a day: - am: 67, 81-154, 182 >> 88-180, 217 >> 192, 220 >> 98, 112-178 - after b'fast: 128-183 >> n/c - prelunch:185 >> 170-180 >> n/c - after lunch: 236 - predinner: 176-180s >> 247 >> n/c > 79, 122-166, 204 >> 230-240 - bedtime: 294 >> n/c >> 135-217, 366 >> n/c Lowest: 68 >> 98 Highest sugar 250 >> 240.  Meter: Freestyle Lite  + CKD-sees nephrology Lab Results  Component Value Date   BUN 26 (H) 11/03/2018   CREATININE 1.72 (H) 11/03/2018  On lisinopril 10.  + HL; Latest lipids: Lab Results  Component Value Date   CHOL 138 11/03/2018   HDL 51.60 11/03/2018    LDLCALC 66 11/03/2018   TRIG 102.0 11/03/2018   CHOLHDL 3 11/03/2018  On Lipitor 20.  Last eye exam 02/2019: + DR, +cataract reportedly  She has stable numbness and tingling in her feet.  She sees podiatry (Dr. Melony Overly).  She has a history of left third toe ulcer  She also has a h/o nonobstructive CAD, with positive stress test but a negative cardiac cath 2012, and also has diastolic heart failure. She has HTN, venous stasis, vitamin D deficiency.  She continues to see podiatry.  She has a history of skin grafts for toe ulcer.  No family history of medullary thyroid cancer and no personal history of pancreatitis.  Review of Systems Constitutional: no weight gain/no weight loss, no fatigue, no subjective hyperthermia, no subjective hypothermia Eyes: no blurry vision, no xerophthalmia ENT: no sore throat, no nodules palpated in neck, no dysphagia, no odynophagia, no hoarseness Cardiovascular: no CP/no SOB/no palpitations/no leg swelling Respiratory: no cough/no SOB/no wheezing Gastrointestinal: no N/no V/no D/no C/no acid reflux Musculoskeletal: no muscle aches/no joint aches Skin: no rashes, no hair loss Neurological: no tremors/+ numbness/+ tingling/no dizziness  I reviewed pt's medications, allergies, PMH, social hx, family hx, and changes were documented in the history of present illness. Otherwise, unchanged from my  initial visit note.  Past Medical History:  Diagnosis Date  . Anemia of chronic disease 11/17/2012  . At risk for heart disease - +stres test 2012 followed by [facility], [place] with neg cardiac cath 03/2010 (non-obstructive CAD) per review of records, echo 02/2010 normal LVF 01/15/2012  . Chronic kidney disease (CKD), stage IV (severe) ([place]) 01/09/2012  . Diabetes mellitus with renal manifestations, uncontrolled ([place])   . Diabetic neuropathy ([place]) 01/09/2012  . Diastolic heart failure - stage 1 per review of echo results from 02/2010 01/15/2012  . Hyperlipidemia    . Hypertension   . Obesity   . Peripheral arterial disease? Eval by vascular 2013 with no LE PAD and advised to stop pletal   . Severe obesity (BMI >= 40) ([place]) 11/13/2012  . Toe ulcer, right ([place])   . Ulcer of toe of left foot ([place])    treated by [facility] per patient  . Venous stasis of lower extremity 03/10/2012   Past Surgical History:  Procedure Laterality Date  . EYE SURGERY    . TOE AMPUTATION  2008   Social History   Socioeconomic History  . Marital status: Married    Spouse name: Not on file  . Number of children: Not on file  . Years of education: Not on file  . Highest education level: Not on file  Occupational History  . Not on file  Tobacco Use  . Smoking status: Former Smoker    Types: Cigarettes    Quit date: 02/20/1999    Years since quitting: 20.1  . Smokeless tobacco: Never Used  Substance and Sexual Activity  . Alcohol use: Yes  . Drug use: Yes  . Sexual activity: Not on file  Other Topics Concern  . Not on file  Social History Narrative  . Not on file   Social Determinants of Health   Financial Resource Strain:   . Difficulty of Paying Living Expenses: Not on file  Food Insecurity:   . Worried About Charity fundraiser in the Last Year: Not on file  . Ran Out of Food in the Last Year: Not on file  Transportation Needs:   . Lack of Transportation (Medical): Not on file  . Lack of Transportation (Non-Medical): Not on file  Physical Activity:   . Days of Exercise per Week: Not on file  . Minutes of Exercise per Session: Not on file  Stress:   . Feeling of Stress : Not on file  Social Connections:   . Frequency of Communication with Friends and Family: Not on file  . Frequency of Social Gatherings with Friends and Family: Not on file  . Attends Religious Services: Not on file  . Active Member of Clubs or Organizations: Not on file  . Attends Archivist Meetings: Not on file  . Marital Status: Not on file  Intimate Partner  Violence:   . Fear of Current or Ex-Partner: Not on file  . Emotionally Abused: Not on file  . Physically Abused: Not on file  . Sexually Abused: Not on file   Current Outpatient Medications on File Prior to Visit  Medication Sig Dispense Refill  . atorvastatin (LIPITOR) 20 MG tablet Take 1 tablet (20 mg total) by mouth daily. 90 tablet 1  . Blood Glucose Monitoring Suppl (FREESTYLE LITE) DEVI Use to check sugar 2 times daily 1 each 0  . ferrous sulfate (CVS IRON) 325 (65 FE) MG tablet TAKE 1 TALBET BY MOUTH TWICE DAILY  WITH A MEAL 60 tablet 1  . furosemide (LASIX) 40 MG tablet Take 1 tablet (40 mg total) by mouth 2 (two) times daily. 180 tablet 1  . glucose blood (FREESTYLE LITE) test strip Use as instructed to check the sugar 3 times daily 200 each 5  . insulin aspart (NOVOLOG FLEXPEN) 100 UNIT/ML FlexPen INJECT UP TO 90 UNITS DAILY AS INSTRUCTED. 15 pen 3  . Insulin Glargine (BASAGLAR KWIKPEN) 100 UNIT/ML SOPN 40 units q am and 20 units qpm 72 pen 2  . Insulin Pen Needle (B-D UF III MINI PEN NEEDLES) 31G X 5 MM MISC USE AS DIRECTED - 5x a day. 300 each 5  . lisinopril (ZESTRIL) 10 MG tablet Take 1 tablet (10 mg total) by mouth daily. 30 tablet 3  . metoprolol succinate (TOPROL-XL) 25 MG 24 hr tablet Take 1 tablet (25 mg total) by mouth daily. 30 tablet 3  . Semaglutide,0.25 or 0.5MG /DOS, (OZEMPIC, 0.25 OR 0.5 MG/DOSE,) 2 MG/1.5ML SOPN Inject 0.5 mg into the skin once a week. 2 pen 5   No current facility-administered medications on file prior to visit.   No Known Allergies Family History  Problem Relation Age of Onset  . Diabetes Mother   . Hyperlipidemia Mother   . Hypertension Mother   . Heart attack Mother   . Stroke Mother   . Diabetes Sister   . Hypertension Brother   . Diabetes Sister   . Diabetes Sister   . Breast cancer Sister   . Diabetes Sister   . Obesity Other   . Hypertension Other   . Hyperlipidemia Other   . Stroke Other   . Heart attack Other    Objective:    Physical Exam BP 120/60   Pulse 70   Ht 5' 10.5" (1.791 m)   Wt (!) 314 lb (142.4 kg)   SpO2 97%   BMI 44.42 kg/m  Body mass index is 44.42 kg/m. Wt Readings from Last 3 Encounters:  04/07/19 (!) 314 lb (142.4 kg)  04/02/19 (!) 311 lb 4.8 oz (141.2 kg)  01/02/19 (!) 311 lb 6.4 oz (141.3 kg)   Constitutional: overweight, in NAD Eyes: PERRLA, EOMI, no exophthalmos ENT: moist mucous membranes, no thyromegaly, no cervical lymphadenopathy Cardiovascular: RRR, No MRG, + bilateral LE edema R>L-wears compression hoses Respiratory: CTA B Gastrointestinal: abdomen soft, NT, ND, BS+ Musculoskeletal: no deformities, strength intact in all 4 Skin: moist, warm, no rashes Neurological: no tremor with outstretched hands, DTR normal in all 4  Assessment:     1. DM2, insulin-dependent, uncontrolled, with complications - nonobstructive CAD - CKD - diabetes neuropathy - diabetic retinopathy - PVD - R amputated toe  2. PN  3. Obesity class 3    Plan:     1. Pt with history of uncontrolled diabetes, on basal/bolus insulin regimen due to her CKD.  In the past, she refused a GLP-1 receptor agonist but she accepted this at last visit.  At that time, we also reduced the dose of NovoLog and the Basaglar dose at night while starting Ozempic.  At that time, HbA1c was still high at 9.4%. -She recently had another HbA1c earlier this month which has improved to 8.5% -In the past, she was missing NovoLog doses and we discussed repeatedly about the importance of taking this before each meal and also checking sugars at least 3 times a day. -At this visit, she is only taking NovoLog twice a day and skips lunch.  As a consequence, sugars before dinner  are in the 200s.  I again advised her to add 1 dose of NovoLog before lunch, but lower than the other 2 doses to avoid hypoglycemia.  She also tells me that she did not start Ozempic as she did not want to add another medication to her regimen.  At this visit  I explained about the many benefits of Ozempic and the fact that is taken only once a week.  She has it at home and she agrees to try it. -  I advised her to: Patient Instructions  Please continue: - Basaglar 40 units in a.m. and 20 units at night - Novolog dose: 20-25 units before meals - Novolog Sliding scale: - 150- 165: + 1 unit  - 166- 180: + 2 units  - 181- 195: + 3 units  - 196- 210: + 4 units  - >210: + 5 units  Take NovoLog 15 minutes before every meal.  Please start Ozempic 0.25 mg weekly in a.m. (for example on Sunday morning) x 4 weeks, then increase to 0.5 mg weekly in a.m. if no nausea or hypoglycemia.  Try to check sugars 3 times a day.  Please return in 3-4 months with your sugar log.    - advised to check sugars at different times of the day - 3x a day, rotating check times - advised for yearly eye exams >> she is UTD - return to clinic in 3-4 months    2. PN -Related to diabetes -Continues to see podiatry -She has occasional foot pain at night, chronic  3. HL -Reviewed latest lipid panel from 10/2018: Fractions at goal: Lab Results  Component Value Date   CHOL 138 11/03/2018   HDL 51.60 11/03/2018   LDLCALC 66 11/03/2018   TRIG 102.0 11/03/2018   CHOLHDL 3 11/03/2018  -Continues Lipitor without side effects  Philemon Kingdom, MD PhD [facility] Endocrinology

## 2019-07-15 ENCOUNTER — Ambulatory Visit: Payer: BLUE CROSS/BLUE SHIELD | Admitting: Internal Medicine

## 2019-07-31 ENCOUNTER — Ambulatory Visit: Payer: BLUE CROSS/BLUE SHIELD | Admitting: Internal Medicine

## 2019-08-03 ENCOUNTER — Other Ambulatory Visit: Payer: Self-pay

## 2019-08-04 ENCOUNTER — Encounter: Payer: Self-pay | Admitting: Internal Medicine

## 2019-08-04 ENCOUNTER — Ambulatory Visit (INDEPENDENT_AMBULATORY_CARE_PROVIDER_SITE_OTHER): Payer: BLUE CROSS/BLUE SHIELD | Admitting: Internal Medicine

## 2019-08-04 VITALS — BP 140/74 | HR 73 | Temp 97.7°F | Wt 308.8 lb

## 2019-08-04 DIAGNOSIS — N183 Chronic kidney disease, stage 3 unspecified: Secondary | ICD-10-CM

## 2019-08-04 DIAGNOSIS — N184 Chronic kidney disease, stage 4 (severe): Secondary | ICD-10-CM

## 2019-08-04 DIAGNOSIS — IMO0002 Reserved for concepts with insufficient information to code with codable children: Secondary | ICD-10-CM

## 2019-08-04 DIAGNOSIS — E1165 Type 2 diabetes mellitus with hyperglycemia: Secondary | ICD-10-CM

## 2019-08-04 DIAGNOSIS — E1122 Type 2 diabetes mellitus with diabetic chronic kidney disease: Secondary | ICD-10-CM

## 2019-08-04 DIAGNOSIS — E1169 Type 2 diabetes mellitus with other specified complication: Secondary | ICD-10-CM

## 2019-08-04 DIAGNOSIS — I878 Other specified disorders of veins: Secondary | ICD-10-CM | POA: Diagnosis not present

## 2019-08-04 DIAGNOSIS — Z794 Long term (current) use of insulin: Secondary | ICD-10-CM

## 2019-08-04 DIAGNOSIS — I5032 Chronic diastolic (congestive) heart failure: Secondary | ICD-10-CM | POA: Diagnosis not present

## 2019-08-04 DIAGNOSIS — E1159 Type 2 diabetes mellitus with other circulatory complications: Secondary | ICD-10-CM

## 2019-08-04 DIAGNOSIS — E785 Hyperlipidemia, unspecified: Secondary | ICD-10-CM

## 2019-08-04 DIAGNOSIS — I10 Essential (primary) hypertension: Secondary | ICD-10-CM

## 2019-08-04 NOTE — Progress Notes (Signed)
Established Patient Office Visit     This visit occurred during the SARS-CoV-2 public health emergency.  Safety protocols were in place, including screening questions prior to the visit, additional usage of staff PPE, and extensive cleaning of exam room while observing appropriate contact time as indicated for disinfecting solutions.    CC/Reason for Visit: Follow-up chronic conditions  HPI: Savannah Crawford is a 64 y.o. female who is coming in today for the above mentioned reasons. Past Medical History is significant for: insulin-dependent diabetes has not been well controlled followed by Dr. Garner Nash kidney disease stage III with a baseline creatinine around 1.72 followed by nephrologist out of [facility], hyperlipidemia well controlled on Lipitor, morbid obesity with a BMI of about 44, well-controlled hypertension, peripheral arterial disease status post right second toe amputation, heart failure with preserved ejection fraction that is well compensated without a recent echocardiogram on file.   She has no acute complaints today.  She states she was never called to schedule the echocardiogram that was ordered at her last visit.  She has follow-up with endocrinology in the next couple weeks, states she has been taking her insulin as prescribed.  States she had an eye exam earlier this year but I do not have this on file.  She has received both of her Covid vaccines since we last spoke.   Past Medical/Surgical History: Past Medical History:  Diagnosis Date  . Anemia of chronic disease 11/17/2012  . At risk for heart disease - +stres test 2012 followed by [facility], [place] with neg cardiac cath 03/2010 (non-obstructive CAD) per review of records, echo 02/2010 normal LVF 01/15/2012  . Chronic kidney disease (CKD), stage IV (severe) ([place]) 01/09/2012  . Diabetes mellitus with renal manifestations, uncontrolled ([place])   . Diabetic neuropathy ([place]) 01/09/2012  . Diastolic heart  failure - stage 1 per review of echo results from 02/2010 01/15/2012  . Hyperlipidemia   . Hypertension   . Obesity   . Peripheral arterial disease? Eval by vascular 2013 with no LE PAD and advised to stop pletal   . Severe obesity (BMI >= 40) ([place]) 11/13/2012  . Toe ulcer, right ([place])   . Ulcer of toe of left foot ([place])    treated by [facility] per patient  . Venous stasis of lower extremity 03/10/2012    Past Surgical History:  Procedure Laterality Date  . EYE SURGERY    . TOE AMPUTATION  2008    Social History:  reports that she quit smoking about 20 years ago. Her smoking use included cigarettes. She has never used smokeless tobacco. She reports current alcohol use. She reports current drug use.  Allergies: No Known Allergies  Family History:  Family History  Problem Relation Age of Onset  . Diabetes Mother   . Hyperlipidemia Mother   . Hypertension Mother   . Heart attack Mother   . Stroke Mother   . Diabetes Sister   . Hypertension Brother   . Diabetes Sister   . Diabetes Sister   . Breast cancer Sister   . Diabetes Sister   . Obesity Other   . Hypertension Other   . Hyperlipidemia Other   . Stroke Other   . Heart attack Other      Current Outpatient Medications:  .  atorvastatin (LIPITOR) 20 MG tablet, Take 1 tablet (20 mg total) by mouth daily., Disp: 90 tablet, Rfl: 1 .  Blood Glucose Monitoring Suppl (FREESTYLE LITE) DEVI, Use to check  sugar 2 times daily, Disp: 1 each, Rfl: 0 .  ferrous sulfate (CVS IRON) 325 (65 FE) MG tablet, TAKE 1 TALBET BY MOUTH TWICE DAILY WITH A MEAL, Disp: 60 tablet, Rfl: 1 .  furosemide (LASIX) 40 MG tablet, Take 1 tablet (40 mg total) by mouth 2 (two) times daily., Disp: 180 tablet, Rfl: 1 .  glucose blood (FREESTYLE LITE) test strip, Use as instructed to check the sugar 3 times daily, Disp: 200 each, Rfl: 5 .  insulin aspart (NOVOLOG FLEXPEN) 100 UNIT/ML FlexPen, INJECT UP TO 90 UNITS DAILY AS INSTRUCTED., Disp: 15 pen, Rfl:  3 .  Insulin Glargine (BASAGLAR KWIKPEN) 100 UNIT/ML SOPN, 40 units q am and 20 units qpm, Disp: 72 pen, Rfl: 2 .  Insulin Pen Needle (B-D UF III MINI PEN NEEDLES) 31G X 5 MM MISC, USE AS DIRECTED - 5x a day., Disp: 300 each, Rfl: 5 .  lisinopril (ZESTRIL) 10 MG tablet, Take 1 tablet (10 mg total) by mouth daily., Disp: 30 tablet, Rfl: 3 .  metoprolol succinate (TOPROL-XL) 25 MG 24 hr tablet, Take 1 tablet (25 mg total) by mouth daily., Disp: 30 tablet, Rfl: 3 .  Semaglutide,0.25 or 0.5MG /DOS, (OZEMPIC, 0.25 OR 0.5 MG/DOSE,) 2 MG/1.5ML SOPN, Inject 0.5 mg into the skin once a week., Disp: 2 pen, Rfl: 5  Review of Systems:  Constitutional: Denies fever, chills, diaphoresis, appetite change and fatigue.  HEENT: Denies photophobia, eye pain, redness, hearing loss, ear pain, congestion, sore throat, rhinorrhea, sneezing, mouth sores, trouble swallowing, neck pain, neck stiffness and tinnitus.   Respiratory: Denies SOB, DOE, cough, chest tightness,  and wheezing.   Cardiovascular: Denies chest pain, palpitations and leg swelling.  Gastrointestinal: Denies nausea, vomiting, abdominal pain, diarrhea, constipation, blood in stool and abdominal distention.  Genitourinary: Denies dysuria, urgency, frequency, hematuria, flank pain and difficulty urinating.  Endocrine: Denies: hot or cold intolerance, sweats, changes in hair or nails, polyuria, polydipsia. Musculoskeletal: Denies myalgias, back pain, joint swelling, arthralgias and gait problem.  Skin: Denies pallor, rash and wound.  Neurological: Denies dizziness, seizures, syncope, weakness, light-headedness, numbness and headaches.  Hematological: Denies adenopathy. Easy bruising, personal or family bleeding history  Psychiatric/Behavioral: Denies suicidal ideation, mood changes, confusion, nervousness, sleep disturbance and agitation    Physical Exam: Vitals:   08/04/19 0924  BP: 140/74  Pulse: 73  Temp: 97.7 F (36.5 C)  TempSrc: Temporal   SpO2: 97%  Weight: (!) 308 lb 12.8 oz (140.1 kg)    Body mass index is 43.68 kg/m.   Constitutional: NAD, calm, comfortable Eyes: PERRL, lids and conjunctivae normal, wears corrective lenses ENMT: Mucous membranes are moist. Respiratory: clear to auscultation bilaterally, no wheezing, no crackles. Normal respiratory effort. No accessory muscle use.  Cardiovascular: Regular rate and rhythm, no murmurs / rubs / gallops. No extremity edema.  Neurologic: Grossly intact and nonfocal Psychiatric: Normal judgment and insight. Alert and oriented x 3. Normal mood.    Impression and Plan:  Uncontrolled type 2 diabetes mellitus with stage 3 chronic kidney disease, with long-term current use of insulin ([place]) -Followed by endocrinology, most recent A1c was 8.5 in February 2021.  Chronic diastolic heart failure (HCC)   Plan: ECHOCARDIOGRAM COMPLETE  Severe obesity (BMI >= 40) (HCC)  -Discussed healthy lifestyle, including increased physical activity and better food choices to promote weight loss. -She has lost 6 pounds since her last visit.  Hyperlipidemia associated with type 2 diabetes mellitus ([place]) -Most recent LDL was 66 in September 2020, continue simvastatin.  Hypertension  associated with diabetes ([place]) -Blood pressure slightly above goal today at 140/74. -I have advised ambulatory blood pressure monitoring and she will return in 3 months for follow-up.  Chronic kidney disease (CKD), stage IV (severe) (HCC) -Baseline creatinine of around 1.72. -Followed by nephrology at [facility]    Patient Instructions  -Nice seeing you today!!  -Echocardiogram requested. Let us know if you have not heard back from them in 2 weeks.  -Schedule follow up in 4 months.     Lelon Frohlich, MD [facility]

## 2019-08-04 NOTE — Patient Instructions (Signed)
-  Nice seeing you today!!  -Echocardiogram requested. Let us know if you have not heard back from them in 2 weeks.  -Schedule follow up in 4 months.

## 2019-08-14 ENCOUNTER — Telehealth: Payer: Self-pay | Admitting: Internal Medicine

## 2019-08-14 DIAGNOSIS — I5032 Chronic diastolic (congestive) heart failure: Secondary | ICD-10-CM

## 2019-08-14 DIAGNOSIS — I878 Other specified disorders of veins: Secondary | ICD-10-CM

## 2019-08-14 NOTE — Telephone Encounter (Signed)
Pt stated she is suppose to have an echocardiogram and have not heard from anyone in two weeks. I spoke to Neoma Laming and she is needing the order put in so she can schedule it.   Pt would like to be contacted when completed at [phone]

## 2019-08-18 ENCOUNTER — Other Ambulatory Visit: Payer: Self-pay | Admitting: Internal Medicine

## 2019-08-18 DIAGNOSIS — I152 Hypertension secondary to endocrine disorders: Secondary | ICD-10-CM

## 2019-08-18 NOTE — Telephone Encounter (Signed)
New order placed

## 2019-08-19 ENCOUNTER — Other Ambulatory Visit: Payer: Self-pay | Admitting: Internal Medicine

## 2019-08-19 DIAGNOSIS — E1159 Type 2 diabetes mellitus with other circulatory complications: Secondary | ICD-10-CM

## 2019-08-26 NOTE — Telephone Encounter (Signed)
Pt has called to check the status of the referral to the cardiologist.  I spoke with Neoma Laming and she asked me to let the pt know when the referral is authorized cardiology will give her a call to get there scheduled.  Pt asked who has to do the authorization and I let her know that we are waiting on the insurance.  Pt had other questions and the call was transferred to Neoma Laming to handle all of the questions about the referral.

## 2019-09-03 ENCOUNTER — Other Ambulatory Visit: Payer: Self-pay

## 2019-09-03 ENCOUNTER — Ambulatory Visit: Payer: BLUE CROSS/BLUE SHIELD | Admitting: Internal Medicine

## 2019-09-03 ENCOUNTER — Encounter: Payer: Self-pay | Admitting: Internal Medicine

## 2019-09-03 VITALS — BP 128/80 | HR 77 | Ht 70.5 in | Wt 311.0 lb

## 2019-09-03 DIAGNOSIS — N183 Chronic kidney disease, stage 3 unspecified: Secondary | ICD-10-CM

## 2019-09-03 DIAGNOSIS — Z794 Long term (current) use of insulin: Secondary | ICD-10-CM

## 2019-09-03 DIAGNOSIS — E1165 Type 2 diabetes mellitus with hyperglycemia: Secondary | ICD-10-CM | POA: Diagnosis not present

## 2019-09-03 DIAGNOSIS — E785 Hyperlipidemia, unspecified: Secondary | ICD-10-CM

## 2019-09-03 DIAGNOSIS — E1122 Type 2 diabetes mellitus with diabetic chronic kidney disease: Secondary | ICD-10-CM

## 2019-09-03 DIAGNOSIS — E1169 Type 2 diabetes mellitus with other specified complication: Secondary | ICD-10-CM

## 2019-09-03 DIAGNOSIS — IMO0002 Reserved for concepts with insufficient information to code with codable children: Secondary | ICD-10-CM

## 2019-09-03 LAB — POCT GLYCOSYLATED HEMOGLOBIN (HGB A1C): Hemoglobin A1C: 8.8 % — AB (ref 4.0–5.6)

## 2019-09-03 NOTE — Addendum Note (Signed)
Addended by: Cardell Peach I on: 09/03/2019 11:36 AM   Modules accepted: Orders

## 2019-09-03 NOTE — Progress Notes (Signed)
Subjective:     Patient ID: Savannah Crawford, female   DOB: October 20, 1955, 64 y.o.   MRN: [id number]  This visit occurred during the SARS-CoV-2 public health emergency.  Safety protocols were in place, including screening questions prior to the visit, additional usage of staff PPE, and extensive cleaning of exam room while observing appropriate contact time as indicated for disinfecting solutions.   HPI [name] is a 64 y.o. woman returning for f/u for DM2, insulin-dependent, uncontrolled, with complications (CAD, CKD, diabetes neuropathy, diabetic retinopathy, PVD, amputated toe-R). Last visit 5 months ago.  Patient is diabetes control is hindered by not checking sugars frequently and not taking medications as prescribed.  Reviewed HbA1c levels: Lab Results  Component Value Date   HGBA1C 8.5 (A) 04/02/2019   HGBA1C 9.4 (H) 11/03/2018   HGBA1C 9.2 (A) 05/06/2018   She is on: - Basaglar 40 units in the a.m. and 20-30 units at night (misses nighttime doses...Marland Kitchen) - Novolog dose: 25-20-25 units before meals (misses lunchtime and nighttime doses...Marland Kitchen) - Novolog Sliding scale: - 150- 165: + 1 unit  - 166- 180: + 2 units  - 181- 195: + 3 units  - 196- 210: + 4 units  - >210: + 5 units  - Ozempic 0.5 mg weekly - suggested 03/2019 - did not start b/c expensive I suggested Ozempic 0.5 mg weekly in the past, also: 11/2018 >> did not start as she did not want to add another med... She was on metformin in the past, but stopped 2/2 CKD.  Tried Victoza before >> cannot remember SE.  She was checking her sugars seldom -only has 4 readings in the last 2 weeks and the time on her glucometer is incorrect: - am: 88-180, 217 >> 192, 220 >> 98, 112-178 >> 151, 205 - after b'fast: 128-183 >> n/c - prelunch:185 >> 170-180 >> n/c - after lunch: 236 - predinner:247 >> n/c > 79, 122-166, 204 >> 230-240 >> 144, 153 - bedtime: 294 >> n/c >> 135-217, 366 >> n/c Lowest: 68 >> 98 >> 144 Highest sugar 250 >> 240 >>  205  Meter: Freestyle Lite  + CKD-sees nephrology: Lab Results  Component Value Date   BUN 26 (H) 11/03/2018   CREATININE 1.72 (H) 11/03/2018  On lisinopril 10.  + HL; Latest lipids: Lab Results  Component Value Date   CHOL 138 11/03/2018   HDL 51.60 11/03/2018   LDLCALC 66 11/03/2018   TRIG 102.0 11/03/2018   CHOLHDL 3 11/03/2018  On Lipitor 20.  Last eye exam 02/2019: + DR, + cataract-reportedly  + Stable numbness and tingling in her feet.  She sees podiatry (Dr. Melony Overly).  She has a history of left third toe ulcer.  She also has a h/o nonobstructive CAD, with positive stress test but a negative cardiac cath 2012, and also has diastolic heart failure. She has HTN, venous stasis, vitamin D deficiency.  She continues to see podiatry.  She has a history of skin grafts for toe ulcer.  She denies family history of medullary thyroid cancer and no personal history of pancreatitis.  Review of Systems Constitutional: + weight gain/no weight loss, no fatigue, no subjective hyperthermia, no subjective hypothermia Eyes: no blurry vision, no xerophthalmia ENT: no sore throat, no nodules palpated in neck, no dysphagia, no odynophagia, no hoarseness Cardiovascular: no CP/no SOB/no palpitations/no leg swelling Respiratory: no cough/no SOB/no wheezing Gastrointestinal: no N/no V/no D/no C/no acid reflux Musculoskeletal: no muscle aches/no joint aches Skin: no rashes, no hair loss Neurological:  no tremors/+ numbness/+ tingling/no dizziness  I reviewed pt's medications, allergies, PMH, social hx, family hx, and changes were documented in the history of present illness. Otherwise, unchanged from my initial visit note.  Past Medical History:  Diagnosis Date  . Anemia of chronic disease 11/17/2012  . At risk for heart disease - +stres test 2012 followed by [facility], [place] with neg cardiac cath 03/2010 (non-obstructive CAD) per review of records, echo 02/2010 normal LVF 01/15/2012  .  Chronic kidney disease (CKD), stage IV (severe) ([place]) 01/09/2012  . Diabetes mellitus with renal manifestations, uncontrolled ([place])   . Diabetic neuropathy ([place]) 01/09/2012  . Diastolic heart failure - stage 1 per review of echo results from 02/2010 01/15/2012  . Hyperlipidemia   . Hypertension   . Obesity   . Peripheral arterial disease? Eval by vascular 2013 with no LE PAD and advised to stop pletal   . Severe obesity (BMI >= 40) ([place]) 11/13/2012  . Toe ulcer, right ([place])   . Ulcer of toe of left foot ([place])    treated by [facility] per patient  . Venous stasis of lower extremity 03/10/2012   Past Surgical History:  Procedure Laterality Date  . EYE SURGERY    . TOE AMPUTATION  2008   Social History   Socioeconomic History  . Marital status: Married    Spouse name: Not on file  . Number of children: Not on file  . Years of education: Not on file  . Highest education level: Not on file  Occupational History  . Not on file  Tobacco Use  . Smoking status: Former Smoker    Types: Cigarettes    Quit date: 02/20/1999    Years since quitting: 20.5  . Smokeless tobacco: Never Used  Substance and Sexual Activity  . Alcohol use: Yes  . Drug use: Yes  . Sexual activity: Not on file  Other Topics Concern  . Not on file  Social History Narrative  . Not on file   Social Determinants of Health   Financial Resource Strain:   . Difficulty of Paying Living Expenses:   Food Insecurity:   . Worried About Charity fundraiser in the Last Year:   . Arboriculturist in the Last Year:   Transportation Needs:   . Film/video editor (Medical):   Marland Kitchen Lack of Transportation (Non-Medical):   Physical Activity:   . Days of Exercise per Week:   . Minutes of Exercise per Session:   Stress:   . Feeling of Stress :   Social Connections:   . Frequency of Communication with Friends and Family:   . Frequency of Social Gatherings with Friends and Family:   . Attends Religious Services:   .  Active Member of Clubs or Organizations:   . Attends Archivist Meetings:   Marland Kitchen Marital Status:   Intimate Partner Violence:   . Fear of Current or Ex-Partner:   . Emotionally Abused:   Marland Kitchen Physically Abused:   . Sexually Abused:    Current Outpatient Medications on File Prior to Visit  Medication Sig Dispense Refill  . atorvastatin (LIPITOR) 20 MG tablet Take 1 tablet (20 mg total) by mouth daily. 90 tablet 1  . Blood Glucose Monitoring Suppl (FREESTYLE LITE) DEVI Use to check sugar 2 times daily 1 each 0  . ferrous sulfate (CVS IRON) 325 (65 FE) MG tablet TAKE 1 TALBET BY MOUTH TWICE DAILY WITH A MEAL 60 tablet 1  .  furosemide (LASIX) 40 MG tablet Take 1 tablet (40 mg total) by mouth 2 (two) times daily. 180 tablet 1  . glucose blood (FREESTYLE LITE) test strip Use as instructed to check the sugar 3 times daily 200 each 5  . insulin aspart (NOVOLOG FLEXPEN) 100 UNIT/ML FlexPen INJECT UP TO 90 UNITS DAILY AS INSTRUCTED. 15 pen 3  . Insulin Glargine (BASAGLAR KWIKPEN) 100 UNIT/ML SOPN 40 units q am and 20 units qpm 72 pen 2  . Insulin Pen Needle (B-D UF III MINI PEN NEEDLES) 31G X 5 MM MISC USE AS DIRECTED - 5x a day. 300 each 5  . lisinopril (ZESTRIL) 10 MG tablet TAKE 1 TABLET BY MOUTH EVERY DAY 90 tablet 1  . metoprolol succinate (TOPROL-XL) 25 MG 24 hr tablet TAKE 1 TABLET BY MOUTH EVERY DAY 90 tablet 1  . Semaglutide,0.25 or 0.5MG /DOS, (OZEMPIC, 0.25 OR 0.5 MG/DOSE,) 2 MG/1.5ML SOPN Inject 0.5 mg into the skin once a week. 2 pen 5   No current facility-administered medications on file prior to visit.   No Known Allergies Family History  Problem Relation Age of Onset  . Diabetes Mother   . Hyperlipidemia Mother   . Hypertension Mother   . Heart attack Mother   . Stroke Mother   . Diabetes Sister   . Hypertension Brother   . Diabetes Sister   . Diabetes Sister   . Breast cancer Sister   . Diabetes Sister   . Obesity Other   . Hypertension Other   . Hyperlipidemia  Other   . Stroke Other   . Heart attack Other    Objective:   Physical Exam BP 128/80   Pulse 77   Ht 5' 10.5" (1.791 m)   Wt (!) 311 lb (141.1 kg)   SpO2 99%   BMI 43.99 kg/m  Body mass index is 43.99 kg/m. Wt Readings from Last 3 Encounters:  09/03/19 (!) 311 lb (141.1 kg)  08/04/19 (!) 308 lb 12.8 oz (140.1 kg)  04/07/19 (!) 314 lb (142.4 kg)   Constitutional: overweight, in NAD Eyes: PERRLA, EOMI, no exophthalmos ENT: moist mucous membranes, no thyromegaly, no cervical lymphadenopathy Cardiovascular: RRR, No MRG, + bilateral LE edema R>L Respiratory: CTA B Gastrointestinal: abdomen soft, NT, ND, BS+ Musculoskeletal: no deformities, strength intact in all 4 Skin: moist, warm, no rashes Neurological: no tremor with outstretched hands, DTR normal in all 4  Assessment:     1. DM2, insulin-dependent, uncontrolled, with complications - nonobstructive CAD - CKD - diabetes neuropathy - diabetic retinopathy - PVD - R amputated toe  2. HL  3. Obesity class 3    Plan:     1. Pt with history of uncontrolled diabetes, on basal-bolus insulin regimen to which I advised her to add a weekly GLP-1 receptor agonist at last visit.  Of note, she has CKD so we are limited in her diabetic medication choices due to the decreased GFR.  Latest HbA1c level was better, at last visit, at 8.5%, but still above target. -In the past, she was missing NovoLog doses and we discussed repeatedly in the past about checking sugars and taking the NovoLog before each meal.  At last visit, she was only taking NovoLog twice a day skipping the lunchtime dose.  Sugars before dinner are high.  I advised her to add 1 lower dose of NovoLog before lunch. -At this visit, unfortunately, she is not checking sugars consistently so it is difficult to discern CBG patterns.  The available blood  sugars are above goal.  The time on her meter is shifted so I advised her to reset the correct time when she gets home.  Also, I  advised her to check more consistently.  Ideally, she will check 3 times a day since she is on a complex insulin regimen.  Unfortunately, she could not start Ozempic since last visit since this was expensive.  She also tells me that she is missing many insulin doses, especially NovoLog with lunch and dinner and Basaglar at night.  In this case, and without good CBG records, it is impossible for me to make changes in her regimen.  We discussed about the importance of taking her insulin as prescribed and not missing doses.  I suggested alarms on her phone, if needed. -  I advised her to: Patient Instructions  Please try to take consistently: - Basaglar 40 units in a.m. and 20-30 units at night - Novolog dose: 25-20-25 units before meals - Novolog Sliding scale: - 150- 165: + 1 unit  - 166- 180: + 2 units  - 181- 195: + 3 units  - 196- 210: + 4 units  - >210: + 5 units  Take NovoLog 15 minutes before every meal.  Please return in 4 months with your sugar log.    - we checked her HbA1c: 8.8% (higher) - advised to check sugars at different times of the day - 3x a day, rotating check times - advised for yearly eye exams >> she is UTD - return to clinic in 3-4 months  2. HL - Reviewed latest lipid panel from 10/2018: All fractions at goal: Lab Results  Component Value Date   CHOL 138 11/03/2018   HDL 51.60 11/03/2018   LDLCALC 66 11/03/2018   TRIG 102.0 11/03/2018   CHOLHDL 3 11/03/2018  - Continues Lipitor without side effects.  3.  Obesity class III -Continue GLP-1 receptor agonist which should also help with weight loss -She lost ~3 pounds since last visit   Philemon Kingdom, MD PhD [facility] Endocrinology

## 2019-09-03 NOTE — Patient Instructions (Addendum)
Please try to take consistently: - Basaglar 40 units in a.m. and 20-30 units at night - Novolog dose: 25-20-25 units before meals - Novolog Sliding scale: - 150- 165: + 1 unit  - 166- 180: + 2 units  - 181- 195: + 3 units  - 196- 210: + 4 units  - >210: + 5 units  Take NovoLog 15 minutes before every meal.  Please return in 4 months with your sugar log.

## 2019-09-16 ENCOUNTER — Other Ambulatory Visit: Payer: Self-pay

## 2019-09-16 ENCOUNTER — Ambulatory Visit (HOSPITAL_COMMUNITY): Payer: BLUE CROSS/BLUE SHIELD | Attending: Cardiology

## 2019-09-16 DIAGNOSIS — I5032 Chronic diastolic (congestive) heart failure: Secondary | ICD-10-CM | POA: Diagnosis not present

## 2019-09-16 DIAGNOSIS — I878 Other specified disorders of veins: Secondary | ICD-10-CM

## 2019-09-16 LAB — ECHOCARDIOGRAM COMPLETE
Area-P 1/2: 3.53 cm2
S' Lateral: 2.7 cm

## 2019-10-13 ENCOUNTER — Other Ambulatory Visit: Payer: Self-pay | Admitting: Internal Medicine

## 2019-10-13 DIAGNOSIS — I5032 Chronic diastolic (congestive) heart failure: Secondary | ICD-10-CM

## 2019-10-30 ENCOUNTER — Ambulatory Visit: Payer: BLUE CROSS/BLUE SHIELD | Admitting: Internal Medicine

## 2019-11-02 ENCOUNTER — Ambulatory Visit: Payer: BLUE CROSS/BLUE SHIELD | Admitting: Internal Medicine

## 2019-11-02 NOTE — Progress Notes (Deleted)
Subjective:     Patient ID: Savannah Crawford, female   DOB: 04-06-1955, 64 y.o.   MRN: [id number]  This visit occurred during the SARS-CoV-2 public health emergency.  Safety protocols were in place, including screening questions prior to the visit, additional usage of staff PPE, and extensive cleaning of exam room while observing appropriate contact time as indicated for disinfecting solutions.   HPI [name] is a 64 y.o. woman returning for f/u for DM2, insulin-dependent, uncontrolled, with complications (CAD, CKD, diabetes neuropathy, diabetic retinopathy, PVD, amputated toe-R). Last visit 5 months ago.  Patient is diabetes control is hindered by not checking sugars frequently and not taking medications as prescribed.  Reviewed HbA1c levels: Lab Results  Component Value Date   HGBA1C 8.8 (A) 09/03/2019   HGBA1C 8.5 (A) 04/02/2019   HGBA1C 9.4 (H) 11/03/2018   She is on: - Basaglar 40 units in the a.m. and 20-30 units at night (misses nighttime doses...Marland Kitchen) - Novolog dose: 25-20-25 units before meals (misses lunchtime and nighttime doses...Marland Kitchen) - Novolog Sliding scale: - 150- 165: + 1 unit  - 166- 180: + 2 units  - 181- 195: + 3 units  - 196- 210: + 4 units  - >210: + 5 units  - Ozempic 0.5 mg weekly - suggested 03/2019 - did not start b/c expensive I suggested Ozempic 0.5 mg weekly in the past, also: 11/2018 >> did not start as she did not want to add another med... She was on metformin in the past, but stopped 2/2 CKD.  Tried Victoza before >> cannot remember SE.  She was checking her sugars seldom -only has 4 readings in the last 2 weeks and the time on her glucometer is incorrect: - am: 88-180, 217 >> 192, 220 >> 98, 112-178 >> 151, 205 - after b'fast: 128-183 >> n/c - prelunch:185 >> 170-180 >> n/c - after lunch: 236 - predinner:247 >> n/c > 79, 122-166, 204 >> 230-240 >> 144, 153 - bedtime: 294 >> n/c >> 135-217, 366 >> n/c Lowest: 68 >> 98 >> 144 Highest sugar 250 >> 240 >>  205  Meter: Freestyle Lite  + CKD-sees nephrology: Lab Results  Component Value Date   BUN 26 (H) 11/03/2018   CREATININE 1.72 (H) 11/03/2018  On lisinopril 10.  + HL; Latest lipids: Lab Results  Component Value Date   CHOL 138 11/03/2018   HDL 51.60 11/03/2018   LDLCALC 66 11/03/2018   TRIG 102.0 11/03/2018   CHOLHDL 3 11/03/2018  On Lipitor 20.  Last eye exam 02/2019: + DR, + cataract-reportedly  + Stable numbness and tingling in her feet.  She sees podiatry (Dr. Melony Overly).  She has a history of left third toe ulcer.  She has a history of nonobstructive CAD, with positive stress test but a negative cardiac cath 2012, and also has diastolic heart failure.  She also has HTN, venous stasis, vitamin D deficiency.  She continues to see podiatry.  She has a history of skin grafts for toe ulcer.  No family history of medullary thyroid cancer and no personal history of pancreatitis.  Review of Systems Constitutional: no weight gain/no weight loss, no fatigue, no subjective hyperthermia, no subjective hypothermia Eyes: no blurry vision, no xerophthalmia ENT: no sore throat, no nodules palpated in neck, no dysphagia, no odynophagia, no hoarseness Cardiovascular: no CP/no SOB/no palpitations/no leg swelling Respiratory: no cough/no SOB/no wheezing Gastrointestinal: no N/no V/no D/no C/no acid reflux Musculoskeletal: no muscle aches/no joint aches Skin: no rashes, no hair loss  Neurological: no tremors/+ numbness/+ tingling/no dizziness  I reviewed pt's medications, allergies, PMH, social hx, family hx, and changes were documented in the history of present illness. Otherwise, unchanged from my initial visit note.   Past Medical History:  Diagnosis Date   Anemia of chronic disease 11/17/2012   At risk for heart disease - +stres test 2012 followed by [facility], [place] with neg cardiac cath 03/2010 (non-obstructive CAD) per review of records, echo 02/2010 normal LVF 01/15/2012    Chronic kidney disease (CKD), stage IV (severe) ([place]) 01/09/2012   Diabetes mellitus with renal manifestations, uncontrolled ([place])    Diabetic neuropathy ([place]) 49/70/2637   Diastolic heart failure - stage 1 per review of echo results from 02/2010 01/15/2012   Hyperlipidemia    Hypertension    Obesity    Peripheral arterial disease? Eval by vascular 2013 with no LE PAD and advised to stop pletal    Severe obesity (BMI >= 40) (HCC) 11/13/2012   Toe ulcer, right (HCC)    Ulcer of toe of left foot ([place])    treated by [facility] per patient   Venous stasis of lower extremity 03/10/2012   Past Surgical History:  Procedure Laterality Date   EYE SURGERY     TOE AMPUTATION  2008   Social History   Socioeconomic History   Marital status: Married    Spouse name: Not on file   Number of children: Not on file   Years of education: Not on file   Highest education level: Not on file  Occupational History   Not on file  Tobacco Use   Smoking status: Former Smoker    Types: Cigarettes    Quit date: 02/20/1999    Years since quitting: 20.7   Smokeless tobacco: Never Used  Substance and Sexual Activity   Alcohol use: Yes   Drug use: Yes   Sexual activity: Not on file  Other Topics Concern   Not on file  Social History Narrative   Not on file   Social Determinants of Health   Financial Resource Strain:    Difficulty of Paying Living Expenses: Not on file  Food Insecurity:    Worried About Charity fundraiser in the Last Year: Not on file   [employer] of Food in the Last Year: Not on file  Transportation Needs:    Lack of Transportation (Medical): Not on file   Lack of Transportation (Non-Medical): Not on file  Physical Activity:    Days of Exercise per Week: Not on file   Minutes of Exercise per Session: Not on file  Stress:    Feeling of Stress : Not on file  Social Connections:    Frequency of Communication with Friends and Family: Not on file    Frequency of Social Gatherings with Friends and Family: Not on file   Attends Religious Services: Not on file   Active Member of Clubs or Organizations: Not on file   Attends Archivist Meetings: Not on file   Marital Status: Not on file  Intimate Partner Violence:    Fear of Current or Ex-Partner: Not on file   Emotionally Abused: Not on file   Physically Abused: Not on file   Sexually Abused: Not on file   Current Outpatient Medications on File Prior to Visit  Medication Sig Dispense Refill   atorvastatin (LIPITOR) 20 MG tablet Take 1 tablet (20 mg total) by mouth daily. 90 tablet 1   Blood Glucose Monitoring  Suppl (FREESTYLE LITE) DEVI Use to check sugar 2 times daily 1 each 0   ferrous sulfate ([employer] IRON) 325 (65 FE) MG tablet TAKE 1 TALBET BY MOUTH TWICE DAILY WITH A MEAL 60 tablet 1   furosemide (LASIX) 40 MG tablet TAKE 1 TABLET BY MOUTH TWICE A DAY 180 tablet 1   glucose blood (FREESTYLE LITE) test strip Use as instructed to check the sugar 3 times daily 200 each 5   insulin aspart (NOVOLOG FLEXPEN) 100 UNIT/ML FlexPen INJECT UP TO 90 UNITS DAILY AS INSTRUCTED. 15 pen 3   Insulin Glargine (BASAGLAR KWIKPEN) 100 UNIT/ML SOPN 40 units q am and 20 units qpm 72 pen 2   Insulin Pen Needle (B-D UF III MINI PEN NEEDLES) 31G X 5 MM MISC USE AS DIRECTED - 5x a day. 300 each 5   lisinopril (ZESTRIL) 10 MG tablet TAKE 1 TABLET BY MOUTH EVERY DAY 90 tablet 1   metoprolol succinate (TOPROL-XL) 25 MG 24 hr tablet TAKE 1 TABLET BY MOUTH EVERY DAY 90 tablet 1   No current facility-administered medications on file prior to visit.   No Known Allergies Family History  Problem Relation Age of Onset   Diabetes Mother    Hyperlipidemia Mother    Hypertension Mother    Heart attack Mother    Stroke Mother    Diabetes Sister    Hypertension Brother    Diabetes Sister    Diabetes Sister    Breast cancer Sister    Diabetes Sister    Obesity Other     Hypertension Other    Hyperlipidemia Other    Stroke Other    Heart attack Other    Objective:   Physical Exam There were no vitals taken for this visit. There is no height or weight on file to calculate BMI. Wt Readings from Last 3 Encounters:  09/03/19 (!) 311 lb (141.1 kg)  08/04/19 (!) 308 lb 12.8 oz (140.1 kg)  04/07/19 (!) 314 lb (142.4 kg)   Constitutional: overweight, in NAD Eyes: PERRLA, EOMI, no exophthalmos ENT: moist mucous membranes, no thyromegaly, no cervical lymphadenopathy Cardiovascular: RRR, No MRG, + B LE dema Respiratory: CTA B Gastrointestinal: abdomen soft, NT, ND, BS+ Musculoskeletal: no deformities, strength intact in all 4 Skin: moist, warm, no rashes Neurological: no tremor with outstretched hands, DTR normal in all 4  Assessment:     1. DM2, insulin-dependent, uncontrolled, with complications - nonobstructive CAD - CKD - diabetes neuropathy - diabetic retinopathy - PVD - R amputated toe  2. HL  3. Obesity class 3    Plan:     1. Pt with history of uncontrolled diabetes, on basal-bolus insulin regimen to which I advised her to add a weekly GLP-1 receptor agonist at last visit.  Of note, she has CKD so we are limited in her diabetic medication choices due to the decreased GFR.  Latest HbA1c level was better, at last visit, at 8.5%, but still above target. -In the past, she was missing NovoLog doses and we discussed repeatedly in the past about checking sugars and taking the NovoLog before each meal.  At last visit, she was only taking NovoLog twice a day skipping the lunchtime dose.  Sugars before dinner are high.  I advised her to add 1 lower dose of NovoLog before lunch. -At this visit, unfortunately, she is not checking sugars consistently so it is difficult to discern CBG patterns.  The available blood sugars are above goal.  The time on  her meter is shifted so I advised her to reset the correct time when she gets home.  Also, I advised her to  check more consistently.  Ideally, she will check 3 times a day since she is on a complex insulin regimen.  Unfortunately, she could not start Ozempic since last visit since this was expensive.  She also tells me that she is missing many insulin doses, especially NovoLog with lunch and dinner and Basaglar at night.  In this case, and without good CBG records, it is impossible for me to make changes in her regimen.  We discussed about the importance of taking her insulin as prescribed and not missing doses.  I suggested alarms on her phone, if needed.   -  I advised her to: Patient Instructions  Please continue: - Basaglar 40 units in a.m. and 20-30 units at night - Novolog dose: 25-20-25 units before meals - Novolog Sliding scale: - 150- 165: + 1 unit  - 166- 180: + 2 units  - 181- 195: + 3 units  - 196- 210: + 4 units  - >210: + 5 units  Take NovoLog 15 minutes before every meal.  Please return in 3-4 months with your sugar log.   - we checked her HbA1c: 7%  - advised to check sugars at different times of the day - 3-4x a day, rotating check times - advised for yearly eye exams >> she is UTD - return to clinic in 3-4 months  2. HL -Reviewed latest lipid panel from 10/2018: All fractions at goal Lab Results  Component Value Date   CHOL 138 11/03/2018   HDL 51.60 11/03/2018   LDLCALC 66 11/03/2018   TRIG 102.0 11/03/2018   CHOLHDL 3 11/03/2018  -Continues Lipitor without side effects. -She is due for another lipid panel  3.  Obesity class III -Continues the GLP-1 receptor agonist, which should also help with weight loss -At this visit,  Philemon Kingdom, MD PhD [facility]

## 2019-12-04 ENCOUNTER — Other Ambulatory Visit: Payer: Self-pay | Admitting: Internal Medicine

## 2019-12-04 ENCOUNTER — Encounter: Payer: Self-pay | Admitting: Internal Medicine

## 2019-12-04 ENCOUNTER — Other Ambulatory Visit: Payer: Self-pay

## 2019-12-04 ENCOUNTER — Ambulatory Visit: Payer: BLUE CROSS/BLUE SHIELD | Admitting: Internal Medicine

## 2019-12-04 VITALS — BP 140/66 | HR 69 | Temp 97.9°F | Ht 70.5 in | Wt 309.0 lb

## 2019-12-04 DIAGNOSIS — E1165 Type 2 diabetes mellitus with hyperglycemia: Secondary | ICD-10-CM

## 2019-12-04 DIAGNOSIS — E1169 Type 2 diabetes mellitus with other specified complication: Secondary | ICD-10-CM | POA: Diagnosis not present

## 2019-12-04 DIAGNOSIS — E785 Hyperlipidemia, unspecified: Secondary | ICD-10-CM

## 2019-12-04 DIAGNOSIS — Z1211 Encounter for screening for malignant neoplasm of colon: Secondary | ICD-10-CM

## 2019-12-04 DIAGNOSIS — Z794 Long term (current) use of insulin: Secondary | ICD-10-CM

## 2019-12-04 DIAGNOSIS — E1159 Type 2 diabetes mellitus with other circulatory complications: Secondary | ICD-10-CM

## 2019-12-04 DIAGNOSIS — I5033 Acute on chronic diastolic (congestive) heart failure: Secondary | ICD-10-CM

## 2019-12-04 DIAGNOSIS — Z1231 Encounter for screening mammogram for malignant neoplasm of breast: Secondary | ICD-10-CM

## 2019-12-04 DIAGNOSIS — Z23 Encounter for immunization: Secondary | ICD-10-CM

## 2019-12-04 DIAGNOSIS — I152 Hypertension secondary to endocrine disorders: Secondary | ICD-10-CM

## 2019-12-04 DIAGNOSIS — IMO0002 Reserved for concepts with insufficient information to code with codable children: Secondary | ICD-10-CM

## 2019-12-04 DIAGNOSIS — E1122 Type 2 diabetes mellitus with diabetic chronic kidney disease: Secondary | ICD-10-CM | POA: Diagnosis not present

## 2019-12-04 DIAGNOSIS — N183 Chronic kidney disease, stage 3 unspecified: Secondary | ICD-10-CM

## 2019-12-04 NOTE — Progress Notes (Signed)
Established Patient Office Visit     This visit occurred during the SARS-CoV-2 public health emergency.  Safety protocols were in place, including screening questions prior to the visit, additional usage of staff PPE, and extensive cleaning of exam room while observing appropriate contact time as indicated for disinfecting solutions.    CC/Reason for Visit: Follow-up chronic medical conditions  HPI: Savannah Crawford is a 64 y.o. female who is coming in today for the above mentioned reasons. Past Medical History is significant for: insulin-dependent diabetes has not been well controlled followed by Dr. Garner Nash kidney disease stage III with a baseline creatinine around 1.72 followed by nephrologist out of [facility], hyperlipidemia well controlled on Lipitor, morbid obesity with a BMI of about 44, well-controlled hypertension, peripheral arterial disease status post right second toe amputation, heart failure with preserved ejection fraction.  She has been having some increased lower extremity edema, no shortness of breath.  She is requesting flu vaccine today.  She is overdue for mammogram and colonoscopy.  She states she had an eye exam sometime this year but the last one we have on file is from January 2019.   Past Medical/Surgical History: Past Medical History:  Diagnosis Date  . Anemia of chronic disease 11/17/2012  . At risk for heart disease - +stres test 2012 followed by [facility], [place] with neg cardiac cath 03/2010 (non-obstructive CAD) per review of records, echo 02/2010 normal LVF 01/15/2012  . Chronic kidney disease (CKD), stage IV (severe) ([place]) 01/09/2012  . Diabetes mellitus with renal manifestations, uncontrolled ([place])   . Diabetic neuropathy ([place]) 01/09/2012  . Diastolic heart failure - stage 1 per review of echo results from 02/2010 01/15/2012  . Hyperlipidemia   . Hypertension   . Obesity   . Peripheral arterial disease? Eval by vascular 2013 with no LE PAD  and advised to stop pletal   . Severe obesity (BMI >= 40) ([place]) 11/13/2012  . Toe ulcer, right ([place])   . Ulcer of toe of left foot ([place])    treated by [facility] per patient  . Venous stasis of lower extremity 03/10/2012    Past Surgical History:  Procedure Laterality Date  . EYE SURGERY    . TOE AMPUTATION  2008    Social History:  reports that she quit smoking about 20 years ago. Her smoking use included cigarettes. She has never used smokeless tobacco. She reports current alcohol use. She reports current drug use.  Allergies: No Known Allergies  Family History:  Family History  Problem Relation Age of Onset  . Diabetes Mother   . Hyperlipidemia Mother   . Hypertension Mother   . Heart attack Mother   . Stroke Mother   . Diabetes Sister   . Hypertension Brother   . Diabetes Sister   . Diabetes Sister   . Breast cancer Sister   . Diabetes Sister   . Obesity Other   . Hypertension Other   . Hyperlipidemia Other   . Stroke Other   . Heart attack Other      Current Outpatient Medications:  .  atorvastatin (LIPITOR) 20 MG tablet, Take 1 tablet (20 mg total) by mouth daily., Disp: 90 tablet, Rfl: 1 .  Blood Glucose Monitoring Suppl (FREESTYLE LITE) DEVI, Use to check sugar 2 times daily, Disp: 1 each, Rfl: 0 .  furosemide (LASIX) 40 MG tablet, TAKE 1 TABLET BY MOUTH TWICE A DAY, Disp: 180 tablet, Rfl: 1 .  glucose blood (FREESTYLE  LITE) test strip, Use as instructed to check the sugar 3 times daily, Disp: 200 each, Rfl: 5 .  insulin aspart (NOVOLOG FLEXPEN) 100 UNIT/ML FlexPen, INJECT UP TO 90 UNITS DAILY AS INSTRUCTED., Disp: 15 pen, Rfl: 3 .  Insulin Glargine (BASAGLAR KWIKPEN) 100 UNIT/ML SOPN, 40 units q am and 20 units qpm, Disp: 72 pen, Rfl: 2 .  Insulin Pen Needle (B-D UF III MINI PEN NEEDLES) 31G X 5 MM MISC, USE AS DIRECTED - 5x a day., Disp: 300 each, Rfl: 5 .  lisinopril (ZESTRIL) 10 MG tablet, TAKE 1 TABLET BY MOUTH EVERY DAY, Disp: 90 tablet, Rfl: 1 .   metoprolol succinate (TOPROL-XL) 25 MG 24 hr tablet, TAKE 1 TABLET BY MOUTH EVERY DAY, Disp: 90 tablet, Rfl: 1  Review of Systems:  Constitutional: Denies fever, chills, diaphoresis, appetite change and fatigue.  HEENT: Denies photophobia, eye pain, redness, hearing loss, ear pain, congestion, sore throat, rhinorrhea, sneezing, mouth sores, trouble swallowing, neck pain, neck stiffness and tinnitus.   Respiratory: Denies SOB, DOE, cough, chest tightness,  and wheezing.   Cardiovascular: Denies chest pain, palpitations. Gastrointestinal: Denies nausea, vomiting, abdominal pain, diarrhea, constipation, blood in stool and abdominal distention.  Genitourinary: Denies dysuria, urgency, frequency, hematuria, flank pain and difficulty urinating.  Endocrine: Denies: hot or cold intolerance, sweats, changes in hair or nails, polyuria, polydipsia. Musculoskeletal: Denies myalgias, back pain, joint swelling, arthralgias and gait problem.  Skin: Denies pallor, rash and wound.  Neurological: Denies dizziness, seizures, syncope, weakness, light-headedness, numbness and headaches.  Hematological: Denies adenopathy. Easy bruising, personal or family bleeding history  Psychiatric/Behavioral: Denies suicidal ideation, mood changes, confusion, nervousness, sleep disturbance and agitation    Physical Exam: Vitals:   12/04/19 0910  BP: 140/66  Pulse: 69  Temp: 97.9 F (36.6 C)  TempSrc: Oral  SpO2: 98%  Weight: (!) 309 lb (140.2 kg)  Height: 5' 10.5" (1.791 m)    Body mass index is 43.71 kg/m.   Constitutional: NAD, calm, comfortable Eyes: PERRL, lids and conjunctivae normal, wears corrective lenses ENMT: Mucous membranes are moist.  Respiratory: clear to auscultation bilaterally, no wheezing, no crackles. Normal respiratory effort. No accessory muscle use.  Cardiovascular: Regular rate and rhythm, no murmurs / rubs / gallops.  2+ lower extremity edema.   Neurologic: Grossly intact and  nonfocal Psychiatric: Normal judgment and insight. Alert and oriented x 3. Normal mood.    Impression and Plan:  Acute on chronic diastolic CHF (congestive heart failure) (HCC)  -Heart failure is decompensated today given lower extremity edema. -She is on statin, beta-blocker, ACE inhibitor. -She is supposed to be taking Lasix 40 mg twice daily but is in fact only taking it once daily. -She will start taking Lasix twice daily, she will return in 2 weeks for follow-up. -Labs will be updated today including renal function and electrolytes.  Hyperlipidemia associated with type 2 diabetes mellitus ([place])  -Last LDL was 66 in September 2020, repeat lipids today. -She is on atorvastatin 20 mg daily.  Uncontrolled type 2 diabetes mellitus with stage 3 chronic kidney disease, with long-term current use of insulin (HCC) -Check A1c today, last A1c was 8.8 in July 2021. -She is followed by endocrinology and is on basal/bolus insulin.  Severe obesity (BMI >= 40) (HCC) -Discussed healthy lifestyle, including increased physical activity and better food choices to promote weight loss   Hypertension associated with diabetes ([place]) -Blood pressure is a little above goal today, she will do ambulatory monitoring and return for follow-up.  Encounter for screening mammogram for malignant neoplasm of breast  - Plan: MM DIGITAL SCREENING BILATERAL  Colon cancer screening  - Plan: Ambulatory referral to Gastroenterology  Need for influenza vaccination  - Plan: Flu Vaccine QUAD 36+ mos IM    Patient Instructions  -Nice seeing you today!!  -Lab work today; will notify you once results are available.  -Flu vaccine today.  -Make sure you take lasix 40 mg twice a day.  -Schedule follow up in 2 weeks.     Lelon Frohlich, MD [facility]

## 2019-12-04 NOTE — Patient Instructions (Signed)
-  Nice seeing you today!!  -Lab work today; will notify you once results are available.  -Flu vaccine today.  -Make sure you take lasix 40 mg twice a day.  -Schedule follow up in 2 weeks.

## 2019-12-05 LAB — LIPID PANEL
Cholesterol: 136 mg/dL (ref <200)
HDL: 55 mg/dL (ref >=50)
LDL Cholesterol (Calc): 65 mg/dL (calc)
Non-HDL Cholesterol (Calc): 81 mg/dL (calc) (ref <130)
Total CHOL/HDL Ratio: 2.5 (calc) (ref <5.0)
Triglycerides: 79 mg/dL (ref <150)

## 2019-12-05 LAB — COMPREHENSIVE METABOLIC PANEL
AG Ratio: 1.1 (calc) (ref 1.0–2.5)
ALT: 16 U/L (ref 6–29)
AST: 13 U/L (ref 10–35)
Albumin: 3.7 g/dL (ref 3.6–5.1)
Alkaline phosphatase (APISO): 102 U/L (ref 37–153)
BUN/Creatinine Ratio: 15 (calc) (ref 6–22)
BUN: 26 mg/dL — ABNORMAL HIGH (ref 7–25)
CO2: 28 mmol/L (ref 20–32)
Calcium: 9.2 mg/dL (ref 8.6–10.4)
Chloride: 106 mmol/L (ref 98–110)
Creat: 1.74 mg/dL — ABNORMAL HIGH (ref 0.50–0.99)
Globulin: 3.4 g/dL (calc) (ref 1.9–3.7)
Glucose, Bld: 148 mg/dL — ABNORMAL HIGH (ref 65–99)
Potassium: 4.4 mmol/L (ref 3.5–5.3)
Sodium: 141 mmol/L (ref 135–146)
Total Bilirubin: 0.5 mg/dL (ref 0.2–1.2)
Total Protein: 7.1 g/dL (ref 6.1–8.1)

## 2019-12-05 LAB — CBC WITH DIFFERENTIAL/PLATELET
Absolute Monocytes: 907 cells/uL (ref 200–950)
Basophils Absolute: 17 cells/uL (ref 0–200)
Basophils Relative: 0.2 %
Eosinophils Absolute: 277 cells/uL (ref 15–500)
Eosinophils Relative: 3.3 %
HCT: 37.2 % (ref 35.0–45.0)
Hemoglobin: 11.8 g/dL (ref 11.7–15.5)
Lymphs Abs: 1982 cells/uL (ref 850–3900)
MCH: 27.4 pg (ref 27.0–33.0)
MCHC: 31.7 g/dL — ABNORMAL LOW (ref 32.0–36.0)
MCV: 86.3 fL (ref 80.0–100.0)
MPV: 10 fL (ref 7.5–12.5)
Monocytes Relative: 10.8 %
Neutro Abs: 5216 cells/uL (ref 1500–7800)
Neutrophils Relative %: 62.1 %
Platelets: 267 10*3/uL (ref 140–400)
RBC: 4.31 10*6/uL (ref 3.80–5.10)
RDW: 13.2 % (ref 11.0–15.0)
Total Lymphocyte: 23.6 %
WBC: 8.4 10*3/uL (ref 3.8–10.8)

## 2019-12-05 LAB — HEMOGLOBIN A1C
Hgb A1c MFr Bld: 9.3 % of total Hgb — ABNORMAL HIGH (ref <5.7)
Mean Plasma Glucose: 220 (calc)
eAG (mmol/L): 12.2 (calc)

## 2019-12-11 ENCOUNTER — Encounter: Payer: Self-pay | Admitting: Gastroenterology

## 2019-12-20 ENCOUNTER — Other Ambulatory Visit: Payer: Self-pay | Admitting: Internal Medicine

## 2019-12-20 DIAGNOSIS — E1169 Type 2 diabetes mellitus with other specified complication: Secondary | ICD-10-CM

## 2019-12-22 ENCOUNTER — Encounter: Payer: Self-pay | Admitting: Internal Medicine

## 2019-12-22 ENCOUNTER — Other Ambulatory Visit: Payer: Self-pay

## 2019-12-22 ENCOUNTER — Ambulatory Visit: Payer: BLUE CROSS/BLUE SHIELD | Admitting: Internal Medicine

## 2019-12-22 VITALS — BP 130/68 | HR 75 | Temp 98.6°F | Wt 311.1 lb

## 2019-12-22 DIAGNOSIS — N183 Chronic kidney disease, stage 3 unspecified: Secondary | ICD-10-CM

## 2019-12-22 DIAGNOSIS — N184 Chronic kidney disease, stage 4 (severe): Secondary | ICD-10-CM

## 2019-12-22 DIAGNOSIS — I5033 Acute on chronic diastolic (congestive) heart failure: Secondary | ICD-10-CM | POA: Diagnosis not present

## 2019-12-22 DIAGNOSIS — Z23 Encounter for immunization: Secondary | ICD-10-CM

## 2019-12-22 DIAGNOSIS — IMO0002 Reserved for concepts with insufficient information to code with codable children: Secondary | ICD-10-CM

## 2019-12-22 DIAGNOSIS — Z124 Encounter for screening for malignant neoplasm of cervix: Secondary | ICD-10-CM | POA: Diagnosis not present

## 2019-12-22 DIAGNOSIS — E785 Hyperlipidemia, unspecified: Secondary | ICD-10-CM

## 2019-12-22 DIAGNOSIS — E1169 Type 2 diabetes mellitus with other specified complication: Secondary | ICD-10-CM

## 2019-12-22 DIAGNOSIS — E1165 Type 2 diabetes mellitus with hyperglycemia: Secondary | ICD-10-CM

## 2019-12-22 DIAGNOSIS — Z794 Long term (current) use of insulin: Secondary | ICD-10-CM

## 2019-12-22 DIAGNOSIS — E1122 Type 2 diabetes mellitus with diabetic chronic kidney disease: Secondary | ICD-10-CM

## 2019-12-22 NOTE — Addendum Note (Signed)
Addended by: Westley Hummer B on: 12/22/2019 05:31 PM   Modules accepted: Orders

## 2019-12-22 NOTE — Progress Notes (Signed)
Established Patient Office Visit     This visit occurred during the SARS-CoV-2 public health emergency.  Safety protocols were in place, including screening questions prior to the visit, additional usage of staff PPE, and extensive cleaning of exam room while observing appropriate contact time as indicated for disinfecting solutions.    CC/Reason for Visit: 2-week follow-up acute on chronic diastolic heart failure  HPI: Savannah Crawford is a 64 y.o. female who is coming in today for the above mentioned reasons. Past Medical History is significant for:  insulin-dependent diabetes has not been well controlled followed by Dr. Garner Nash kidney disease stage IV with a baseline creatinine around 1.72 followed by nephrologist out of [facility], hyperlipidemia well controlled on Lipitor, morbid obesity with a BMI of about 44, well-controlled hypertension, peripheral arterial disease status post right second toe amputation, heart failure with preserved ejection fraction.   She was seen 2 weeks ago with significant increased lower extremity edema, Lasix was increased from 40 daily to 40 twice daily.  She was asked to follow-up today.  She has been feeling well, no shortness of breath.  That day labs were drawn that showed stable renal function with a baseline creatinine around 1.7.  A1c had worsened to 9.3, she has a follow-up with her endocrinologist soon.  Blood pressure was a little bit above goal at that time.  She is requesting referral to a gynecologist.   Past Medical/Surgical History: Past Medical History:  Diagnosis Date  . Anemia of chronic disease 11/17/2012  . At risk for heart disease - +stres test 2012 followed by [facility], [place] with neg cardiac cath 03/2010 (non-obstructive CAD) per review of records, echo 02/2010 normal LVF 01/15/2012  . Chronic kidney disease (CKD), stage IV (severe) ([place]) 01/09/2012  . Diabetes mellitus with renal manifestations, uncontrolled ([place])   .  Diabetic neuropathy ([place]) 01/09/2012  . Diastolic heart failure - stage 1 per review of echo results from 02/2010 01/15/2012  . Hyperlipidemia   . Hypertension   . Obesity   . Peripheral arterial disease? Eval by vascular 2013 with no LE PAD and advised to stop pletal   . Severe obesity (BMI >= 40) ([place]) 11/13/2012  . Toe ulcer, right ([place])   . Ulcer of toe of left foot ([place])    treated by [facility] per patient  . Venous stasis of lower extremity 03/10/2012    Past Surgical History:  Procedure Laterality Date  . EYE SURGERY    . TOE AMPUTATION  2008    Social History:  reports that she quit smoking about 20 years ago. Her smoking use included cigarettes. She has never used smokeless tobacco. She reports current alcohol use. She reports current drug use.  Allergies: No Known Allergies  Family History:  Family History  Problem Relation Age of Onset  . Diabetes Mother   . Hyperlipidemia Mother   . Hypertension Mother   . Heart attack Mother   . Stroke Mother   . Diabetes Sister   . Hypertension Brother   . Diabetes Sister   . Diabetes Sister   . Breast cancer Sister   . Diabetes Sister   . Obesity Other   . Hypertension Other   . Hyperlipidemia Other   . Stroke Other   . Heart attack Other      Current Outpatient Medications:  .  atorvastatin (LIPITOR) 20 MG tablet, TAKE 1 TABLET BY MOUTH EVERY DAY, Disp: 90 tablet, Rfl: 1 .  Blood  Glucose Monitoring Suppl (FREESTYLE LITE) DEVI, Use to check sugar 2 times daily, Disp: 1 each, Rfl: 0 .  furosemide (LASIX) 40 MG tablet, TAKE 1 TABLET BY MOUTH TWICE A DAY, Disp: 180 tablet, Rfl: 1 .  glucose blood (FREESTYLE LITE) test strip, Use as instructed to check the sugar 3 times daily, Disp: 200 each, Rfl: 5 .  insulin aspart (NOVOLOG FLEXPEN) 100 UNIT/ML FlexPen, INJECT UP TO 90 UNITS DAILY AS INSTRUCTED., Disp: 15 pen, Rfl: 3 .  Insulin Glargine (BASAGLAR KWIKPEN) 100 UNIT/ML SOPN, 40 units q am and 20 units qpm, Disp: 72 pen,  Rfl: 2 .  Insulin Pen Needle (B-D UF III MINI PEN NEEDLES) 31G X 5 MM MISC, USE AS DIRECTED - 5X A DAY., Disp: 100 each, Rfl: 17 .  lisinopril (ZESTRIL) 10 MG tablet, TAKE 1 TABLET BY MOUTH EVERY DAY, Disp: 90 tablet, Rfl: 1 .  metoprolol succinate (TOPROL-XL) 25 MG 24 hr tablet, TAKE 1 TABLET BY MOUTH EVERY DAY, Disp: 90 tablet, Rfl: 1  Review of Systems:  Constitutional: Denies fever, chills, diaphoresis, appetite change and fatigue.  HEENT: Denies photophobia, eye pain, redness, hearing loss, ear pain, congestion, sore throat, rhinorrhea, sneezing, mouth sores, trouble swallowing, neck pain, neck stiffness and tinnitus.   Respiratory: Denies SOB, DOE, cough, chest tightness,  and wheezing.   Cardiovascular: Denies chest pain, palpitations. Gastrointestinal: Denies nausea, vomiting, abdominal pain, diarrhea, constipation, blood in stool and abdominal distention.  Genitourinary: Denies dysuria, urgency, frequency, hematuria, flank pain and difficulty urinating.  Endocrine: Denies: hot or cold intolerance, sweats, changes in hair or nails, polyuria, polydipsia. Musculoskeletal: Denies myalgias, back pain, joint swelling, arthralgias and gait problem.  Skin: Denies pallor, rash and wound.  Neurological: Denies dizziness, seizures, syncope, weakness, light-headedness, numbness and headaches.  Hematological: Denies adenopathy. Easy bruising, personal or family bleeding history  Psychiatric/Behavioral: Denies suicidal ideation, mood changes, confusion, nervousness, sleep disturbance and agitation    Physical Exam: Vitals:   12/22/19 0937  BP: 130/68  Pulse: 75  Temp: 98.6 F (37 C)  TempSrc: Oral  SpO2: 98%  Weight: (!) 311 lb 1.6 oz (141.1 kg)    Body mass index is 44.01 kg/m.  Constitutional: NAD, calm, comfortable Eyes: PERRL, lids and conjunctivae normal, wears corrective lenses ENMT: Mucous membranes are moist.  Respiratory: clear to auscultation bilaterally, no wheezing, no  crackles. Normal respiratory effort. No accessory muscle use.  Cardiovascular: Regular rate and rhythm, no murmurs / rubs / gallops.  1+ pitting lower extremity edema bilaterally. Neurologic: Grossly intact and nonfocal Psychiatric: Normal judgment and insight. Alert and oriented x 3. Normal mood.    Impression and Plan:  Acute on chronic diastolic heart failure ([place]) -Improved with decreased lower extremity edema. -She will continue to take Lasix at her prescribed dose of 40 mg twice daily, she is already on lisinopril and metoprolol.  She also takes a statin. -Follow-up in 3 months, have advised continued use of compression stockings which she is today.  Screening for cervical cancer  - Plan: Ambulatory referral to Obstetrics / Gynecology  Chronic kidney disease (CKD), stage IV (severe) (HCC) -Stable, baseline creatinine around 1.7, she has a nephrologist.  Hyperlipidemia associated with type 2 diabetes mellitus ([place]) -Last LDL was 65 earlier this month, continue atorvastatin 20 mg daily.  Uncontrolled type 2 diabetes mellitus with stage 3 chronic kidney disease, with long-term current use of insulin ([place]) -With a recent A1c of 9.3, she follows with endocrinology.  Severe obesity (BMI >= 40) (HCC) -Discussed  healthy lifestyle, including increased physical activity and better food choices to promote weight loss.  Need for pneumococcal vaccination -PPSV23 administered today.    Patient Instructions  -Nice seeing you today!!  -Pneumonia vaccine today.  -Schedule follow up in 3 months.     Lelon Frohlich, MD [facility]

## 2019-12-22 NOTE — Patient Instructions (Signed)
-  Nice seeing you today!!  -Pneumonia vaccine today.  -Schedule follow up in 3 months.

## 2019-12-28 ENCOUNTER — Other Ambulatory Visit: Payer: Self-pay

## 2019-12-29 ENCOUNTER — Encounter: Payer: Self-pay | Admitting: Internal Medicine

## 2019-12-29 ENCOUNTER — Ambulatory Visit: Payer: BLUE CROSS/BLUE SHIELD | Admitting: Internal Medicine

## 2019-12-29 VITALS — BP 122/72 | HR 74 | Ht 70.0 in | Wt 311.0 lb

## 2019-12-29 DIAGNOSIS — E1122 Type 2 diabetes mellitus with diabetic chronic kidney disease: Secondary | ICD-10-CM | POA: Diagnosis not present

## 2019-12-29 DIAGNOSIS — E1169 Type 2 diabetes mellitus with other specified complication: Secondary | ICD-10-CM | POA: Diagnosis not present

## 2019-12-29 DIAGNOSIS — E785 Hyperlipidemia, unspecified: Secondary | ICD-10-CM

## 2019-12-29 DIAGNOSIS — E1165 Type 2 diabetes mellitus with hyperglycemia: Secondary | ICD-10-CM

## 2019-12-29 DIAGNOSIS — IMO0002 Reserved for concepts with insufficient information to code with codable children: Secondary | ICD-10-CM

## 2019-12-29 DIAGNOSIS — Z794 Long term (current) use of insulin: Secondary | ICD-10-CM

## 2019-12-29 DIAGNOSIS — N183 Chronic kidney disease, stage 3 unspecified: Secondary | ICD-10-CM

## 2019-12-29 NOTE — Patient Instructions (Addendum)
Please continue to try to take consistently: - Basaglar 40 units in a.m. and 30 units at night - Novolog dose: (25) - (15-20) - (15-20) units before meals - Novolog Sliding scale: - 150- 165: + 1 unit  - 166- 180: + 2 units  - 181- 195: + 3 units  - 196- 210: + 4 units  - >210: + 5 units  Take NovoLog 15 minutes before every meal.  Please return in 4 months with your sugar log.

## 2019-12-29 NOTE — Progress Notes (Signed)
Subjective:     Patient ID: Savannah Crawford, female   DOB: [birth year], 64 y.o.   MRN: [id number]  This visit occurred during the SARS-CoV-2 public health emergency.  Safety protocols were in place, including screening questions prior to the visit, additional usage of staff PPE, and extensive cleaning of exam room while observing appropriate contact time as indicated for disinfecting solutions.   HPI Savannah Crawford is a 64 y.o. woman returning for f/u for DM2, insulin-dependent, uncontrolled, with complications (CAD, CKD, diabetes neuropathy, diabetic retinopathy, PVD, amputated toe-R). Last visit 4 months ago.  Patient has diabetes control is hindered by not checking sugars frequently and not taking medications as prescribed.  In the last month, she has been trying harder not to miss insulin doses and sugars improved significantly.  Reviewed HbA1c levels: Lab Results  Component Value Date   HGBA1C 9.3 (H) 12/04/2019   HGBA1C 8.8 (A) 09/03/2019   HGBA1C 8.5 (A) 04/02/2019   She is on: - Basaglar 40 units in the a.m. and 20-30 >> 30 units at night  - Novolog dose: 25-20-25 >> (25) - (15-20) - (15-20) units before meals  - Novolog Sliding scale: - 150- 165: + 1 unit  - 166- 180: + 2 units  - 181- 195: + 3 units  - 196- 210: + 4 units  - >210: + 5 units  I suggested Ozempic 0.5 mg weekly in the past, also: 11/2018 >> did not start as she did not want to add another med... In 03/2019, she did try to start it but this was expensive. She was on metformin in the past, but stopped 2/2 CKD.  Tried Victoza before >> cannot remember SE.  She was checking her sugars seldom (at last visit, she had only few readings and her time was wrong on her glucometer) -now checking once a day in the last 2 weeks  - date and time is still wrong): - am: 192, 220 >> 98, 112-178 >> 151, 205 >> 98-146, 220 - forgot insulin - after b'fast: 128-183 >> n/c - prelunch:185 >> 170-180 >> n/c - after lunch: 236 - predinner: 79,  122-166, 204 >> 230-240 >> 144, 153 >> n/c - after dinner: 77 - bedtime: 294 >> n/c >> 135-217, 366 >> n/c  Lowest: 68 >> 98 >> 144 >> 77 Highest sugar 250 >> 240 >> 205 >> 220  Meter: Freestyle Lite  + CKD-sees nephrology: Lab Results  Component Value Date   BUN 26 (H) 12/04/2019   CREATININE 1.74 (H) 12/04/2019  On lisinopril 10.  + HL; Latest lipids: Lab Results  Component Value Date   CHOL 136 12/04/2019   HDL 55 12/04/2019   LDLCALC 65 12/04/2019   TRIG 79 12/04/2019   CHOLHDL 2.5 12/04/2019  On Lipitor 20.  Last eye exam 02/2019: + DR, + cataract reportedly  + Stable numbness and tingling in her feet.  She sees podiatry (Dr. Melony Overly).  She has a history of left third toe ulcer.  She has a history of nonobstructive CAD, with positive stress test but a negative cardiac cath 2012, and also has diastolic heart failure. She has HTN, venous stasis, vitamin D deficiency.  She continues to see podiatry.  She has a history of skin graft fourth toe ulcer  She denies family history of medullary thyroid cancer and no personal history of pancreatitis.  Review of Systems Constitutional: no weight gain/no weight loss, no fatigue, no subjective hyperthermia, no subjective hypothermia Eyes: no blurry vision, no xerophthalmia  ENT: no sore throat, no nodules palpated in neck, no dysphagia, no odynophagia, no hoarseness Cardiovascular: no CP/no SOB/no palpitations/no leg swelling Respiratory: no cough/no SOB/no wheezing Gastrointestinal: no N/no V/no D/no C/no acid reflux Musculoskeletal: no muscle aches/no joint aches Skin: no rashes, no hair loss Neurological: no tremors/+ numbness/+ tingling/no dizziness  I reviewed pt's medications, allergies, PMH, social hx, family hx, and changes were documented in the history of present illness. Otherwise, unchanged from my initial visit note.  Past Medical History:  Diagnosis Date  . Anemia of chronic disease 11/17/2012  . At risk for heart  disease - +stres test 2012 followed by [facility], [place] with neg cardiac cath 03/2010 (non-obstructive CAD) per review of records, echo 02/2010 normal LVF 01/15/2012  . Chronic kidney disease (CKD), stage IV (severe) ([place]) 01/09/2012  . Diabetes mellitus with renal manifestations, uncontrolled ([place])   . Diabetic neuropathy ([place]) 01/09/2012  . Diastolic heart failure - stage 1 per review of echo results from 02/2010 01/15/2012  . Hyperlipidemia   . Hypertension   . Obesity   . Peripheral arterial disease? Eval by vascular 2013 with no LE PAD and advised to stop pletal   . Severe obesity (BMI >= 40) ([place]) 11/13/2012  . Toe ulcer, right ([place])   . Ulcer of toe of left foot ([place])    treated by [facility] per patient  . Venous stasis of lower extremity 03/10/2012   Past Surgical History:  Procedure Laterality Date  . EYE SURGERY    . TOE AMPUTATION  2008   Social History   Socioeconomic History  . Marital status: Married    Spouse name: Not on file  . Number of children: Not on file  . Years of education: Not on file  . Highest education level: Not on file  Occupational History  . Not on file  Tobacco Use  . Smoking status: Former Smoker    Types: Cigarettes    Quit date: 02/20/1999    Years since quitting: 20.8  . Smokeless tobacco: Never Used  Substance and Sexual Activity  . Alcohol use: Yes  . Drug use: Yes  . Sexual activity: Not on file  Other Topics Concern  . Not on file  Social History Narrative  . Not on file   Social Determinants of Health   Financial Resource Strain:   . Difficulty of Paying Living Expenses: Not on file  Food Insecurity:   . Worried About Charity fundraiser in the Last Year: Not on file  . Ran Out of Food in the Last Year: Not on file  Transportation Needs:   . Lack of Transportation (Medical): Not on file  . Lack of Transportation (Non-Medical): Not on file  Physical Activity:   . Days of Exercise per Week: Not on file  . Minutes  of Exercise per Session: Not on file  Stress:   . Feeling of Stress : Not on file  Social Connections:   . Frequency of Communication with Friends and Family: Not on file  . Frequency of Social Gatherings with Friends and Family: Not on file  . Attends Religious Services: Not on file  . Active Member of Clubs or Organizations: Not on file  . Attends Archivist Meetings: Not on file  . Marital Status: Not on file  Intimate Partner Violence:   . Fear of Current or Ex-Partner: Not on file  . Emotionally Abused: Not on file  . Physically Abused: Not on file  .  Sexually Abused: Not on file   Current Outpatient Medications on File Prior to Visit  Medication Sig Dispense Refill  . atorvastatin (LIPITOR) 20 MG tablet TAKE 1 TABLET BY MOUTH EVERY DAY 90 tablet 1  . Blood Glucose Monitoring Suppl (FREESTYLE LITE) DEVI Use to check sugar 2 times daily 1 each 0  . furosemide (LASIX) 40 MG tablet TAKE 1 TABLET BY MOUTH TWICE A DAY 180 tablet 1  . glucose blood (FREESTYLE LITE) test strip Use as instructed to check the sugar 3 times daily 200 each 5  . insulin aspart (NOVOLOG FLEXPEN) 100 UNIT/ML FlexPen INJECT UP TO 90 UNITS DAILY AS INSTRUCTED. 15 pen 3  . Insulin Glargine (BASAGLAR KWIKPEN) 100 UNIT/ML SOPN 40 units q am and 20 units qpm 72 pen 2  . Insulin Pen Needle (B-D UF III MINI PEN NEEDLES) 31G X 5 MM MISC USE AS DIRECTED - 5X A DAY. 100 each 17  . lisinopril (ZESTRIL) 10 MG tablet TAKE 1 TABLET BY MOUTH EVERY DAY 90 tablet 1  . metoprolol succinate (TOPROL-XL) 25 MG 24 hr tablet TAKE 1 TABLET BY MOUTH EVERY DAY 90 tablet 1   No current facility-administered medications on file prior to visit.   No Known Allergies Family History  Problem Relation Age of Onset  . Diabetes Mother   . Hyperlipidemia Mother   . Hypertension Mother   . Heart attack Mother   . Stroke Mother   . Diabetes Sister   . Hypertension Brother   . Diabetes Sister   . Diabetes Sister   . Breast cancer  Sister   . Diabetes Sister   . Obesity Other   . Hypertension Other   . Hyperlipidemia Other   . Stroke Other   . Heart attack Other    Objective:   Physical Exam BP 122/72   Pulse 74   Ht 5\' 10"  (1.778 m)   Wt (!) 311 lb (141.1 kg)   SpO2 97%   BMI 44.62 kg/m  Body mass index is 44.62 kg/m. Wt Readings from Last 3 Encounters:  12/29/19 (!) 311 lb (141.1 kg)  12/22/19 (!) 311 lb 1.6 oz (141.1 kg)  12/04/19 (!) 309 lb (140.2 kg)   Constitutional: overweight, in NAD Eyes: PERRLA, EOMI, no exophthalmos ENT: moist mucous membranes, no thyromegaly, no cervical lymphadenopathy Cardiovascular: RRR, No MRG, + B LE edema L>R Respiratory: CTA B Gastrointestinal: abdomen soft, NT, ND, BS+ Musculoskeletal: no deformities, strength intact in all 4 Skin: moist, warm, no rashes Neurological: no tremor with outstretched hands, DTR normal in all 4  Assessment:     1. DM2, insulin-dependent, uncontrolled, with complications - nonobstructive CAD - CKD - diabetes neuropathy - diabetic retinopathy - PVD - R amputated toe  2. HL  3. Obesity class 3    Plan:     1. Pt with history of uncontrolled diabetes, on basal-bolus insulin regimen, to which I advised her to add a GLP-1 receptor agonist in the past.  At last visit, she was not checking sugars consistently so it was difficult to discern CBG patterns.  Also, she could not afford Ozempic.  Moreover, she was not taking the insulin as prescribed, missing many insulin doses.  I suggested alarms on her phone, if needed.  HbA1c at last visit was higher, at 8.8%.  However, she had another HbA1c last month and this was even higher, at 9.3%. -We are limited in our medication choices due to her history of CKD. -At today's visit, reviewing  her meter, the date and time is wrong.  I advised her to check with pharmacy to help her change these. -However, she did check sugars in the last 2 weeks and they are almost all at goal with the exception of  one high blood sugar at 220, when she forgot to take the insulin the night before.  This is a significant improvement in her blood sugars and I strongly advised her to continue and to set alarms to take the insulin if she may forget to take them.  However, I do not have a reason to change her regimen for now for her. -I am hoping that Ozempic may be covered for her in the new year.  Discussed about benefits of adding this to her regimen. -  I advised her to: Patient Instructions  Please continue to try to take consistently: - Basaglar 40 units in a.m. and 30 units at night - Novolog dose: (25) - (15-20) - (15-20) units before meals - Novolog Sliding scale: - 150- 165: + 1 unit  - 166- 180: + 2 units  - 181- 195: + 3 units  - 196- 210: + 4 units  - >210: + 5 units  Take NovoLog 15 minutes before every meal.  Please return in 4 months with your sugar log.   - advised to check sugars at different times of the day - 3-4x a day, rotating check times - advised for yearly eye exams >> she is UTD - return to clinic in 4 months  2. HL -Reviewed latest lipid panel from 11/2019: All fractions at goal: Lab Results  Component Value Date   CHOL 136 12/04/2019   HDL 55 12/04/2019   LDLCALC 65 12/04/2019   TRIG 79 12/04/2019   CHOLHDL 2.5 12/04/2019  -Continues Lipitor 20, without side effects  3.  Obesity class III -Unfortunately, she could not afford the GLP-1 receptor agonist which would have helped with weight loss.  We will try again to prescribe this in the new year. -She lost approximately 3 pounds before last visit but weight stable since then  Philemon Kingdom, MD PhD [facility]

## 2020-01-21 ENCOUNTER — Encounter: Payer: BLUE CROSS/BLUE SHIELD | Admitting: Gastroenterology

## 2020-01-27 ENCOUNTER — Ambulatory Visit (AMBULATORY_SURGERY_CENTER): Payer: Self-pay

## 2020-01-27 ENCOUNTER — Other Ambulatory Visit: Payer: Self-pay

## 2020-01-27 VITALS — Ht 70.0 in | Wt 312.0 lb

## 2020-01-27 DIAGNOSIS — Z1211 Encounter for screening for malignant neoplasm of colon: Secondary | ICD-10-CM

## 2020-01-27 MED ORDER — PLENVU 140 G PO SOLR: 1.0000 | ORAL | 0 refills | Status: DC

## 2020-01-27 NOTE — Progress Notes (Signed)
No egg or soy allergy known to patient  No issues with past sedation with any surgeries or procedures No intubation problems in the past  No FH of Malignant Hyperthermia No diet pills per patient No home 02 use per patient  No blood thinners per patient  Pt denies issues with constipation  No A fib or A flutter  COVID 19 guidelines implemented in PV today with Pt and RN  Coupon given to pt in PV today , Code to Pharmacy  COVID vaccines completed on 05/2019 per pt;  Due to the COVID-19 pandemic we are asking patients to follow these guidelines. Please only bring one care partner. Please be aware that your care partner may wait in the car in the parking lot or if they feel like they will be too hot to wait in the car, they may wait in the lobby on the 4th floor. All care partners are required to wear a mask the entire time (we do not have any that we can provide them), they need to practice social distancing, and we will do a Covid check for all patient's and care partners when you arrive. Also we will check their temperature and your temperature. If the care partner waits in their car they need to stay in the parking lot the entire time and we will call them on their cell phone when the patient is ready for discharge so they can bring the car to the front of the building. Also all patient's will need to wear a mask into building.

## 2020-01-29 ENCOUNTER — Other Ambulatory Visit: Payer: Self-pay | Admitting: Internal Medicine

## 2020-01-29 DIAGNOSIS — E1159 Type 2 diabetes mellitus with other circulatory complications: Secondary | ICD-10-CM

## 2020-01-29 DIAGNOSIS — E1122 Type 2 diabetes mellitus with diabetic chronic kidney disease: Secondary | ICD-10-CM

## 2020-01-29 DIAGNOSIS — IMO0002 Reserved for concepts with insufficient information to code with codable children: Secondary | ICD-10-CM

## 2020-02-10 ENCOUNTER — Telehealth: Payer: Self-pay | Admitting: Gastroenterology

## 2020-02-10 NOTE — Telephone Encounter (Signed)
Good morning!  Patient calling states on 02/09/20 at 7:00pm she ate a caesar salad. Patient is scheduled for colon on 02/11/20 9:00am  Dr. Bryan Lemma.. Pt wants to know should she take prep?  Please advise  Thank you

## 2020-02-10 NOTE — Telephone Encounter (Signed)
Spoke to patient to inform her that it was okay to proceed with colonoscopy on 02/11/20 despite eating a salad on 02/09/20. Patient voiced understanding.

## 2020-02-11 ENCOUNTER — Other Ambulatory Visit: Payer: Self-pay

## 2020-02-11 ENCOUNTER — Ambulatory Visit (AMBULATORY_SURGERY_CENTER): Payer: BLUE CROSS/BLUE SHIELD | Admitting: Gastroenterology

## 2020-02-11 ENCOUNTER — Encounter: Payer: Self-pay | Admitting: Gastroenterology

## 2020-02-11 VITALS — BP 142/61 | HR 65 | Temp 96.9°F | Resp 17 | Ht 70.0 in | Wt 312.0 lb

## 2020-02-11 DIAGNOSIS — K640 First degree hemorrhoids: Secondary | ICD-10-CM

## 2020-02-11 DIAGNOSIS — Z1211 Encounter for screening for malignant neoplasm of colon: Secondary | ICD-10-CM | POA: Diagnosis not present

## 2020-02-11 DIAGNOSIS — K5730 Diverticulosis of large intestine without perforation or abscess without bleeding: Secondary | ICD-10-CM

## 2020-02-11 MED ORDER — SODIUM CHLORIDE 0.9 % IV SOLN: 500.0000 mL | INTRAVENOUS | Status: DC

## 2020-02-11 NOTE — Patient Instructions (Signed)
Handout given: diverticulosis, hemorrhoids Resume previous diet Continue current medications Repeat colonoscopy in 10 years  YOU HAD AN ENDOSCOPIC PROCEDURE TODAY AT [facility]:   Refer to the procedure report that was given to you for any specific questions about what was found during the examination.  If the procedure report does not answer your questions, please call your gastroenterologist to clarify.  If you requested that your care partner not be given the details of your procedure findings, then the procedure report has been included in a sealed envelope for you to review at your convenience later.  YOU SHOULD EXPECT: Some feelings of bloating in the abdomen. Passage of more gas than usual.  Walking can help get rid of the air that was put into your GI tract during the procedure and reduce the bloating. If you had a lower endoscopy (such as a colonoscopy or flexible sigmoidoscopy) you may notice spotting of blood in your stool or on the toilet paper. If you underwent a bowel prep for your procedure, you may not have a normal bowel movement for a few days.  Please Note:  You might notice some irritation and congestion in your nose or some drainage.  This is from the oxygen used during your procedure.  There is no need for concern and it should clear up in a day or so.  SYMPTOMS TO REPORT IMMEDIATELY:   Following lower endoscopy (colonoscopy or flexible sigmoidoscopy):  Excessive amounts of blood in the stool  Significant tenderness or worsening of abdominal pains  Swelling of the abdomen that is new, acute  Fever of 100F or higher  For urgent or emergent issues, a gastroenterologist can be reached at any hour by calling [phone]. Do not use MyChart messaging for urgent concerns.   DIET:  We do recommend a small meal at first, but then you may proceed to your regular diet.  Drink plenty of fluids but you should avoid alcoholic beverages for 24 hours.  ACTIVITY:   You should plan to take it easy for the rest of today and you should NOT DRIVE or use heavy machinery until tomorrow (because of the sedation medicines used during the test).    FOLLOW UP: Our staff will call the number listed on your records 48-72 hours following your procedure to check on you and address any questions or concerns that you may have regarding the information given to you following your procedure. If we do not reach you, we will leave a message.  We will attempt to reach you two times.  During this call, we will ask if you have developed any symptoms of COVID 19. If you develop any symptoms (ie: fever, flu-like symptoms, shortness of breath, cough etc.) before then, please call [phone].  If you test positive for Covid 19 in the 2 weeks post procedure, please call and report this information to [place].    If any biopsies were taken you will be contacted by phone or by letter within the next 1-3 weeks.  Please call us at [phone] if you have not heard about the biopsies in 3 weeks.   SIGNATURES/CONFIDENTIALITY: You and/or your care partner have signed paperwork which will be entered into your electronic medical record.  These signatures attest to the fact that that the information above on your After Visit Summary has been reviewed and is understood.  Full responsibility of the confidentiality of this discharge information lies with you and/or your care-partner.

## 2020-02-11 NOTE — Progress Notes (Signed)
A/ox3, pleased with MAC, report to RN 

## 2020-02-11 NOTE — Op Note (Signed)
[facility] Patient Name: Savannah Crawford Procedure Date: 02/11/2020 9:18 AM MRN: [id number] Endoscopist: Gerrit Heck , MD Age: 64 Referring MD:  Date of Birth: 06-15-1955 Gender: Female Account #: [id number] Procedure:                Colonoscopy Indications:              Screening for colorectal malignant neoplasm                           Last colonoscopy was at outside facility and normal                            per patient. No known family history of colon                            cancer. Medicines:                Monitored Anesthesia Care Procedure:                Pre-Anesthesia Assessment:                           - Prior to the procedure, a History and Physical                            was performed, and patient medications and                            allergies were reviewed. The patient's tolerance of                            previous anesthesia was also reviewed. The risks                            and benefits of the procedure and the sedation                            options and risks were discussed with the patient.                            All questions were answered, and informed consent                            was obtained. Prior Anticoagulants: The patient has                            taken no previous anticoagulant or antiplatelet                            agents. ASA Grade Assessment: III - A patient with                            severe systemic disease. After reviewing the risks  and benefits, the patient was deemed in                            satisfactory condition to undergo the procedure.                           After obtaining informed consent, the colonoscope                            was passed under direct vision. Throughout the                            procedure, the patient's blood pressure, pulse, and                            oxygen saturations were monitored continuously. The                             Olympus PFC-[id number] (#[id number]) Colonoscope was                            introduced through the anus and advanced to the the                            cecum, identified by appendiceal orifice and                            ileocecal valve. The colonoscopy was performed                            without difficulty. The patient tolerated the                            procedure well. The quality of the bowel                            preparation was good. The ileocecal valve,                            appendiceal orifice, and rectum were photographed. Scope In: 9:24:29 AM Scope Out: 9:39:27 AM Scope Withdrawal Time: 0 hours 9 minutes 29 seconds  Total Procedure Duration: 0 hours 14 minutes 58 seconds  Findings:                 The perianal and digital rectal examinations were                            normal.                           The ascending colon revealed moderately excessive                            looping. Advancing the scope required using manual  pressure.                           A few small-mouthed diverticula were found in the                            sigmoid colon.                           The exam was otherwise normal throughout the                            remainder of the colon.                           Non-bleeding internal hemorrhoids were found during                            retroflexion. The hemorrhoids were small. Complications:            No immediate complications. Estimated Blood Loss:     Estimated blood loss: none. Impression:               - There was significant looping of the colon.                           - Diverticulosis in the sigmoid colon.                           - Non-bleeding internal hemorrhoids.                           - No specimens collected. Recommendation:           - Patient has a contact number available for                            emergencies. The signs and symptoms of  potential                            delayed complications were discussed with the                            patient. Return to normal activities tomorrow.                            Written discharge instructions were provided to the                            patient.                           - Resume previous diet.                           - Continue present medications.                           -  Repeat colonoscopy in 10 years for screening                            purposes.                           - Return to GI clinic PRN. Gerrit Heck, MD 02/11/2020 9:44:39 AM

## 2020-02-15 ENCOUNTER — Telehealth: Payer: Self-pay | Admitting: *Deleted

## 2020-02-15 NOTE — Telephone Encounter (Signed)
No answer for post procedure call back. Left message for patient to call back with questions or concerns.

## 2020-03-12 ENCOUNTER — Other Ambulatory Visit: Payer: Self-pay | Admitting: Internal Medicine

## 2020-03-12 DIAGNOSIS — E1122 Type 2 diabetes mellitus with diabetic chronic kidney disease: Secondary | ICD-10-CM

## 2020-03-12 DIAGNOSIS — IMO0002 Reserved for concepts with insufficient information to code with codable children: Secondary | ICD-10-CM

## 2020-03-16 ENCOUNTER — Other Ambulatory Visit: Payer: Self-pay | Admitting: Internal Medicine

## 2020-03-16 ENCOUNTER — Other Ambulatory Visit: Payer: Self-pay | Admitting: *Deleted

## 2020-03-16 ENCOUNTER — Telehealth: Payer: Self-pay | Admitting: Internal Medicine

## 2020-03-16 DIAGNOSIS — E1165 Type 2 diabetes mellitus with hyperglycemia: Secondary | ICD-10-CM

## 2020-03-16 DIAGNOSIS — IMO0002 Reserved for concepts with insufficient information to code with codable children: Secondary | ICD-10-CM

## 2020-03-16 MED ORDER — BASAGLAR KWIKPEN 100 UNIT/ML ~~LOC~~ SOPN: PEN_INJECTOR | SUBCUTANEOUS | 1 refills | Status: DC

## 2020-03-16 MED ORDER — NOVOLOG FLEXPEN 100 UNIT/ML ~~LOC~~ SOPN: PEN_INJECTOR | SUBCUTANEOUS | 1 refills | Status: DC

## 2020-03-16 NOTE — Telephone Encounter (Signed)
Refill sent to preferred pharmacy. 

## 2020-03-16 NOTE — Telephone Encounter (Signed)
Pt called to request a refill for her Novolog and Basaglar.  PHARMACY:  [id number] [place] [address]:  [phone] Fax:  [phone]

## 2020-04-13 ENCOUNTER — Ambulatory Visit: Payer: BLUE CROSS/BLUE SHIELD | Admitting: Internal Medicine

## 2020-04-13 ENCOUNTER — Other Ambulatory Visit: Payer: Self-pay

## 2020-04-13 VITALS — BP 140/70 | HR 72 | Temp 98.3°F | Wt 313.1 lb

## 2020-04-13 DIAGNOSIS — Z794 Long term (current) use of insulin: Secondary | ICD-10-CM | POA: Diagnosis not present

## 2020-04-13 DIAGNOSIS — N390 Urinary tract infection, site not specified: Secondary | ICD-10-CM

## 2020-04-13 DIAGNOSIS — IMO0002 Reserved for concepts with insufficient information to code with codable children: Secondary | ICD-10-CM

## 2020-04-13 DIAGNOSIS — A4150 Gram-negative sepsis, unspecified: Secondary | ICD-10-CM | POA: Diagnosis not present

## 2020-04-13 DIAGNOSIS — Z124 Encounter for screening for malignant neoplasm of cervix: Secondary | ICD-10-CM

## 2020-04-13 DIAGNOSIS — E1122 Type 2 diabetes mellitus with diabetic chronic kidney disease: Secondary | ICD-10-CM

## 2020-04-13 DIAGNOSIS — N183 Chronic kidney disease, stage 3 unspecified: Secondary | ICD-10-CM

## 2020-04-13 DIAGNOSIS — E1165 Type 2 diabetes mellitus with hyperglycemia: Secondary | ICD-10-CM

## 2020-04-13 DIAGNOSIS — Z09 Encounter for follow-up examination after completed treatment for conditions other than malignant neoplasm: Secondary | ICD-10-CM

## 2020-04-13 LAB — COMPREHENSIVE METABOLIC PANEL
ALT: 21 U/L (ref 0–35)
AST: 14 U/L (ref 0–37)
Albumin: 3.7 g/dL (ref 3.5–5.2)
Alkaline Phosphatase: 140 U/L — ABNORMAL HIGH (ref 39–117)
BUN: 30 mg/dL — ABNORMAL HIGH (ref 6–23)
CO2: 29 mEq/L (ref 19–32)
Calcium: 9 mg/dL (ref 8.4–10.5)
Chloride: 103 mEq/L (ref 96–112)
Creatinine, Ser: 1.91 mg/dL — ABNORMAL HIGH (ref 0.40–1.20)
GFR: 27.41 mL/min — ABNORMAL LOW (ref >60.00)
Glucose, Bld: 176 mg/dL — ABNORMAL HIGH (ref 70–99)
Potassium: 4.5 mEq/L (ref 3.5–5.1)
Sodium: 138 mEq/L (ref 135–145)
Total Bilirubin: 0.4 mg/dL (ref 0.2–1.2)
Total Protein: 7.2 g/dL (ref 6.0–8.3)

## 2020-04-13 LAB — CBC WITH DIFFERENTIAL/PLATELET
Basophils Absolute: 0.1 10*3/uL (ref 0.0–0.1)
Basophils Relative: 0.9 % (ref 0.0–3.0)
Eosinophils Absolute: 0.3 10*3/uL (ref 0.0–0.7)
Eosinophils Relative: 2.8 % (ref 0.0–5.0)
HCT: 33.9 % — ABNORMAL LOW (ref 36.0–46.0)
Hemoglobin: 10.9 g/dL — ABNORMAL LOW (ref 12.0–15.0)
Lymphocytes Relative: 24.2 % (ref 12.0–46.0)
Lymphs Abs: 2.3 10*3/uL (ref 0.7–4.0)
MCHC: 32 g/dL (ref 30.0–36.0)
MCV: 83.9 fl (ref 78.0–100.0)
Monocytes Absolute: 0.8 10*3/uL (ref 0.1–1.0)
Monocytes Relative: 8.9 % (ref 3.0–12.0)
Neutro Abs: 6 10*3/uL (ref 1.4–7.7)
Neutrophils Relative %: 63.2 % (ref 43.0–77.0)
Platelets: 425 10*3/uL — ABNORMAL HIGH (ref 150.0–400.0)
RBC: 4.04 Mil/uL (ref 3.87–5.11)
RDW: 14.7 % (ref 11.5–15.5)
WBC: 9.4 10*3/uL (ref 4.0–10.5)

## 2020-04-13 LAB — POCT GLYCOSYLATED HEMOGLOBIN (HGB A1C): Hemoglobin A1C: 9.3 % — AB (ref 4.0–5.6)

## 2020-04-13 NOTE — Addendum Note (Signed)
Addended by: Westley Hummer B on: 04/13/2020 11:05 AM   Modules accepted: Orders

## 2020-04-13 NOTE — Progress Notes (Signed)
Established Patient Office Visit     This visit occurred during the SARS-CoV-2 public health emergency.  Safety protocols were in place, including screening questions prior to the visit, additional usage of staff PPE, and extensive cleaning of exam room while observing appropriate contact time as indicated for disinfecting solutions.    CC/Reason for Visit: Hospital follow-up  HPI: Savannah Crawford is a 65 y.o. female who is coming in today for the above mentioned reasons. Past Medical History is significant for: insulin-dependent diabetes has not been well controlled followed by Dr. Garner Nash kidney disease stage IV with a baseline creatinine around 1.72 followed by nephrologist out of [facility], hyperlipidemia well controlled on Lipitor, morbid obesity with a BMI of about 44, well-controlled hypertension, peripheral arterial disease status post right second toe amputation, heart failure with preserved ejection fraction.  She was hospitalized at [facility] from February 11 through February 13 due to E. coli sepsis secondary to UTI.  She received IV fluids, IV Rocephin and was then transitioned to ciprofloxacin at discharge that she has now completed.  She feels improved, weakness is improving.  She has no acute complaints today.  She would like a referral to GYN.  Her A1c has been uncontrolled.  She has follow-up with Dr. Cruzita Lederer soon.  Her A1c today is 9.3.   Past Medical/Surgical History: Past Medical History:  Diagnosis Date  . Anemia of chronic disease 11/17/2012   not on meds at this time  . At risk for heart disease - +stres test 2012 followed by [facility], [place] with neg cardiac cath 03/2010 (non-obstructive CAD) per review of records, echo 02/2010 normal LVF 01/15/2012  . Chronic kidney disease (CKD), stage IV (severe) ([place]) 01/09/2012  . Diabetes mellitus with renal manifestations, uncontrolled ([place])    on meds  . Diabetic neuropathy ([place]) 01/09/2012  . Diastolic  heart failure - stage 1 per review of echo results from 02/2010 01/15/2012  . GERD (gastroesophageal reflux disease)    OTC meds for tx  . Hyperlipidemia    on meds  . Hypertension    on meds  . Obesity   . Peripheral arterial disease? Eval by vascular 2013 with no LE PAD and advised to stop pletal   . Severe obesity (BMI >= 40) ([place]) 11/13/2012  . Toe ulcer, right ([place])   . Ulcer of toe of left foot ([place])    treated by [facility] per patient  . Venous stasis of lower extremity 03/10/2012    Past Surgical History:  Procedure Laterality Date  . COLONOSCOPY  2013/2014   [place]- records not available   . REFRACTIVE SURGERY Bilateral   . TOE AMPUTATION Right 2008   RIGHT     Social History:  reports that she quit smoking about 21 years ago. Her smoking use included cigarettes. She has never used smokeless tobacco. She reports current alcohol use of about 4.0 standard drinks of alcohol per week. She reports previous drug use.  Allergies: No Known Allergies  Family History:  Family History  Problem Relation Age of Onset  . Diabetes Mother   . Hyperlipidemia Mother   . Hypertension Mother   . Heart attack Mother   . Stroke Mother   . Diabetes Sister   . Breast cancer Sister 27  . Hypertension Brother   . Diabetes Sister   . Diabetes Sister   . Obesity Other   . Hypertension Other   . Hyperlipidemia Other   . Stroke Other   .  Heart attack Other   . Colon polyps Neg Hx   . Colon cancer Neg Hx   . Esophageal cancer Neg Hx   . Rectal cancer Neg Hx   . Stomach cancer Neg Hx      Current Outpatient Medications:  .  atorvastatin (LIPITOR) 20 MG tablet, TAKE 1 TABLET BY MOUTH EVERY DAY, Disp: 90 tablet, Rfl: 1 .  Blood Glucose Monitoring Suppl (FREESTYLE LITE) DEVI, Use to check sugar 2 times daily, Disp: 1 each, Rfl: 0 .  furosemide (LASIX) 40 MG tablet, TAKE 1 TABLET BY MOUTH TWICE A DAY, Disp: 180 tablet, Rfl: 1 .  glucose blood (FREESTYLE LITE) test strip, Use as  instructed to check the sugar 3 times daily, Disp: 200 each, Rfl: 5 .  insulin aspart (NOVOLOG FLEXPEN) 100 UNIT/ML FlexPen, Inject up to 90 units daily as instructed., Disp: 60 mL, Rfl: 1 .  Insulin Glargine (BASAGLAR KWIKPEN) 100 UNIT/ML, INJECT UP TO 80 UNITS A DAY AT BEDTIME, Disp: 72 mL, Rfl: 1 .  Insulin Pen Needle (B-D UF III MINI PEN NEEDLES) 31G X 5 MM MISC, USE AS DIRECTED - 5X A DAY., Disp: 100 each, Rfl: 17 .  lisinopril (ZESTRIL) 10 MG tablet, TAKE 1 TABLET BY MOUTH EVERY DAY, Disp: 90 tablet, Rfl: 1 .  metoprolol succinate (TOPROL-XL) 25 MG 24 hr tablet, TAKE 1 TABLET BY MOUTH EVERY DAY, Disp: 90 tablet, Rfl: 1  Review of Systems:  Constitutional: Denies fever, chills, diaphoresis, appetite change and fatigue.  HEENT: Denies photophobia, eye pain, redness, hearing loss, ear pain, congestion, sore throat, rhinorrhea, sneezing, mouth sores, trouble swallowing, neck pain, neck stiffness and tinnitus.   Respiratory: Denies SOB, DOE, cough, chest tightness,  and wheezing.   Cardiovascular: Denies chest pain, palpitations and leg swelling.  Gastrointestinal: Denies nausea, vomiting, abdominal pain, diarrhea, constipation, blood in stool and abdominal distention.  Genitourinary: Denies dysuria, urgency, frequency, hematuria, flank pain and difficulty urinating.  Endocrine: Denies: hot or cold intolerance, sweats, changes in hair or nails, polyuria, polydipsia. Musculoskeletal: Denies myalgias, back pain, joint swelling, arthralgias and gait problem.  Skin: Denies pallor, rash and wound.  Neurological: Denies dizziness, seizures, syncope, weakness, light-headedness, numbness and headaches.  Hematological: Denies adenopathy. Easy bruising, personal or family bleeding history  Psychiatric/Behavioral: Denies suicidal ideation, mood changes, confusion, nervousness, sleep disturbance and agitation    Physical Exam: Vitals:   04/13/20 1036  BP: 140/70  Pulse: 72  Temp: 98.3 F (36.8 C)   TempSrc: Oral  SpO2: 98%  Weight: (!) 313 lb 1.6 oz (142 kg)    Body mass index is 44.93 kg/m.  Constitutional: NAD, calm, comfortable Eyes: PERRL, lids and conjunctivae normal ENMT: Mucous membranes are moist.  Respiratory: clear to auscultation bilaterally, no wheezing, no crackles. Normal respiratory effort. No accessory muscle use.  Cardiovascular: Regular rate and rhythm, no murmurs / rubs / gallops.  1-2+ bilateral lower extremity edema.  Neurologic: Grossly intact and nonfocal. Psychiatric: Normal judgment and insight. Alert and oriented x 3. Normal mood.    Impression and Plan:  Hospital discharge follow-up  Uncontrolled type 2 diabetes mellitus with stage 3 chronic kidney disease, with long-term current use of insulin ([place])  Sepsis due to gram-negative UTI ([place])  -She has completed course of Cipro. -She feels significantly improved. -I will order CBC and CMP that has been recommended by discharging physician to follow-up on white count and renal function. -Her A1c is elevated to 9.3 today, she tells me that her CBGs were quite  elevated in the hospital but are improving now that she is back home.  She has follow-up with endocrinology soon.  She is requesting GYN referral, I will place.  Time spent: 32 minutes reviewing medical records, interviewing and examining patient.    Lelon Frohlich, MD [facility]

## 2020-04-15 ENCOUNTER — Other Ambulatory Visit: Payer: Self-pay | Admitting: Internal Medicine

## 2020-04-15 ENCOUNTER — Telehealth: Payer: Self-pay | Admitting: Internal Medicine

## 2020-04-15 DIAGNOSIS — D638 Anemia in other chronic diseases classified elsewhere: Secondary | ICD-10-CM

## 2020-04-15 DIAGNOSIS — N289 Disorder of kidney and ureter, unspecified: Secondary | ICD-10-CM

## 2020-04-15 NOTE — Telephone Encounter (Signed)
Left message on machine for patient to return our call.  See lab result note. 

## 2020-04-15 NOTE — Telephone Encounter (Signed)
Patient is returning the call, please advise. CB is [phone]

## 2020-04-15 NOTE — Telephone Encounter (Signed)
Pt is calling back to make sure that the below msg was rec'd pt stated that she called back 3 times and twice today pt is aware that they are in clinic and someone will give her a call back.  Pt is aware that we ask for 24-48 hrs for msg to be replied to.

## 2020-05-03 ENCOUNTER — Other Ambulatory Visit: Payer: Self-pay

## 2020-05-03 ENCOUNTER — Encounter: Payer: Self-pay | Admitting: Internal Medicine

## 2020-05-03 ENCOUNTER — Ambulatory Visit: Payer: BLUE CROSS/BLUE SHIELD | Admitting: Internal Medicine

## 2020-05-03 VITALS — BP 146/72 | HR 76 | Ht 70.5 in | Wt 310.0 lb

## 2020-05-03 DIAGNOSIS — E1165 Type 2 diabetes mellitus with hyperglycemia: Secondary | ICD-10-CM

## 2020-05-03 DIAGNOSIS — IMO0002 Reserved for concepts with insufficient information to code with codable children: Secondary | ICD-10-CM

## 2020-05-03 DIAGNOSIS — Z794 Long term (current) use of insulin: Secondary | ICD-10-CM

## 2020-05-03 DIAGNOSIS — N183 Chronic kidney disease, stage 3 unspecified: Secondary | ICD-10-CM

## 2020-05-03 DIAGNOSIS — E1169 Type 2 diabetes mellitus with other specified complication: Secondary | ICD-10-CM | POA: Diagnosis not present

## 2020-05-03 DIAGNOSIS — E785 Hyperlipidemia, unspecified: Secondary | ICD-10-CM

## 2020-05-03 DIAGNOSIS — E1122 Type 2 diabetes mellitus with diabetic chronic kidney disease: Secondary | ICD-10-CM | POA: Diagnosis not present

## 2020-05-03 MED ORDER — OZEMPIC (0.25 OR 0.5 MG/DOSE) 2 MG/1.5ML ~~LOC~~ SOPN: 0.5000 mg | PEN_INJECTOR | SUBCUTANEOUS | 3 refills | Status: DC

## 2020-05-03 NOTE — Patient Instructions (Addendum)
Please continue: - Basaglar 40 units in a.m. and 20-30 units at night - Novolog dose: (25) - (15-20) - (15-20) units before meals - Novolog Sliding scale: - 150- 165: + 1 unit  - 166- 180: + 2 units  - 181- 195: + 3 units  - 196- 210: + 4 units  - >210: + 5 units  Take NovoLog 15 minutes before every meal.  Please start Ozempic 0.25 mg weekly in a.m. (for example on Sunday morning) x 4 weeks, then increase to 0.5 mg weekly in a.m. if no nausea or hypoglycemia.  Please return in 4 months with your sugar log.

## 2020-05-03 NOTE — Progress Notes (Signed)
Subjective:     Patient ID: Savannah Crawford, female   DOB: August 26, 1955, 65 y.o.   MRN: [id number]  This visit occurred during the SARS-CoV-2 public health emergency.  Safety protocols were in place, including screening questions prior to the visit, additional usage of staff PPE, and extensive cleaning of exam room while observing appropriate contact time as indicated for disinfecting solutions.   HPI [name] is a 65 y.o. woman returning for f/u for DM2, insulin-dependent, uncontrolled, with complications (CAD, CKD, diabetes neuropathy, diabetic retinopathy, PVD, amputated toe-R). Last visit 4 months ago.  Interim history: Patient was recently with pyelonephritis in 03/2020.  She recovered well afterwards.  Asymptomatic now. She continues to only check sugars in the morning.  The time and date on her glucometer are still off.  Reviewed HbA1c levels: Lab Results  Component Value Date   HGBA1C 9.3 (A) 04/13/2020   HGBA1C 9.3 (H) 12/04/2019   HGBA1C 8.8 (A) 09/03/2019   She is on: - Basaglar 40 units in the a.m. and 20-30 >> 20-30 units at night  - Novolog dose: 25-20-25 >> (25) - (15-20)  May miss - (15-20) units before meals  - Novolog Sliding scale: - 150- 165: + 1 unit  - 166- 180: + 2 units  - 181- 195: + 3 units  - 196- 210: + 4 units  - >210: + 5 units  I suggested Ozempic 0.5 mg weekly in the past, also: 11/2018 >> did not start as she did not want to add another med... In 03/2019, she did try to start it but this was expensive. She was on metformin in the past, but stopped 2/2 CKD.  Tried Victoza before >> cannot remember SE.  Checks sugars 0 to once a day-only in a.m.: - am: 151, 205 >> 98-146, 220 - forgot insulin >> 106-142, 155, 180 - after b'fast: 128-183 >> n/c - prelunch:185 >> 170-180 >> n/c - after lunch: 236 >> n/c - predinner: 230-240 >> 144, 153 >> n/c - after dinner: 77 - bedtime: n/c >> 135-217, 366 >> n/c  Lowest: 68 >> 98 >> 144 >> 77 >> 106 Highest sugar  250 >> 240 >> 205 >> 220 >> 180  Meter: Freestyle Lite  + CKD-sees nephrology: Lab Results  Component Value Date   BUN 30 (H) 04/13/2020   CREATININE 1.91 (H) 04/13/2020  On lisinopril 10.  + HL; Latest lipids: Lab Results  Component Value Date   CHOL 136 12/04/2019   HDL 55 12/04/2019   LDLCALC 65 12/04/2019   TRIG 79 12/04/2019   CHOLHDL 2.5 12/04/2019  On Lipitor 20.  Last eye exam 03/17/2019: + DR, + cataract reportedly. Appt next mo.  + Stable numbness and tingling in her feet.  She sees podiatry (Dr. Melony Crawford).  She has a history of left third toe ulcer.  She has a history of nonobstructive CAD, with positive stress test but a negative cardiac cath 2012, and also has diastolic heart failure. She has HTN, venous stasis, vitamin D deficiency.  She continues to see podiatry.  She has a history of skin graft fourth toe ulcer  She denies family history of medullary thyroid cancer and no personal history of pancreatitis.  Review of Systems Constitutional: no weight gain/no weight loss, no fatigue, no subjective hyperthermia, no subjective hypothermia Eyes: no blurry vision, no xerophthalmia ENT: no sore throat, no nodules palpated in neck, no dysphagia, no odynophagia, no hoarseness Cardiovascular: no CP/no SOB/no palpitations/no leg swelling Respiratory: no cough/no SOB/no  wheezing Gastrointestinal: no N/no V/no D/no C/no acid reflux Musculoskeletal: no muscle aches/no joint aches Skin: no rashes, no hair loss Neurological: no tremors/+ numbness/+ tingling/no dizziness  I reviewed pt's medications, allergies, PMH, social hx, family hx, and changes were documented in the history of present illness. Otherwise, unchanged from my initial visit note.  Past Medical History:  Diagnosis Date  . Anemia of chronic disease 11/17/2012   not on meds at this time  . At risk for heart disease - +stres test 2012 followed by [facility], [place] with neg cardiac cath 03/2010  (non-obstructive CAD) per review of records, echo 02/2010 normal LVF 01/15/2012  . Chronic kidney disease (CKD), stage IV (severe) ([place]) 01/09/2012  . Diabetes mellitus with renal manifestations, uncontrolled ([place])    on meds  . Diabetic neuropathy ([place]) 01/09/2012  . Diastolic heart failure - stage 1 per review of echo results from 02/2010 01/15/2012  . GERD (gastroesophageal reflux disease)    OTC meds for tx  . Hyperlipidemia    on meds  . Hypertension    on meds  . Obesity   . Peripheral arterial disease? Eval by vascular 2013 with no LE PAD and advised to stop pletal   . Severe obesity (BMI >= 40) ([place]) 11/13/2012  . Toe ulcer, right ([place])   . Ulcer of toe of left foot ([place])    treated by [facility] per patient  . Venous stasis of lower extremity 03/10/2012   Past Surgical History:  Procedure Laterality Date  . COLONOSCOPY  2013/2014   [place]- records not available   . REFRACTIVE SURGERY Bilateral   . TOE AMPUTATION Right 2008   RIGHT    Social History   Socioeconomic History  . Marital status: Married    Spouse name: Not on file  . Number of children: Not on file  . Years of education: Not on file  . Highest education level: Not on file  Occupational History  . Not on file  Tobacco Use  . Smoking status: Former Smoker    Types: Cigarettes    Quit date: 02/20/1999    Years since quitting: 21.2  . Smokeless tobacco: Never Used  Vaping Use  . Vaping Use: Never used  Substance and Sexual Activity  . Alcohol use: Yes    Alcohol/week: 4.0 standard drinks    Types: 4 Standard drinks or equivalent per week  . Drug use: Not Currently  . Sexual activity: Not on file  Other Topics Concern  . Not on file  Social History Narrative  . Not on file   Social Determinants of Health   Financial Resource Strain: Not on file  Food Insecurity: Not on file  Transportation Needs: Not on file  Physical Activity: Not on file  Stress: Not on file  Social Connections: Not on file   Intimate Partner Violence: Not on file   Current Outpatient Medications on File Prior to Visit  Medication Sig Dispense Refill  . atorvastatin (LIPITOR) 20 MG tablet TAKE 1 TABLET BY MOUTH EVERY DAY 90 tablet 1  . Blood Glucose Monitoring Suppl (FREESTYLE LITE) DEVI Use to check sugar 2 times daily 1 each 0  . furosemide (LASIX) 40 MG tablet TAKE 1 TABLET BY MOUTH TWICE A DAY 180 tablet 1  . glucose blood (FREESTYLE LITE) test strip Use as instructed to check the sugar 3 times daily 200 each 5  . insulin aspart (NOVOLOG FLEXPEN) 100 UNIT/ML FlexPen Inject up to 90 units daily  as instructed. 60 mL 1  . Insulin Glargine (BASAGLAR KWIKPEN) 100 UNIT/ML INJECT UP TO 80 UNITS A DAY AT BEDTIME 72 mL 1  . Insulin Pen Needle (B-D UF III MINI PEN NEEDLES) 31G X 5 MM MISC USE AS DIRECTED - 5X A DAY. 100 each 17  . lisinopril (ZESTRIL) 10 MG tablet TAKE 1 TABLET BY MOUTH EVERY DAY 90 tablet 1  . metoprolol succinate (TOPROL-XL) 25 MG 24 hr tablet TAKE 1 TABLET BY MOUTH EVERY DAY 90 tablet 1   No current facility-administered medications on file prior to visit.   No Known Allergies Family History  Problem Relation Age of Onset  . Diabetes Mother   . Hyperlipidemia Mother   . Hypertension Mother   . Heart attack Mother   . Stroke Mother   . Diabetes Sister   . Breast cancer Sister 65  . Hypertension Brother   . Diabetes Sister   . Diabetes Sister   . Obesity Other   . Hypertension Other   . Hyperlipidemia Other   . Stroke Other   . Heart attack Other   . Colon polyps Neg Hx   . Colon cancer Neg Hx   . Esophageal cancer Neg Hx   . Rectal cancer Neg Hx   . Stomach cancer Neg Hx    Objective:   Physical Exam BP (!) 146/72   Pulse 76   Ht 5' 10.5" (1.791 m)   Wt (!) 310 lb (140.6 kg)   SpO2 99%   BMI 43.85 kg/m  Body mass index is 43.85 kg/m. Wt Readings from Last 3 Encounters:  05/03/20 (!) 310 lb (140.6 kg)  04/13/20 (!) 313 lb 1.6 oz (142 kg)  02/11/20 (!) 312 lb (141.5 kg)    Constitutional: overweight, in NAD Eyes: PERRLA, EOMI, no exophthalmos ENT: moist mucous membranes, no thyromegaly, no cervical lymphadenopathy Cardiovascular: RRR, No MRG, + B LE edema L>R Respiratory: CTA B Gastrointestinal: abdomen soft, NT, ND, BS+ Musculoskeletal: no deformities, strength intact in all 4 Skin: moist, warm, no rashes Neurological: no tremor with outstretched hands, DTR normal in all 4  Assessment:     1. DM2, insulin-dependent, uncontrolled, with complications - nonobstructive CAD - CKD - diabetes neuropathy - diabetic retinopathy - PVD - R amputated toe  2. HL  3. Obesity class 3    Plan:     1. Pt with history of uncontrolled diabetes type 2, on basal insulin regimen only, as a GLP-1 receptor agonist was not covered for her. Hers diabetes control is hindered by not checking blood sugars as recommended and missing medication doses.  In the past, when she took her insulin doses as prescribed, her sugars improved significantly.  Therefore, at last visits, I did suggest alarms on her phone.  Last visit, HbA1c was higher than before, at 9.3%.  Since then, she had another HbA1c checked last month and this was stable at 9.3%. -At last visit, reviewing her meter, the date and time more often I advised her to try to change these.  However, whenever she checks her blood sugars, or the sugars were at goal with only one high CBG at 220 when she forgets to take the insulin the night before.  Since there was a significant improvement in her blood sugars, we did not change the regimen.  We did discuss about possibly adding back Ozempic if covered by her insurance. -At this visit, she only checks sugars in the morning and they are mostly at or slightly  above goal, but occasionally higher, up to 180.  The sugars are higher during her pyelonephritis episode, but they improved recently.  However, it is difficult to discern patterns, since she is only checking her sugars once in  the morning.  I again strongly advised her to check later in the day and also to fix the date and time on her meter.  However, based on the recent HbA1c, her sugars do increase throughout the day, so at this visit I again suggested to add Ozempic.  We discussed in detail about benefits and possible side effects.  She reluctantly agrees to try it.  We will start at a low dose and increase as tolerated.  For now we will continue the same insulin doses, but I did advise her that if her sugars start decreasing, we will need to back off. -  I advised her to: Patient Instructions  Please continue: - Basaglar 40 units in a.m. and 20-30 units at night - Novolog dose: (25) - (15-20) - (15-20) units before meals - Novolog Sliding scale: - 150- 165: + 1 unit  - 166- 180: + 2 units  - 181- 195: + 3 units  - 196- 210: + 4 units  - >210: + 5 units  Take NovoLog 15 minutes before every meal.  Please start Ozempic 0.25 mg weekly in a.m. (for example on Sunday morning) x 4 weeks, then increase to 0.5 mg weekly in a.m. if no nausea or hypoglycemia.  Please return in 4 months with your sugar log.   - advised to check sugars at different times of the day - 3-4x a day, rotating check times - advised for yearly eye exams >> she is not UTD, but has an appointment coming up - return to clinic in 4 months  2. HL -Reviewed latest lipid panel from 11/2019: All fractions at goal: Lab Results  Component Value Date   CHOL 136 12/04/2019   HDL 55 12/04/2019   LDLCALC 65 12/04/2019   TRIG 79 12/04/2019   CHOLHDL 2.5 12/04/2019  -Continues Lipitor 10 mg daily without side effects  3.  Obesity class III -Unfortunately, she could not afford a GLP-1 receptor agonist.  I am hoping that she will be able to start Ozempic now, in the new year -this should also help with weight loss. -Weight was stable before last visit, lost 2 lbs since then  Philemon Kingdom, MD PhD [facility] Endocrinology

## 2020-05-27 ENCOUNTER — Other Ambulatory Visit (INDEPENDENT_AMBULATORY_CARE_PROVIDER_SITE_OTHER): Payer: BLUE CROSS/BLUE SHIELD

## 2020-05-27 ENCOUNTER — Other Ambulatory Visit: Payer: Self-pay

## 2020-05-27 DIAGNOSIS — D638 Anemia in other chronic diseases classified elsewhere: Secondary | ICD-10-CM | POA: Diagnosis not present

## 2020-05-27 LAB — CBC WITH DIFFERENTIAL/PLATELET
Basophils Absolute: 0 10*3/uL (ref 0.0–0.1)
Basophils Relative: 0.6 % (ref 0.0–3.0)
Eosinophils Absolute: 0.2 10*3/uL (ref 0.0–0.7)
Eosinophils Relative: 3 % (ref 0.0–5.0)
HCT: 34.9 % — ABNORMAL LOW (ref 36.0–46.0)
Hemoglobin: 11.4 g/dL — ABNORMAL LOW (ref 12.0–15.0)
Lymphocytes Relative: 26.4 % (ref 12.0–46.0)
Lymphs Abs: 2.2 10*3/uL (ref 0.7–4.0)
MCHC: 32.7 g/dL (ref 30.0–36.0)
MCV: 83.4 fl (ref 78.0–100.0)
Monocytes Absolute: 0.8 10*3/uL (ref 0.1–1.0)
Monocytes Relative: 10.1 % (ref 3.0–12.0)
Neutro Abs: 4.9 10*3/uL (ref 1.4–7.7)
Neutrophils Relative %: 59.9 % (ref 43.0–77.0)
Platelets: 288 10*3/uL (ref 150.0–400.0)
RBC: 4.18 Mil/uL (ref 3.87–5.11)
RDW: 14.7 % (ref 11.5–15.5)
WBC: 8.2 10*3/uL (ref 4.0–10.5)

## 2020-05-30 ENCOUNTER — Ambulatory Visit (INDEPENDENT_AMBULATORY_CARE_PROVIDER_SITE_OTHER): Payer: BLUE CROSS/BLUE SHIELD | Admitting: Obstetrics & Gynecology

## 2020-05-30 ENCOUNTER — Other Ambulatory Visit (HOSPITAL_COMMUNITY)
Admission: RE | Admit: 2020-05-30 | Discharge: 2020-05-30 | Disposition: A | Discharge status: Home / Self Care | Payer: BLUE CROSS/BLUE SHIELD | Source: Ambulatory Visit | Attending: Obstetrics & Gynecology | Admitting: Obstetrics & Gynecology

## 2020-05-30 ENCOUNTER — Encounter: Payer: Self-pay | Admitting: Obstetrics & Gynecology

## 2020-05-30 ENCOUNTER — Other Ambulatory Visit: Payer: Self-pay

## 2020-05-30 VITALS — BP 147/79 | HR 93 | Ht 70.0 in | Wt 310.0 lb

## 2020-05-30 DIAGNOSIS — Z01419 Encounter for gynecological examination (general) (routine) without abnormal findings: Secondary | ICD-10-CM | POA: Insufficient documentation

## 2020-05-30 DIAGNOSIS — Z1231 Encounter for screening mammogram for malignant neoplasm of breast: Secondary | ICD-10-CM | POA: Diagnosis not present

## 2020-05-30 NOTE — Patient Instructions (Signed)
Please schedule a mammogram at one of the following locations:  [facility] [id number] [place] in [place]

## 2020-05-30 NOTE — Progress Notes (Signed)
   WELL-WOMAN EXAMINATION Patient name: Savannah Crawford MRN [id number]  Date of birth: April 20, 1955 Chief Complaint:   Gynecologic Exam  History of Present Illness:   Savannah Crawford is a 65 y.o. G1P1001 postmenopausal African American female being seen today for a routine well-woman exam.  Today she notes: no acute complaints or concerns   No LMP recorded. Patient is postmenopausal.  Last pap 2017.  Last mammogram: not sure. Last colonoscopy: up to date  Depression screen [facility] 2/9 05/30/2020 12/04/2019 01/02/2019 11/18/2017  Decreased Interest 0 0 0 0  Down, Depressed, Hopeless 0 0 0 1  PHQ - 2 Score 0 0 0 1  Altered sleeping 0 - 0 0  Tired, decreased energy 0 - 0 0  Change in appetite 0 - 0 0  Feeling bad or failure about yourself  0 - 0 0  Trouble concentrating 0 - 0 0  Moving slowly or fidgety/restless 0 - 0 0  Suicidal thoughts 0 - 0 0  PHQ-9 Score 0 - 0 1  Difficult doing work/chores - - Not difficult at all -      Review of Systems:   Pertinent items are noted in HPI Denies any headaches, blurred vision, fatigue, shortness of breath, chest pain, abdominal pain, bowel movements, urination, or intercourse unless otherwise stated above.  Pertinent History Reviewed:  Reviewed past medical,surgical, social and family history.  Reviewed problem list, medications and allergies. Physical Assessment:   Vitals:   05/30/20 1123  BP: (!) 147/79  Pulse: 93  Weight: (!) 310 lb (140.6 kg)  Height: '5\' 10"'$  (1.778 m)  Body mass index is 44.48 kg/m.        Physical Examination:   General appearance - well appearing, and in no distress  Mental status - alert, oriented to person, place, and time  Psych:  She has a normal mood and affect  Skin - warm and dry, normal color, no suspicious lesions noted  Chest - effort normal, all lung fields clear to auscultation bilaterally  Heart - normal rate and regular rhythm  Neck:  midline trachea, no thyromegaly or nodules  Breasts - breasts  appear normal, no suspicious masses, no skin or nipple changes or  axillary nodes  Abdomen - obese, soft, nontender, nondistended, no masses appreciated though exam limited due to body habitus  Pelvic - VULVA: normal appearing vulva with no masses, tenderness or lesions  VAGINA: normal appearing vagina with normal color and discharge, no lesions  CERVIX: normal appearing cervix without discharge or lesions, no CMT  Thin prep pap is done with HR HPV cotesting  UTERUS: uterus is felt to be normal size, shape, consistency and nontender   ADNEXA: No adnexal masses or tenderness noted- exam limited due to body habitus  Extremities:  No swelling or varicosities noted  Chaperone: Celene Squibb     Assessment & Plan:  1) Well-Woman Exam -pap collected, reviewed screening guidelines- if negative no further pap indicated -mammogram screening reviewed and ordered   Orders Placed This Encounter  Procedures  . MM 3D SCREEN BREAST BILATERAL    Meds: No orders of the defined types were placed in this encounter.   Follow-up: Return in about 1 year (around 05/30/2021) for Annual, please print AVS.   Janyth Pupa, DO Attending [place], [place] for [facility], [place]

## 2020-06-06 LAB — CYTOLOGY - PAP
Comment: NEGATIVE
Comment: NEGATIVE
Diagnosis: REACTIVE
HPV 16: NEGATIVE
HPV 18 / 45: NEGATIVE
High risk HPV: POSITIVE — AB

## 2020-06-08 ENCOUNTER — Telehealth: Payer: Self-pay | Admitting: *Deleted

## 2020-06-08 NOTE — Telephone Encounter (Signed)
Pt aware of +HPV on pap. Pt aware of Dr. Annie Main recommendations and was told a letter would be mailed next year to schedule next pap. Pt voiced understanding. Savannah Crawford

## 2020-06-08 NOTE — Telephone Encounter (Signed)
-----   Message from Janyth Pupa, DO sent at 06/08/2020  7:17 AM EDT ----- Regarding: pap results Pap negative, HPV positive.  Typically 1 in 3 women are HPV positive.  The HPV we worry about is HPV 16/18- since that was negative.  No further testing at this time is required.  The plan will be to repeat your pap smear next year so please be sure to schedule your annual appointment.  Please inform patient of above- she does not have MyChart.  Thanks!   ----- Message ----- From: Interface, [facility] [place] Sent: 06/06/2020   6:17 AM EDT To: Janyth Pupa, DO

## 2020-06-18 ENCOUNTER — Other Ambulatory Visit: Payer: Self-pay | Admitting: Internal Medicine

## 2020-06-18 DIAGNOSIS — E1169 Type 2 diabetes mellitus with other specified complication: Secondary | ICD-10-CM

## 2020-07-07 ENCOUNTER — Ambulatory Visit: Payer: BLUE CROSS/BLUE SHIELD

## 2020-07-07 ENCOUNTER — Other Ambulatory Visit: Payer: Self-pay

## 2020-07-07 ENCOUNTER — Ambulatory Visit
Admission: RE | Admit: 2020-07-07 | Discharge: 2020-07-07 | Disposition: A | Discharge status: Home / Self Care | Payer: BLUE CROSS/BLUE SHIELD | Source: Ambulatory Visit | Attending: Obstetrics & Gynecology | Admitting: Obstetrics & Gynecology

## 2020-07-07 DIAGNOSIS — Z1231 Encounter for screening mammogram for malignant neoplasm of breast: Secondary | ICD-10-CM

## 2020-07-12 ENCOUNTER — Telehealth: Payer: Self-pay | Admitting: Obstetrics & Gynecology

## 2020-07-12 ENCOUNTER — Other Ambulatory Visit: Payer: Self-pay | Admitting: Obstetrics & Gynecology

## 2020-07-12 ENCOUNTER — Other Ambulatory Visit: Payer: Self-pay | Admitting: Nurse Practitioner

## 2020-07-12 DIAGNOSIS — R928 Other abnormal and inconclusive findings on diagnostic imaging of breast: Secondary | ICD-10-CM

## 2020-07-12 NOTE — Telephone Encounter (Signed)
Patient called stating that she would like a call back from either the nurse of the provider regarding her test results of her breast exam. Please contact pt

## 2020-07-12 NOTE — Telephone Encounter (Signed)
Called pt to ensure that she had a follow-up US scheduled as recommended. Appt set for 6/15.

## 2020-07-26 ENCOUNTER — Ambulatory Visit: Payer: BLUE CROSS/BLUE SHIELD | Admitting: Internal Medicine

## 2020-07-26 ENCOUNTER — Other Ambulatory Visit: Payer: Self-pay

## 2020-07-26 ENCOUNTER — Encounter: Payer: Self-pay | Admitting: Internal Medicine

## 2020-07-26 VITALS — BP 128/72 | HR 74 | Ht 70.0 in | Wt 309.8 lb

## 2020-07-26 DIAGNOSIS — E1122 Type 2 diabetes mellitus with diabetic chronic kidney disease: Secondary | ICD-10-CM | POA: Diagnosis not present

## 2020-07-26 DIAGNOSIS — Z794 Long term (current) use of insulin: Secondary | ICD-10-CM

## 2020-07-26 DIAGNOSIS — N183 Chronic kidney disease, stage 3 unspecified: Secondary | ICD-10-CM

## 2020-07-26 DIAGNOSIS — E1169 Type 2 diabetes mellitus with other specified complication: Secondary | ICD-10-CM | POA: Diagnosis not present

## 2020-07-26 DIAGNOSIS — E1165 Type 2 diabetes mellitus with hyperglycemia: Secondary | ICD-10-CM | POA: Diagnosis not present

## 2020-07-26 DIAGNOSIS — IMO0002 Reserved for concepts with insufficient information to code with codable children: Secondary | ICD-10-CM

## 2020-07-26 DIAGNOSIS — E785 Hyperlipidemia, unspecified: Secondary | ICD-10-CM

## 2020-07-26 LAB — POCT GLYCOSYLATED HEMOGLOBIN (HGB A1C): Hemoglobin A1C: 7.8 % — AB (ref 4.0–5.6)

## 2020-07-26 MED ORDER — FREESTYLE LITE TEST VI STRP: ORAL_STRIP | 5 refills | Status: DC

## 2020-07-26 NOTE — Progress Notes (Signed)
Subjective:     Patient ID: Savannah Crawford, female   DOB: 1955-03-25, 65 y.o.   MRN: [id number]  This visit occurred during the SARS-CoV-2 public health emergency.  Safety protocols were in place, including screening questions prior to the visit, additional usage of staff PPE, and extensive cleaning of exam room while observing appropriate contact time as indicated for disinfecting solutions.   HPI [name] is a 65 y.o. woman returning for f/u for DM2, insulin-dependent, uncontrolled, with complications (CAD, CKD, diabetes neuropathy, diabetic retinopathy, PVD, amputated toe-R). Last visit 3 months ago.  Interim history: No increased urination, blurry vision, chest pain. She had nausea with Ozempic >> improving. She is occasionally dizzy with standing, especially after increasing her Lasix to twice a day.  She is trying to stay well-hydrated.  Reviewed HbA1c levels: Lab Results  Component Value Date   HGBA1C 9.3 (A) 04/13/2020   HGBA1C 9.3 (H) 12/04/2019   HGBA1C 8.8 (A) 09/03/2019   She is on: - Basaglar 40 units in the a.m. and 20-30 >> 20-30 units at night  - Novolog dose: 25-20-25 >> (25) - (15-20)  May miss - (15-20) units before meals  - Novolog Sliding scale: - 150- 165: + 1 unit  - 166- 180: + 2 units  - 181- 195: + 3 units  - 196- 210: + 4 units  - >210: + 5 units  - Ozempic 0.5 mg weekly- started 03/2020 -she initially had nausea as she started at the higher dose as she misunderstood instructions, but much better now I again suggested Ozempic 0.5 mg weekly in the past, also: 11/2018 >> did not start as she did not want to add another med... In 03/2019, she did try to start it but this was expensive. She was on metformin in the past, but stopped 2/2 CKD.  Tried Victoza before >> cannot remember SE.  Checks sugars 0 to once a day-only in a.m.: - am: 151, 205 >> 98-146, 220 - forgot insulin >> 106-142, 155, 180 >> 85-177 (higher if forgot insulin at night) - after b'fast:  128-183 >> n/c - prelunch:185 >> 170-180 >> n/c - after lunch: 236 >> n/c - predinner: 230-240 >> 144, 153 >> n/c - after dinner: 77 >> 119-195 - bedtime: n/c >> 135-217, 366 >> n/c  Lowest: 68 >> 98 >> 144 >> 77 >> 106 >> 85 Highest sugar 250 >> 240 >> 205 >> 220 >> 180 >> 195  Meter: Freestyle Lite  + CKD-sees nephrology: Lab Results  Component Value Date   BUN 30 (H) 04/13/2020   CREATININE 1.91 (H) 04/13/2020  On lisinopril 10.  + HL; Latest lipids: Lab Results  Component Value Date   CHOL 136 12/04/2019   HDL 55 12/04/2019   LDLCALC 65 12/04/2019   TRIG 79 12/04/2019   CHOLHDL 2.5 12/04/2019  On Lipitor 20.  Last eye exam 06/2020: + DR, + cataract reportedly.   + Stable numbness and tingling in her feet.  She sees podiatry (Dr. Melony Overly).  She has a history of left third toe ulcer.  She has a history of nonobstructive CAD, with positive stress test but a negative cardiac cath 2012, and also has diastolic heart failure. She has HTN, venous stasis, vitamin D deficiency.  She continues to see podiatry.  She has a history of skin graft fourth toe ulcer  She denies family history of medullary thyroid cancer and no personal history of pancreatitis.  Review of Systems Constitutional: no weight gain/no weight loss,  no fatigue, no subjective hyperthermia, no subjective hypothermia Eyes: no blurry vision, no xerophthalmia ENT: no sore throat, no nodules palpated in neck, no dysphagia, no odynophagia, no hoarseness Cardiovascular: no CP/no SOB/no palpitations/no leg swelling Respiratory: no cough/no SOB/no wheezing Gastrointestinal: + N/no V/no D/no C/no acid reflux Musculoskeletal: no muscle aches/no joint aches Skin: no rashes, no hair loss Neurological: no tremors/+ numbness/+ tingling/+ dizziness  I reviewed pt's medications, allergies, PMH, social hx, family hx, and changes were documented in the history of present illness. Otherwise, unchanged from my initial visit  note.  Past Medical History:  Diagnosis Date  . Anemia of chronic disease 11/17/2012   not on meds at this time  . At risk for heart disease - +stres test 2012 followed by [facility], [place] with neg cardiac cath 03/2010 (non-obstructive CAD) per review of records, echo 02/2010 normal LVF 01/15/2012  . Chronic kidney disease (CKD), stage IV (severe) ([place]) 01/09/2012  . Diabetes mellitus with renal manifestations, uncontrolled ([place])    on meds  . Diabetic neuropathy ([place]) 01/09/2012  . Diastolic heart failure - stage 1 per review of echo results from 02/2010 01/15/2012  . GERD (gastroesophageal reflux disease)    OTC meds for tx  . Hyperlipidemia    on meds  . Hypertension    on meds  . Obesity   . Peripheral arterial disease? Eval by vascular 2013 with no LE PAD and advised to stop pletal   . Severe obesity (BMI >= 40) ([place]) 11/13/2012  . Toe ulcer, right ([place])   . Ulcer of toe of left foot ([place])    treated by [facility] per patient  . Venous stasis of lower extremity 03/10/2012   Past Surgical History:  Procedure Laterality Date  . COLONOSCOPY  2013/2014   [place]- records not available   . REFRACTIVE SURGERY Bilateral   . TOE AMPUTATION Right 2008   RIGHT    Social History   Socioeconomic History  . Marital status: Married    Spouse name: Not on file  . Number of children: Not on file  . Years of education: Not on file  . Highest education level: Not on file  Occupational History  . Not on file  Tobacco Use  . Smoking status: Former Smoker    Types: Cigarettes    Quit date: 02/20/1999    Years since quitting: 21.4  . Smokeless tobacco: Never Used  Vaping Use  . Vaping Use: Never used  Substance and Sexual Activity  . Alcohol use: Yes    Alcohol/week: 4.0 standard drinks    Types: 4 Standard drinks or equivalent per week  . Drug use: Not Currently  . Sexual activity: Not on file  Other Topics Concern  . Not on file  Social History Narrative  . Not on file    Social Determinants of Health   Financial Resource Strain: Low Risk   . Difficulty of Paying Living Expenses: Not hard at all  Food Insecurity: No Food Insecurity  . Worried About Charity fundraiser in the Last Year: Never true  . Ran Out of Food in the Last Year: Never true  Transportation Needs: No Transportation Needs  . Lack of Transportation (Medical): No  . Lack of Transportation (Non-Medical): No  Physical Activity: Insufficiently Active  . Days of Exercise per Week: 2 days  . Minutes of Exercise per Session: 60 min  Stress: No Stress Concern Present  . Feeling of Stress : Not at all  Social Connections: Unknown  . Frequency of Communication with Friends and Family: Patient refused  . Frequency of Social Gatherings with Friends and Family: Patient refused  . Attends Religious Services: More than 4 times per year  . Active Member of Clubs or Organizations: Yes  . Attends Archivist Meetings: More than 4 times per year  . Marital Status: Married  Human resources officer Violence: Not At Risk  . Fear of Current or Ex-Partner: No  . Emotionally Abused: No  . Physically Abused: No  . Sexually Abused: No   Current Outpatient Medications on File Prior to Visit  Medication Sig Dispense Refill  . atorvastatin (LIPITOR) 20 MG tablet TAKE 1 TABLET BY MOUTH EVERY DAY 90 tablet 1  . Blood Glucose Monitoring Suppl (FREESTYLE LITE) DEVI Use to check sugar 2 times daily 1 each 0  . furosemide (LASIX) 40 MG tablet TAKE 1 TABLET BY MOUTH TWICE A DAY 180 tablet 1  . glucose blood (FREESTYLE LITE) test strip Use as instructed to check the sugar 3 times daily 200 each 5  . insulin aspart (NOVOLOG FLEXPEN) 100 UNIT/ML FlexPen Inject up to 90 units daily as instructed. 60 mL 1  . Insulin Glargine (BASAGLAR KWIKPEN) 100 UNIT/ML INJECT UP TO 80 UNITS A DAY AT BEDTIME 72 mL 1  . Insulin Pen Needle (B-D UF III MINI PEN NEEDLES) 31G X 5 MM MISC USE AS DIRECTED - 5X A DAY. 100 each 17  .  lisinopril (ZESTRIL) 10 MG tablet TAKE 1 TABLET BY MOUTH EVERY DAY 90 tablet 1  . metoprolol succinate (TOPROL-XL) 25 MG 24 hr tablet TAKE 1 TABLET BY MOUTH EVERY DAY 90 tablet 1  . Semaglutide,0.25 or 0.'5MG'$ /DOS, (OZEMPIC, 0.25 OR 0.5 MG/DOSE,) 2 MG/1.5ML SOPN Inject 0.5 mg into the skin once a week. 4.5 mL 3   No current facility-administered medications on file prior to visit.   No Known Allergies Family History  Problem Relation Age of Onset  . Diabetes Mother   . Hyperlipidemia Mother   . Hypertension Mother   . Heart attack Mother   . Stroke Mother   . Diabetes Sister   . Breast cancer Sister 29  . Hypertension Brother   . Diabetes Sister   . Diabetes Sister   . Obesity Other   . Hypertension Other   . Hyperlipidemia Other   . Stroke Other   . Heart attack Other   . Colon polyps Neg Hx   . Colon cancer Neg Hx   . Esophageal cancer Neg Hx   . Rectal cancer Neg Hx   . Stomach cancer Neg Hx    Objective:   Physical Exam BP 128/72 (BP Location: Right Arm, Patient Position: Sitting, Cuff Size: Normal)   Pulse 74   Ht '5\' 10"'$  (1.778 m)   Wt (!) 309 lb 12.8 oz (140.5 kg)   SpO2 96%   BMI 44.45 kg/m  Body mass index is 44.45 kg/m. Wt Readings from Last 3 Encounters:  07/26/20 (!) 309 lb 12.8 oz (140.5 kg)  05/30/20 (!) 310 lb (140.6 kg)  05/03/20 (!) 310 lb (140.6 kg)   Constitutional: overweight, in NAD Eyes: PERRLA, EOMI, no exophthalmos ENT: moist mucous membranes, no thyromegaly, no cervical lymphadenopathy Cardiovascular: RRR, No MRG, + B LE edema L>R Respiratory: CTA B Gastrointestinal: abdomen soft, NT, ND, BS+ Musculoskeletal: no deformities, strength intact in all 4 Skin: moist, warm, no rashes Neurological: no tremor with outstretched hands, DTR normal in all 4  Assessment:  1. DM2, insulin-dependent, uncontrolled, with complications - nonobstructive CAD - CKD - diabetes neuropathy - diabetic retinopathy - PVD - R amputated toe  2. HL  3.  Obesity class 3    Plan:     1. Pt with history of uncontrolled diabetes type 2 on basal-bolus insulin regimen with GLP-1 receptor agonist recommended at last visit. -At last visit, she was only checking sugars in the morning and they were mostly after slightly above goal, but occasionally higher, up to 180.  Sugars were higher after her [place] nephritis episode, but they improved before last visit.  I advised her to start checking sugars later in the day to better discern patterns, but I again suggested to add Ozempic.  Discussed about benefits and possible side effects.  She reluctantly agreed to try it.  Advised her to start at a low dose and increase as tolerated.  HbA1c at last visit was stable, high, at 9.3% -At last visit, I also suggested to change the date and time on her meter, since these were off -At today's visit, sugars are better, with still occasional values above target, but many of them within range.  She was able to start Ozempic and she is now tolerating it well, however, she had increased nausea in the beginning after starting directly at 0.5 mg weekly dose, as she misunderstood instructions to start at a lower dose. -Upon questioning, she still taking NovoLog after meals, despite repeated verbal and written instructions that she needs to take it 15 minutes before every meal.  I again strongly advised her to do so.  Since the sugars improved, I advised her to decrease the dose of Basaglar at night and also the NovoLog dose with every meal.  She is not checking sugars before lunch and dinner and I advised her to do so to be able to adjust her NovoLog dose based on the sliding scale. -  I advised her to: Patient Instructions  Please change: - Basaglar 40 units in a.m. and 20 units at night - Novolog dose: (15-20) - (10-15) - (10-15) units before meals - Novolog Sliding scale: - 150- 165: + 1 unit  - 166- 180: + 2 units  - 181- 195: + 3 units  - 196- 210: + 4 units  - >210: + 5  units  Take NovoLog 15 minutes before every meal. - Ozempic 0.5 mg weekly  Please return in 3 months with your sugar log.   - we checked her HbA1c: 7.8% (much better) - advised to check sugars at different times of the day - 4x a day, rotating check times - advised for yearly eye exams >> she is UTD - return to clinic in 3 months  2. HL -Reviewed latest lipid panel from 11/2019: All fractions at goal: Lab Results  Component Value Date   CHOL 136 12/04/2019   HDL 55 12/04/2019   LDLCALC 65 12/04/2019   TRIG 79 12/04/2019   CHOLHDL 2.5 12/04/2019  -Continues Lipitor 20 mg daily without side effects  3.  Obesity class III -She could not afford a GLP-1 receptor agonist in the past but at last visit we started Ozempic.  This will also help with weight loss. -No significant weight loss before last visit -She lost 1 pound since then.  Philemon Kingdom, MD PhD [facility] Endocrinology

## 2020-07-26 NOTE — Patient Instructions (Addendum)
Please change: - Basaglar 40 units in a.m. and 20 units at night - Novolog dose: (15-20) - (10-15) - (10-15) units before meals - Novolog Sliding scale: - 150- 165: + 1 unit  - 166- 180: + 2 units  - 181- 195: + 3 units  - 196- 210: + 4 units  - >210: + 5 units  Take NovoLog 15 minutes before every meal. - Ozempic 0.5 mg weekly  Please return in 3 months with your sugar log.

## 2020-07-28 ENCOUNTER — Other Ambulatory Visit: Payer: Self-pay | Admitting: Internal Medicine

## 2020-07-28 DIAGNOSIS — E1159 Type 2 diabetes mellitus with other circulatory complications: Secondary | ICD-10-CM

## 2020-07-30 ENCOUNTER — Other Ambulatory Visit: Payer: Self-pay | Admitting: Internal Medicine

## 2020-07-30 DIAGNOSIS — IMO0002 Reserved for concepts with insufficient information to code with codable children: Secondary | ICD-10-CM

## 2020-07-30 DIAGNOSIS — I152 Hypertension secondary to endocrine disorders: Secondary | ICD-10-CM

## 2020-08-03 ENCOUNTER — Other Ambulatory Visit: Payer: Self-pay | Admitting: Nurse Practitioner

## 2020-08-03 ENCOUNTER — Ambulatory Visit
Admission: RE | Admit: 2020-08-03 | Discharge: 2020-08-03 | Disposition: A | Discharge status: Home / Self Care | Payer: BLUE CROSS/BLUE SHIELD | Source: Ambulatory Visit | Attending: Nurse Practitioner | Admitting: Nurse Practitioner

## 2020-08-03 ENCOUNTER — Other Ambulatory Visit: Payer: Self-pay

## 2020-08-03 DIAGNOSIS — R928 Other abnormal and inconclusive findings on diagnostic imaging of breast: Secondary | ICD-10-CM

## 2020-08-04 ENCOUNTER — Ambulatory Visit: Payer: BLUE CROSS/BLUE SHIELD

## 2020-09-14 ENCOUNTER — Telehealth: Payer: Self-pay | Admitting: Internal Medicine

## 2020-09-14 NOTE — Telephone Encounter (Signed)
disregard

## 2020-09-26 ENCOUNTER — Other Ambulatory Visit: Payer: Self-pay

## 2020-09-26 ENCOUNTER — Telehealth: Payer: Self-pay | Admitting: Internal Medicine

## 2020-09-26 ENCOUNTER — Telehealth (INDEPENDENT_AMBULATORY_CARE_PROVIDER_SITE_OTHER): Payer: BLUE CROSS/BLUE SHIELD | Admitting: Family Medicine

## 2020-09-26 DIAGNOSIS — U071 COVID-19: Secondary | ICD-10-CM

## 2020-09-26 NOTE — Telephone Encounter (Signed)
Disregard

## 2020-09-26 NOTE — Progress Notes (Signed)
Patient ID: Savannah Crawford, female   DOB: [birth year], 65 y.o.   MRN: [id number]  This visit type was conducted due to national recommendations for restrictions regarding the COVID-19 pandemic in an effort to limit this patient's exposure and mitigate transmission in our community.   Virtual Visit via Video Note  I connected with Savannah Crawford on 09/26/20 at 10:00 AM EDT by a video enabled telemedicine application and verified that I am speaking with the correct person using two identifiers.  Location patient: home Location provider:work or home office Persons participating in the virtual visit: patient, provider  I discussed the limitations of evaluation and management by telemedicine and the availability of in person appointments. The patient expressed understanding and agreed to proceed.   HPI:  Ms. Savannah Crawford has COVID-19.  She tested positive earlier today.  Her husband actually tested positive last week.  She developed symptoms last Wednesday of some nasal congestion followed by cough.  No dyspnea.  Denies any nausea, vomiting, or diarrhea.  Her symptoms are gradually getting better.  No fever at this time.  She has been taking over-the-counter medication with Alka-Seltzer plus cold medication.  She does have multiple comorbidities including hypertension, history of diastolic heart failure, type 2 diabetes, chronic kidney disease.  She has been fully immunized.   ROS: See pertinent positives and negatives per HPI.  Past Medical History:  Diagnosis Date   Anemia of chronic disease 11/17/2012   not on meds at this time   At risk for heart disease - +stres test 2012 followed by [facility], [place] with neg cardiac cath 03/2010 (non-obstructive CAD) per review of records, echo 02/2010 normal LVF 01/15/2012   Chronic kidney disease (CKD), stage IV (severe) ([place]) 01/09/2012   Diabetes mellitus with renal manifestations, uncontrolled ([place])    on meds   Diabetic neuropathy ([place]) [id number]    Diastolic heart failure - stage 1 per review of echo results from 02/2010 01/15/2012   GERD (gastroesophageal reflux disease)    OTC meds for tx   Hyperlipidemia    on meds   Hypertension    on meds   Obesity    Peripheral arterial disease? Eval by vascular 2013 with no LE PAD and advised to stop pletal    Severe obesity (BMI >= 40) ([facility]) 11/13/2012   Toe ulcer, right ([facility])    Ulcer of toe of left foot ([place])    treated by [facility] per patient   Venous stasis of lower extremity 03/10/2012    Past Surgical History:  Procedure Laterality Date   COLONOSCOPY  2013/2014   [place]- records not available    REFRACTIVE SURGERY Bilateral    TOE AMPUTATION Right 2008   RIGHT     Family History  Problem Relation Age of Onset   Diabetes Mother    Hyperlipidemia Mother    Hypertension Mother    Heart attack Mother    Stroke Mother    Diabetes Sister    Breast cancer Sister 11   Hypertension Brother    Diabetes Sister    Diabetes Sister    Obesity Other    Hypertension Other    Hyperlipidemia Other    Stroke Other    Heart attack Other    Colon polyps Neg Hx    Colon cancer Neg Hx    Esophageal cancer Neg Hx    Rectal cancer Neg Hx    Stomach cancer Neg Hx     SOCIAL HX: Non-smoker   Current  Outpatient Medications:    atorvastatin (LIPITOR) 20 MG tablet, TAKE 1 TABLET BY MOUTH EVERY DAY, Disp: 90 tablet, Rfl: 1   Blood Glucose Monitoring Suppl (FREESTYLE LITE) DEVI, Use to check sugar 2 times daily, Disp: 1 each, Rfl: 0   furosemide (LASIX) 40 MG tablet, TAKE 1 TABLET BY MOUTH TWICE A DAY, Disp: 180 tablet, Rfl: 1   glucose blood (FREESTYLE LITE) test strip, Use as instructed to check the sugar 3 times daily, Disp: 200 each, Rfl: 5   insulin aspart (NOVOLOG FLEXPEN) 100 UNIT/ML FlexPen, Inject up to 90 units daily as instructed., Disp: 60 mL, Rfl: 1   Insulin Glargine (BASAGLAR KWIKPEN) 100 UNIT/ML, INJECT UP TO 80 UNITS A DAY AT BEDTIME, Disp: 75 mL, Rfl: 1   Insulin Pen  Needle (B-D UF III MINI PEN NEEDLES) 31G X 5 MM MISC, USE AS DIRECTED - 5X A DAY., Disp: 100 each, Rfl: 17   lisinopril (ZESTRIL) 10 MG tablet, TAKE 1 TABLET BY MOUTH EVERY DAY, Disp: 90 tablet, Rfl: 1   metoprolol succinate (TOPROL-XL) 25 MG 24 hr tablet, TAKE 1 TABLET BY MOUTH EVERY DAY, Disp: 90 tablet, Rfl: 1   Semaglutide,0.25 or 0.'5MG'$ /DOS, (OZEMPIC, 0.25 OR 0.5 MG/DOSE,) 2 MG/1.5ML SOPN, Inject 0.5 mg into the skin once a week., Disp: 4.5 mL, Rfl: 3  EXAM:  VITALS per patient if applicable:  GENERAL: alert, oriented, appears well and in no acute distress  HEENT: atraumatic, conjunttiva clear, no obvious abnormalities on inspection of external nose and ears  NECK: normal movements of the head and neck  LUNGS: on inspection no signs of respiratory distress, breathing rate appears normal, no obvious gross SOB, gasping or wheezing  CV: no obvious cyanosis  MS: moves all visible extremities without noticeable abnormality  PSYCH/NEURO: pleasant and cooperative, no obvious depression or anxiety, speech and thought processing grossly intact  ASSESSMENT AND PLAN:  Discussed the following assessment and plan:  COVID-19 infection.  Patient is clinically improving.  Does not have any significant dyspnea.  -We discussed antiviral drugs but she is past 5-day window for that and does seem to be improving so really not indicated. -Reviewed CDC guidelines for isolation -Follow-up promptly for any fever, dyspnea, or other concerns -We discussed using over-the-counter medications such as Delsym for cough.     I discussed the assessment and treatment plan with the patient. The patient was provided an opportunity to ask questions and all were answered. The patient agreed with the plan and demonstrated an understanding of the instructions.   The patient was advised to call back or seek an in-person evaluation if the symptoms worsen or if the condition fails to improve as  anticipated.     Carolann Littler, MD

## 2020-11-01 ENCOUNTER — Ambulatory Visit: Payer: BLUE CROSS/BLUE SHIELD | Admitting: Internal Medicine

## 2020-11-23 ENCOUNTER — Other Ambulatory Visit: Payer: Self-pay | Admitting: Internal Medicine

## 2020-11-23 DIAGNOSIS — E1169 Type 2 diabetes mellitus with other specified complication: Secondary | ICD-10-CM

## 2020-11-23 DIAGNOSIS — E1159 Type 2 diabetes mellitus with other circulatory complications: Secondary | ICD-10-CM

## 2020-11-23 DIAGNOSIS — I152 Hypertension secondary to endocrine disorders: Secondary | ICD-10-CM

## 2020-12-17 ENCOUNTER — Other Ambulatory Visit: Payer: Self-pay | Admitting: Internal Medicine

## 2020-12-17 DIAGNOSIS — E1169 Type 2 diabetes mellitus with other specified complication: Secondary | ICD-10-CM

## 2020-12-17 DIAGNOSIS — E1122 Type 2 diabetes mellitus with diabetic chronic kidney disease: Secondary | ICD-10-CM

## 2020-12-23 ENCOUNTER — Other Ambulatory Visit: Payer: Self-pay

## 2020-12-23 ENCOUNTER — Ambulatory Visit (INDEPENDENT_AMBULATORY_CARE_PROVIDER_SITE_OTHER): Payer: Medicare Other | Admitting: *Deleted

## 2020-12-23 DIAGNOSIS — Z23 Encounter for immunization: Secondary | ICD-10-CM

## 2021-01-06 ENCOUNTER — Ambulatory Visit (INDEPENDENT_AMBULATORY_CARE_PROVIDER_SITE_OTHER): Payer: Medicare Other | Admitting: Internal Medicine

## 2021-01-06 ENCOUNTER — Encounter: Payer: Self-pay | Admitting: Internal Medicine

## 2021-01-06 VITALS — BP 130/70 | HR 82 | Temp 98.2°F | Wt 306.9 lb

## 2021-01-06 DIAGNOSIS — N184 Chronic kidney disease, stage 4 (severe): Secondary | ICD-10-CM

## 2021-01-06 DIAGNOSIS — E785 Hyperlipidemia, unspecified: Secondary | ICD-10-CM

## 2021-01-06 DIAGNOSIS — E1159 Type 2 diabetes mellitus with other circulatory complications: Secondary | ICD-10-CM

## 2021-01-06 DIAGNOSIS — Z794 Long term (current) use of insulin: Secondary | ICD-10-CM | POA: Diagnosis not present

## 2021-01-06 DIAGNOSIS — E1121 Type 2 diabetes mellitus with diabetic nephropathy: Secondary | ICD-10-CM | POA: Diagnosis not present

## 2021-01-06 DIAGNOSIS — E1169 Type 2 diabetes mellitus with other specified complication: Secondary | ICD-10-CM | POA: Diagnosis not present

## 2021-01-06 DIAGNOSIS — I152 Hypertension secondary to endocrine disorders: Secondary | ICD-10-CM

## 2021-01-06 DIAGNOSIS — I5033 Acute on chronic diastolic (congestive) heart failure: Secondary | ICD-10-CM

## 2021-01-06 LAB — POCT GLYCOSYLATED HEMOGLOBIN (HGB A1C): Hemoglobin A1C: 8.6 % — AB (ref 4.0–5.6)

## 2021-01-06 NOTE — Progress Notes (Signed)
Established Patient Office Visit     This visit occurred during the SARS-CoV-2 public health emergency.  Safety protocols were in place, including screening questions prior to the visit, additional usage of staff PPE, and extensive cleaning of exam room while observing appropriate contact time as indicated for disinfecting solutions.    CC/Reason for Visit: Follow-up chronic medical conditions  HPI: Savannah Crawford is a 64 y.o. female who is coming in today for the above mentioned reasons. Past Medical History is significant for:  insulin-dependent diabetes has not been well controlled followed by Dr. Cruzita Lederer, chronic kidney disease stage IV with a baseline creatinine around 1.72 followed by nephrologist out of [facility], hyperlipidemia well controlled on Lipitor, morbid obesity with a BMI of about 44, well-controlled hypertension, peripheral arterial disease status post right second toe amputation, heart failure with preserved ejection fraction.  Since I last saw her she has been feeling well and has no acute concerns.  She is overdue for COVID booster but did have her flu vaccine.  She has not followed with Dr. Cruzita Lederer in some time.  She states she has been unable to get an appointment.  She will be due for her diabetic eye exam in February of next year.  She continues to follow routinely with nephrology for her advanced chronic kidney disease.  She is overdue for labs.   Past Medical/Surgical History: Past Medical History:  Diagnosis Date   Anemia of chronic disease 11/17/2012   not on meds at this time   At risk for heart disease - +stres test 2012 followed by [facility], [place] with neg cardiac cath 03/2010 (non-obstructive CAD) per review of records, echo 02/2010 normal LVF 01/15/2012   Chronic kidney disease (CKD), stage IV (severe) ([place]) 01/09/2012   Diabetes mellitus with renal manifestations, uncontrolled    on meds   Diabetic neuropathy ([place]) 57/84/6962   Diastolic heart  failure - stage 1 per review of echo results from 02/2010 01/15/2012   GERD (gastroesophageal reflux disease)    OTC meds for tx   Hyperlipidemia    on meds   Hypertension    on meds   Obesity    Peripheral arterial disease? Eval by vascular 2013 with no LE PAD and advised to stop pletal    Severe obesity (BMI >= 40) ([place]) 11/13/2012   Toe ulcer, right (HCC)    Ulcer of toe of left foot ([place])    treated by [facility] per patient   Venous stasis of lower extremity 03/10/2012    Past Surgical History:  Procedure Laterality Date   COLONOSCOPY  2013/2014   [place]- records not available    REFRACTIVE SURGERY Bilateral    TOE AMPUTATION Right 2008   RIGHT     Social History:  reports that she quit smoking about 21 years ago. Her smoking use included cigarettes. She has never used smokeless tobacco. She reports current alcohol use of about 4.0 standard drinks per week. She reports that she does not currently use drugs.  Allergies: No Known Allergies  Family History:  Family History  Problem Relation Age of Onset   Diabetes Mother    Hyperlipidemia Mother    Hypertension Mother    Heart attack Mother    Stroke Mother    Diabetes Sister    Breast cancer Sister 19   Hypertension Brother    Diabetes Sister    Diabetes Sister    Obesity Other    Hypertension Other  Hyperlipidemia Other    Stroke Other    Heart attack Other    Colon polyps Neg Hx    Colon cancer Neg Hx    Esophageal cancer Neg Hx    Rectal cancer Neg Hx    Stomach cancer Neg Hx      Current Outpatient Medications:    atorvastatin (LIPITOR) 20 MG tablet, TAKE 1 TABLET BY MOUTH EVERY DAY, Disp: 90 tablet, Rfl: 0   Blood Glucose Monitoring Suppl (FREESTYLE LITE) DEVI, Use to check sugar 2 times daily, Disp: 1 each, Rfl: 0   furosemide (LASIX) 40 MG tablet, TAKE 1 TABLET BY MOUTH TWICE A DAY, Disp: 180 tablet, Rfl: 1   glucose blood (FREESTYLE LITE) test strip, Use as instructed to check the sugar 3 times  daily, Disp: 200 each, Rfl: 5   insulin aspart (NOVOLOG FLEXPEN) 100 UNIT/ML FlexPen, Inject up to 90 units daily as instructed., Disp: 60 mL, Rfl: 1   Insulin Glargine (BASAGLAR KWIKPEN) 100 UNIT/ML, INJECT UP TO 80 UNITS A DAY AT BEDTIME, Disp: 75 mL, Rfl: 0   Insulin Pen Needle (B-D UF III MINI PEN NEEDLES) 31G X 5 MM MISC, USE AS DIRECTED - 5X A DAY., Disp: 100 each, Rfl: 17   lisinopril (ZESTRIL) 10 MG tablet, TAKE 1 TABLET BY MOUTH EVERY DAY, Disp: 90 tablet, Rfl: 1   metoprolol succinate (TOPROL-XL) 25 MG 24 hr tablet, TAKE 1 TABLET BY MOUTH EVERY DAY, Disp: 90 tablet, Rfl: 0   Semaglutide,0.25 or 0.5MG /DOS, (OZEMPIC, 0.25 OR 0.5 MG/DOSE,) 2 MG/1.5ML SOPN, Inject 0.5 mg into the skin once a week., Disp: 4.5 mL, Rfl: 3  Review of Systems:  Constitutional: Denies fever, chills, diaphoresis, appetite change and fatigue.  HEENT: Denies photophobia, eye pain, redness, hearing loss, ear pain, congestion, sore throat, rhinorrhea, sneezing, mouth sores, trouble swallowing, neck pain, neck stiffness and tinnitus.   Respiratory: Denies SOB, DOE, cough, chest tightness,  and wheezing.   Cardiovascular: Denies chest pain, palpitations and leg swelling.  Gastrointestinal: Denies nausea, vomiting, abdominal pain, diarrhea, constipation, blood in stool and abdominal distention.  Genitourinary: Denies dysuria, urgency, frequency, hematuria, flank pain and difficulty urinating.  Endocrine: Denies: hot or cold intolerance, sweats, changes in hair or nails, polyuria, polydipsia. Musculoskeletal: Denies myalgias, back pain, joint swelling, arthralgias and gait problem.  Skin: Denies pallor, rash and wound.  Neurological: Denies dizziness, seizures, syncope, weakness, light-headedness, numbness and headaches.  Hematological: Denies adenopathy. Easy bruising, personal or family bleeding history  Psychiatric/Behavioral: Denies suicidal ideation, mood changes, confusion, nervousness, sleep disturbance and  agitation    Physical Exam: Vitals:   01/06/21 1113  BP: 130/70  Pulse: 82  Temp: 98.2 F (36.8 C)  TempSrc: Oral  SpO2: 99%  Weight: (!) 306 lb 14.4 oz (139.2 kg)    Body mass index is 44.04 kg/m.   Constitutional: NAD, calm, comfortable, obese Eyes: PERRL, lids and conjunctivae normal, wears corrective lenses ENMT: Mucous membranes are moist.  Respiratory: clear to auscultation bilaterally, no wheezing, no crackles. Normal respiratory effort. No accessory muscle use.  Cardiovascular: Regular rate and rhythm, no murmurs / rubs / gallops. No extremity edema.  Neurologic: Grossly intact and nonfocal Psychiatric: Normal judgment and insight. Alert and oriented x 3. Normal mood.    Impression and Plan:  Type 2 diabetes mellitus with diabetic nephropathy, with long-term current use of insulin ([place])  - Plan: POCT glycosylated hemoglobin (Hb A1C), CBC with Differential/Platelet, Comprehensive metabolic panel -K4M has increased to 8.6, since she is followed  by endocrinology and insulin-dependent I will advise her to contact their office and arrange follow-up.  Hyperlipidemia associated with type 2 diabetes mellitus ([place])  - Plan: Lipid panel -She is overdue for lipids, at last check LDL was 65 in October 20. -She is currently on atorvastatin.  Hypertension associated with diabetes ([place]) -Blood pressures well controlled.  Chronic kidney disease (CKD), stage IV (severe) (HCC) -Baseline creatinine around 1.6-1.7, followed routinely by nephrology.  Severe obesity (BMI >= 40) (HCC) -Discussed healthy lifestyle, including increased physical activity and better food choices to promote weight loss.   Time spent: 32 minutes reviewing chart, interviewing and examining patient and formulating plan of care.   Patient Instructions  -Nice seeing you today!!  -Return fasting for labs.  -Schedule follow up in 6 months.  -Schedule follow up with your endocrinologist.   Lelon Frohlich, MD [facility]

## 2021-01-06 NOTE — Patient Instructions (Signed)
-  Nice seeing you today!!  -Return fasting for labs.  -Schedule follow up in 6 months.  -Schedule follow up with your endocrinologist.

## 2021-01-10 ENCOUNTER — Other Ambulatory Visit (INDEPENDENT_AMBULATORY_CARE_PROVIDER_SITE_OTHER): Payer: Medicare Other

## 2021-01-10 DIAGNOSIS — Z794 Long term (current) use of insulin: Secondary | ICD-10-CM | POA: Diagnosis not present

## 2021-01-10 DIAGNOSIS — E785 Hyperlipidemia, unspecified: Secondary | ICD-10-CM | POA: Diagnosis not present

## 2021-01-10 DIAGNOSIS — E1121 Type 2 diabetes mellitus with diabetic nephropathy: Secondary | ICD-10-CM | POA: Diagnosis not present

## 2021-01-10 DIAGNOSIS — E1169 Type 2 diabetes mellitus with other specified complication: Secondary | ICD-10-CM

## 2021-01-10 LAB — COMPREHENSIVE METABOLIC PANEL
ALT: 22 U/L (ref 0–35)
AST: 18 U/L (ref 0–37)
Albumin: 3.9 g/dL (ref 3.5–5.2)
Alkaline Phosphatase: 104 U/L (ref 39–117)
BUN: 18 mg/dL (ref 6–23)
CO2: 29 mEq/L (ref 19–32)
Calcium: 9.1 mg/dL (ref 8.4–10.5)
Chloride: 104 mEq/L (ref 96–112)
Creatinine, Ser: 1.65 mg/dL — ABNORMAL HIGH (ref 0.40–1.20)
GFR: 32.5 mL/min — ABNORMAL LOW (ref >60.00)
Glucose, Bld: 89 mg/dL (ref 70–99)
Potassium: 4.2 mEq/L (ref 3.5–5.1)
Sodium: 139 mEq/L (ref 135–145)
Total Bilirubin: 0.3 mg/dL (ref 0.2–1.2)
Total Protein: 7.3 g/dL (ref 6.0–8.3)

## 2021-01-10 LAB — LIPID PANEL
Cholesterol: 128 mg/dL (ref 0–200)
HDL: 49.5 mg/dL (ref >39.00)
LDL Cholesterol: 62 mg/dL (ref 0–99)
NonHDL: 78.32
Total CHOL/HDL Ratio: 3
Triglycerides: 83 mg/dL (ref 0.0–149.0)
VLDL: 16.6 mg/dL (ref 0.0–40.0)

## 2021-01-10 LAB — CBC WITH DIFFERENTIAL/PLATELET
Basophils Absolute: 0.1 10*3/uL (ref 0.0–0.1)
Basophils Relative: 1.4 % (ref 0.0–3.0)
Eosinophils Absolute: 0.2 10*3/uL (ref 0.0–0.7)
Eosinophils Relative: 3 % (ref 0.0–5.0)
HCT: 35.4 % — ABNORMAL LOW (ref 36.0–46.0)
Hemoglobin: 11.3 g/dL — ABNORMAL LOW (ref 12.0–15.0)
Lymphocytes Relative: 26.7 % (ref 12.0–46.0)
Lymphs Abs: 2.1 10*3/uL (ref 0.7–4.0)
MCHC: 31.9 g/dL (ref 30.0–36.0)
MCV: 85.1 fl (ref 78.0–100.0)
Monocytes Absolute: 0.8 10*3/uL (ref 0.1–1.0)
Monocytes Relative: 10.2 % (ref 3.0–12.0)
Neutro Abs: 4.7 10*3/uL (ref 1.4–7.7)
Neutrophils Relative %: 58.7 % (ref 43.0–77.0)
Platelets: 250 10*3/uL (ref 150.0–400.0)
RBC: 4.17 Mil/uL (ref 3.87–5.11)
RDW: 14.6 % (ref 11.5–15.5)
WBC: 7.9 10*3/uL (ref 4.0–10.5)

## 2021-01-20 ENCOUNTER — Ambulatory Visit (INDEPENDENT_AMBULATORY_CARE_PROVIDER_SITE_OTHER): Payer: Medicare Other | Admitting: Internal Medicine

## 2021-01-20 ENCOUNTER — Other Ambulatory Visit: Payer: Self-pay

## 2021-01-20 ENCOUNTER — Encounter: Payer: Self-pay | Admitting: Internal Medicine

## 2021-01-20 VITALS — BP 120/72 | HR 79 | Ht 70.0 in | Wt 306.4 lb

## 2021-01-20 DIAGNOSIS — E1169 Type 2 diabetes mellitus with other specified complication: Secondary | ICD-10-CM

## 2021-01-20 DIAGNOSIS — E1159 Type 2 diabetes mellitus with other circulatory complications: Secondary | ICD-10-CM

## 2021-01-20 DIAGNOSIS — E1165 Type 2 diabetes mellitus with hyperglycemia: Secondary | ICD-10-CM | POA: Diagnosis not present

## 2021-01-20 DIAGNOSIS — E785 Hyperlipidemia, unspecified: Secondary | ICD-10-CM

## 2021-01-20 MED ORDER — SEMAGLUTIDE (2 MG/DOSE) 8 MG/3ML ~~LOC~~ SOPN: 2.0000 mg | PEN_INJECTOR | SUBCUTANEOUS | 3 refills | Status: DC

## 2021-01-20 MED ORDER — ACCU-CHEK SOFTCLIX LANCETS MISC: 3 refills | Status: DC

## 2021-01-20 MED ORDER — ACCU-CHEK GUIDE VI STRP: ORAL_STRIP | 3 refills | Status: DC

## 2021-01-20 MED ORDER — ACCU-CHEK GUIDE ME W/DEVICE KIT: PACK | 0 refills | Status: DC

## 2021-01-20 MED ORDER — OZEMPIC (1 MG/DOSE) 4 MG/3ML ~~LOC~~ SOPN: 1.0000 mg | PEN_INJECTOR | SUBCUTANEOUS | 3 refills | Status: DC

## 2021-01-20 NOTE — Progress Notes (Signed)
Subjective:     Patient ID: Savannah Crawford, female   DOB: 1955-07-10, 65 y.o.   MRN: [id number]  This visit occurred during the SARS-CoV-2 public health emergency.  Safety protocols were in place, including screening questions prior to the visit, additional usage of staff PPE, and extensive cleaning of exam room while observing appropriate contact time as indicated for disinfecting solutions.   HPI Savannah Crawford is a 65 y.o. woman returning for f/u for DM2, insulin-dependent, uncontrolled, with complications (CAD, CKD, diabetes neuropathy, diabetic retinopathy, PVD, amputated toe-R). Last visit 6 months ago.  Interim history: No increased urination, blurry vision, chest pain.  She initially had nausea with Ozempic but this has improved. She has occasional SOB with exertion and also leg weakness.  She had COVID-19 in 09/2020.  This is resolved.  Reviewed HbA1c levels: Lab Results  Component Value Date   HGBA1C 8.6 (A) 01/06/2021   HGBA1C 7.8 (A) 07/26/2020   HGBA1C 9.3 (A) 04/13/2020   She is on: - Basaglar 40 units in a.m. and 20-30 units at night (may forget) - Novolog dose: (15-25) - (15-20) - (15-20) units before meals - Novolog Sliding scale: - 150- 165: + 1 unit  - 166- 180: + 2 units  - 181- 195: + 3 units  - 196- 210: + 4 units  - >210: + 5 units  Take NovoLog 15 minutes before every meal. - Ozempic 0.5 mg weekly- started 03/2020  I again suggested Ozempic 0.5 mg weekly in the past, also: 11/2018 >> did not start as she did not want to add another med... In 03/2019, she did try to start it but this was expensive. She was on metformin in the past, but stopped 2/2 CKD.  Tried Victoza before >> cannot remember SE.  Checks sugars 1-2 a day: - am: 98-146, 220  >> 106-142, 155, 180 >> 85-177 (higher if forgot insulin at night) >> 110-147 - after b'fast: 128-183 >> n/c - prelunch:185 >> 170-180 >> n/c  - after lunch: 236 >> n/c - predinner: 230-240 >> 144, 153 >> n/c - after dinner:  77 >> 119-195 >> 170-212 - bedtime: n/c >> 135-217, 366 >> n/c  Lowest: 68 >> ... 85 >> 110 Highest sugar 220 >> 180 >> 195 >> 212  Meter: Freestyle Lite  + CKD-sees nephrology: Lab Results  Component Value Date   BUN 18 01/10/2021   CREATININE 1.65 (H) 01/10/2021  On lisinopril 10.  + HL; Latest lipids: Lab Results  Component Value Date   CHOL 128 01/10/2021   HDL 49.50 01/10/2021   LDLCALC 62 01/10/2021   TRIG 83.0 01/10/2021   CHOLHDL 3 01/10/2021  On Lipitor 20.  Last eye exam 06/2020: + DR, + cataract reportedly.   + Stable numbness and tingling in her feet.  She sees podiatry (Dr. Melony Overly).  She has a history of left third toe ulcer. She has a history of nonobstructive CAD, with positive stress test but a negative cardiac cath 2012, and also has diastolic heart failure. She has HTN, venous stasis, vitamin D deficiency. She continues to see podiatry.  She has a history of skin graft fourth toe ulcer  She denies family history of medullary thyroid cancer and no personal history of pancreatitis.  Review of Systems + see HPI Neurological: no tremors/+ numbness/+ tingling/+ dizziness  I reviewed pt's medications, allergies, PMH, social hx, family hx, and changes were documented in the history of present illness. Otherwise, unchanged from my initial visit note.  Past  Medical History:  Diagnosis Date   Anemia of chronic disease 11/17/2012   not on meds at this time   At risk for heart disease - +stres test 2012 followed by [facility], [place] with neg cardiac cath 03/2010 (non-obstructive CAD) per review of records, echo 02/2010 normal LVF 01/15/2012   Chronic kidney disease (CKD), stage IV (severe) ([place]) 01/09/2012   Diabetes mellitus with renal manifestations, uncontrolled    on meds   Diabetic neuropathy ([place]) 63/02/6008   Diastolic heart failure - stage 1 per review of echo results from 02/2010 01/15/2012   GERD (gastroesophageal reflux disease)    OTC meds for tx    Hyperlipidemia    on meds   Hypertension    on meds   Obesity    Peripheral arterial disease? Eval by vascular 2013 with no LE PAD and advised to stop pletal    Severe obesity (BMI >= 40) (HCC) 11/13/2012   Toe ulcer, right (HCC)    Ulcer of toe of left foot ([place])    treated by [facility] per patient   Venous stasis of lower extremity 03/10/2012   Past Surgical History:  Procedure Laterality Date   COLONOSCOPY  2013/2014   [place]- records not available    REFRACTIVE SURGERY Bilateral    TOE AMPUTATION Right 2008   RIGHT    Social History   Socioeconomic History   Marital status: Married    Spouse name: Not on file   Number of children: Not on file   Years of education: Not on file   Highest education level: Not on file  Occupational History   Not on file  Tobacco Use   Smoking status: Former    Types: Cigarettes    Quit date: 02/20/1999    Years since quitting: 21.9   Smokeless tobacco: Never  Vaping Use   Vaping Use: Never used  Substance and Sexual Activity   Alcohol use: Yes    Alcohol/week: 4.0 standard drinks    Types: 4 Standard drinks or equivalent per week   Drug use: Not Currently   Sexual activity: Not on file  Other Topics Concern   Not on file  Social History Narrative   Not on file   Social Determinants of Health   Financial Resource Strain: Low Risk    Difficulty of Paying Living Expenses: Not hard at all  Food Insecurity: No Food Insecurity   Worried About Charity fundraiser in the Last Year: Never true   [place] in the Last Year: Never true  Transportation Needs: No Transportation Needs   Lack of Transportation (Medical): No   Lack of Transportation (Non-Medical): No  Physical Activity: Insufficiently Active   Days of Exercise per Week: 2 days   Minutes of Exercise per Session: 60 min  Stress: No Stress Concern Present   Feeling of Stress : Not at all  Social Connections: Unknown   Frequency of Communication with Friends and  Family: Patient refused   Frequency of Social Gatherings with Friends and Family: Patient refused   Attends Religious Services: More than 4 times per year   Active Member of [employer] or Organizations: Yes   Attends Music therapist: More than 4 times per year   Marital Status: Married  Human resources officer Violence: Not At Risk   Fear of Current or Ex-Partner: No   Emotionally Abused: No   Physically Abused: No   Sexually Abused: No   Current Outpatient Medications  on File Prior to Visit  Medication Sig Dispense Refill   atorvastatin (LIPITOR) 20 MG tablet TAKE 1 TABLET BY MOUTH EVERY DAY 90 tablet 0   Blood Glucose Monitoring Suppl (FREESTYLE LITE) DEVI Use to check sugar 2 times daily 1 each 0   furosemide (LASIX) 40 MG tablet TAKE 1 TABLET BY MOUTH TWICE A DAY 180 tablet 1   glucose blood (FREESTYLE LITE) test strip Use as instructed to check the sugar 3 times daily 200 each 5   insulin aspart (NOVOLOG FLEXPEN) 100 UNIT/ML FlexPen Inject up to 90 units daily as instructed. 60 mL 1   Insulin Glargine (BASAGLAR KWIKPEN) 100 UNIT/ML INJECT UP TO 80 UNITS A DAY AT BEDTIME 75 mL 0   Insulin Pen Needle (B-D UF III MINI PEN NEEDLES) 31G X 5 MM MISC USE AS DIRECTED - 5X A DAY. 100 each 17   lisinopril (ZESTRIL) 10 MG tablet TAKE 1 TABLET BY MOUTH EVERY DAY 90 tablet 1   metoprolol succinate (TOPROL-XL) 25 MG 24 hr tablet TAKE 1 TABLET BY MOUTH EVERY DAY 90 tablet 0   Semaglutide,0.25 or 0.5MG /DOS, (OZEMPIC, 0.25 OR 0.5 MG/DOSE,) 2 MG/1.5ML SOPN Inject 0.5 mg into the skin once a week. 4.5 mL 3   No current facility-administered medications on file prior to visit.   No Known Allergies Family History  Problem Relation Age of Onset   Diabetes Mother    Hyperlipidemia Mother    Hypertension Mother    Heart attack Mother    Stroke Mother    Diabetes Sister    Breast cancer Sister 3   Hypertension Brother    Diabetes Sister    Diabetes Sister    Obesity Other    Hypertension  Other    Hyperlipidemia Other    Stroke Other    Heart attack Other    Colon polyps Neg Hx    Colon cancer Neg Hx    Esophageal cancer Neg Hx    Rectal cancer Neg Hx    Stomach cancer Neg Hx    Objective:   Physical Exam BP 120/72 (BP Location: Right Arm, Patient Position: Sitting, Cuff Size: Normal)   Pulse 79   Ht 5\' 10"  (1.778 m)   Wt (!) 306 lb 6.4 oz (139 kg)   SpO2 97%   BMI 43.96 kg/m   Wt Readings from Last 3 Encounters:  01/20/21 (!) 306 lb 6.4 oz (139 kg)  01/06/21 (!) 306 lb 14.4 oz (139.2 kg)  07/26/20 (!) 309 lb 12.8 oz (140.5 kg)   Constitutional: overweight, in NAD Eyes: PERRLA, EOMI, no exophthalmos ENT: moist mucous membranes, no thyromegaly, no cervical lymphadenopathy Cardiovascular: RRR, No MRG, + B LE edema L>R Respiratory: CTA B Gastrointestinal: abdomen soft, NT, ND, BS+ Musculoskeletal: no deformities, strength intact in all 4 Skin: moist, warm, no rashes Neurological: no tremor with outstretched hands, DTR normal in all 4  Assessment:     1. DM2, insulin-dependent, uncontrolled, with complications - nonobstructive CAD - CKD - diabetes neuropathy - diabetic retinopathy - PVD - R amputated toe  2. HL  3. Obesity class 3    Plan:     1. Pt with history of uncontrolled type 2 diabetes, on basal/bolus insulin regimen and also weekly GLP-1 receptor agonist.  Initially she had nausea with Ozempic (as she started directly at 0.5 mg weekly dose, as she misunderstood instructions) but this is now tolerated well.  At last visit, sugars were better, but with still occasional values above  target.  However, she was still taking NovoLog after meals, despite repeated verbal and written instructions that she needed to take it 15 minutes before every meal.  I again strongly advised her to do so.  We did decrease the dose of Basaglar at night since sugars improved and we also decreased the NovoLog dose with every meal.  She was not checking sugars before lunch  and dinner and I advised her to start doing so.  At that time, HbA1c was better, at 7.8%. -Since last visit she had another HbA1c that was higher, at 8.6% -At this visit, she does not bring the blood sugar log or meter, but per her recall, sugars closer to goal in the morning and slightly above goal after dinner. She is still forgetting to take Novolog 15 min before a meal, and mostly takes it after she already started eating.  I advised him to continue to try to will be done nearly before the meal.  Also, she tells me that she may forget to take the [place] at night. - at this visit, I advised her to increase Ozempic to 1 mg weekly -she had mild nausea with a lower dose, but this resolved.  I did advise her that we may need to decrease her NovoLog doses after the increase in dose after sugars improve. -  I advised her to: Patient Instructions  Please continue: - Basaglar 40 units in a.m. and 20-30 units at night - Novolog dose: (15-25) - (15-20) - (15-20) units before meals - Novolog Sliding scale: - 150- 165: + 1 unit  - 166- 180: + 2 units  - 181- 195: + 3 units  - 196- 210: + 4 units  - >210: + 5 units  Take NovoLog 15 minutes before every meal.  Please increase: - Ozempic 1 mg weekly  Please return in 3 months with your sugar log.   - advised to check sugars at different times of the day - 4x a day, rotating check times - advised for yearly eye exams >> she is UTD - return to clinic in 3 months  2. HL -Reviewed latest lipid panel from 12/2020: All fractions at goal: Lab Results  Component Value Date   CHOL 128 01/10/2021   HDL 49.50 01/10/2021   LDLCALC 62 01/10/2021   TRIG 83.0 01/10/2021   CHOLHDL 3 01/10/2021  -Continues Lipitor 20 mg daily without side effects  3.  Obesity class III -We will continue Ozempic which should also help with weight loss - will increase the dose today. -At last visit, she lost 1 pound.  Since then, she lost 3 pounds.  Philemon Kingdom, MD  PhD [facility] Endocrinology

## 2021-01-20 NOTE — Patient Instructions (Addendum)
Please continue: - Basaglar 40 units in a.m. and 20-30 units at night - Novolog dose: (15-25) - (15-20) - (15-20) units before meals - Novolog Sliding scale: - 150- 165: + 1 unit  - 166- 180: + 2 units  - 181- 195: + 3 units  - 196- 210: + 4 units  - >210: + 5 units  Take NovoLog 15 minutes before every meal.  Please increase: - Ozempic 1 mg weekly  Please return in 3 months with your sugar log.

## 2021-02-07 ENCOUNTER — Ambulatory Visit: Payer: Medicare Other | Admitting: Internal Medicine

## 2021-02-07 ENCOUNTER — Other Ambulatory Visit: Payer: BLUE CROSS/BLUE SHIELD

## 2021-02-16 ENCOUNTER — Telehealth: Payer: Self-pay | Admitting: Internal Medicine

## 2021-02-16 DIAGNOSIS — E1165 Type 2 diabetes mellitus with hyperglycemia: Secondary | ICD-10-CM

## 2021-02-16 DIAGNOSIS — E1122 Type 2 diabetes mellitus with diabetic chronic kidney disease: Secondary | ICD-10-CM

## 2021-02-16 MED ORDER — BASAGLAR KWIKPEN 100 UNIT/ML ~~LOC~~ SOPN: 80.0000 [IU] | PEN_INJECTOR | Freq: Every day | SUBCUTANEOUS | 0 refills | Status: DC

## 2021-02-16 MED ORDER — NOVOLOG FLEXPEN 100 UNIT/ML ~~LOC~~ SOPN: PEN_INJECTOR | SUBCUTANEOUS | 1 refills | Status: DC

## 2021-02-16 MED ORDER — BD PEN NEEDLE MINI U/F 31G X 5 MM MISC: 17 refills | Status: DC

## 2021-02-16 MED ORDER — ACCU-CHEK GUIDE VI STRP: ORAL_STRIP | 3 refills | Status: DC

## 2021-02-16 NOTE — Telephone Encounter (Signed)
Patient called needing the below prescription to be sent to the below pharmacy.  insulin aspart (NOVOLOG FLEXPEN) 100 UNIT/ML FlexPen  Insulin Glargine (BASAGLAR KWIKPEN) 100 UNIT/ML  Insulin Pen Needle (B-D UF III MINI PEN NEEDLES) 31G X 5 MM MISCglucose blood (ACCU-CHEK GUIDE) test strip  Blood Glucose Monitoring Suppl (ACCU-CHEK GUIDE ME) w/Device KIT  Please forward the above to NEW Pharmacy:  [place], [place]

## 2021-02-16 NOTE — Telephone Encounter (Signed)
Rx sent to new pharmacy.

## 2021-02-17 MED ORDER — ACCU-CHEK SOFTCLIX LANCETS MISC: 3 refills | Status: DC

## 2021-02-17 MED ORDER — ACCU-CHEK GUIDE ME W/DEVICE KIT
PACK | 0 refills | Status: AC
Start: 2021-02-17 — End: ?

## 2021-02-17 NOTE — Telephone Encounter (Signed)
Patient called to advise that she also needs the actual Accu-check Guide Me meter kit and the additional lancets to be sent to the [facility] in [place].    Patient advises that she meter and supplies sent to [facility] on 01/20/21 was not received by her. So she des not have the meter or strips  Call back # [phone]

## 2021-02-17 NOTE — Telephone Encounter (Signed)
Rx sent to preferred pharmacy.

## 2021-02-17 NOTE — Addendum Note (Signed)
Addended by: Lauralyn Primes on: 02/17/2021 09:47 AM   Modules accepted: Orders

## 2021-02-20 ENCOUNTER — Other Ambulatory Visit: Payer: Self-pay | Admitting: Internal Medicine

## 2021-02-20 DIAGNOSIS — I152 Hypertension secondary to endocrine disorders: Secondary | ICD-10-CM

## 2021-02-20 DIAGNOSIS — E1159 Type 2 diabetes mellitus with other circulatory complications: Secondary | ICD-10-CM

## 2021-02-21 ENCOUNTER — Telehealth: Payer: Self-pay | Admitting: Internal Medicine

## 2021-02-21 NOTE — Telephone Encounter (Signed)
Patient calling in with respiratory symptoms: Shortness of breath, chest pain, palpitations or other red words send to Triage  Does the patient have a fever over 100, cough, congestion, sore throat, runny nose, lost of taste/smell (please list symptoms that patient has)?cough and runny nose  What date did symptoms start?02-12-2021 (If over 5 days ago, pt may be scheduled for in person visit)  Have you tested for Covid in the last 5 days? Yes   If yes, was it positive []  OR negative [x] ? If positive in the last 5 days, please schedule virtual visit now. If negative, schedule for an in person OV with the next available provider if PCP has no openings. Please also let patient know they will be tested again (follow the script below)  "you will have to arrive 40mins prior to your appt time to be Covid tested. Please park in back of office at the cone & call [phone] to let the staff know you have arrived. A staff member will meet you at your car to do a rapid covid test. Once the test has resulted you will be notified by phone of your results to determine if appt will remain an in person visit or be converted to a virtual/phone visit. If you arrive less than 65mins before your appt time, your visit will be automatically converted to virtual & any recommended testing will happen AFTER the visit." Pt has an appt with dr Elease Hashimoto on 02-23-2020  THINGS TO REMEMBER  If no availability for virtual visit in office,  please schedule another [place] office  If no availability at another [place] office, please instruct patient that they can schedule an evisit or virtual visit through their mychart account. Visits up to 8pm  patients can be seen in office 5 days after positive COVID test

## 2021-02-22 ENCOUNTER — Ambulatory Visit: Payer: Medicare Other | Admitting: Family Medicine

## 2021-02-22 ENCOUNTER — Ambulatory Visit (INDEPENDENT_AMBULATORY_CARE_PROVIDER_SITE_OTHER): Payer: Medicare Other | Admitting: Family Medicine

## 2021-02-22 VITALS — BP 150/74 | HR 78 | Temp 98.5°F | Ht 70.0 in | Wt 309.0 lb

## 2021-02-22 DIAGNOSIS — J019 Acute sinusitis, unspecified: Secondary | ICD-10-CM

## 2021-02-22 MED ORDER — FLUCONAZOLE 150 MG PO TABS: 150.0000 mg | ORAL_TABLET | Freq: Once | ORAL | 0 refills | Status: AC

## 2021-02-22 MED ORDER — AMOXICILLIN-POT CLAVULANATE 875-125 MG PO TABS: 1.0000 | ORAL_TABLET | Freq: Two times a day (BID) | ORAL | 0 refills | Status: DC

## 2021-02-22 NOTE — Patient Instructions (Signed)
Consider over the counter Mucinex 1200 mg twice daily.

## 2021-02-22 NOTE — Progress Notes (Signed)
Established Patient Office Visit  Subjective:  Patient ID: Savannah Crawford, female    DOB: 03-Dec-1955  Age: 66 y.o. MRN: [id number]  CC:  Chief Complaint  Patient presents with   Cough    Symptoms began 12/25. Denies fever or SOB; productive cough; has taken AlkaSelter Plus yesterday; tested negative for covid 02/20/21; did not test sooner than that.   Nasal Congestion    HPI Savannah Crawford presents for persistent upper respiratory symptoms.  Onset around Christmas eve.  She has symptoms of cough, intermittent sneezing, sinus pressure and some colored nasal discharge.  No fever.  Home COVID test negative on Monday.  No dyspnea.  She feels that she is not improving any.  She does not have any chronic lung disease.  Does have issues of type 2 diabetes and hypertension.  Non-smoker.  Past Medical History:  Diagnosis Date   Anemia of chronic disease 11/17/2012   not on meds at this time   At risk for heart disease - +stres test 2012 followed by [facility], [place] with neg cardiac cath 03/2010 (non-obstructive CAD) per review of records, echo 02/2010 normal LVF 01/15/2012   Chronic kidney disease (CKD), stage IV (severe) ([place]) 01/09/2012   Diabetes mellitus with renal manifestations, uncontrolled    on meds   Diabetic neuropathy ([place]) 97/41/6384   Diastolic heart failure - stage 1 per review of echo results from 02/2010 01/15/2012   GERD (gastroesophageal reflux disease)    OTC meds for tx   Hyperlipidemia    on meds   Hypertension    on meds   Obesity    Peripheral arterial disease? Eval by vascular 2013 with no LE PAD and advised to stop pletal    Severe obesity (BMI >= 40) (HCC) 11/13/2012   Toe ulcer, right (HCC)    Ulcer of toe of left foot ([place])    treated by [facility] per patient   Venous stasis of lower extremity 03/10/2012    Past Surgical History:  Procedure Laterality Date   COLONOSCOPY  2013/2014   [place]- [place] available    REFRACTIVE SURGERY Bilateral     TOE AMPUTATION Right 2008   RIGHT     Family History  Problem Relation Age of Onset   Diabetes Mother    Hyperlipidemia Mother    Hypertension Mother    Heart attack Mother    Stroke Mother    Diabetes Sister    Breast cancer Sister 59   Hypertension Brother    Diabetes Sister    Diabetes Sister    Obesity Other    Hypertension Other    Hyperlipidemia Other    Stroke Other    Heart attack Other    Colon polyps Neg Hx    Colon cancer Neg Hx    Esophageal cancer Neg Hx    Rectal cancer Neg Hx    Stomach cancer Neg Hx     Social History   Socioeconomic History   Marital status: Married    Spouse name: Not on file   Number of children: Not on file   Years of education: Not on file   Highest education level: Not on file  Occupational History   Not on file  Tobacco Use   Smoking status: Former    Types: Cigarettes    Quit date: 02/20/1999    Years since quitting: 22.0   Smokeless tobacco: Never  Vaping Use   Vaping Use: Never used  Substance and Sexual  Activity   Alcohol use: Yes    Alcohol/week: 4.0 standard drinks    Types: 4 Standard drinks or equivalent per week   Drug use: Not Currently   Sexual activity: Not on file  Other Topics Concern   Not on file  Social History Narrative   Not on file   Social Determinants of Health   Financial Resource Strain: Low Risk    Difficulty of Paying Living Expenses: Not hard at all  Food Insecurity: No Food Insecurity   Worried About Charity fundraiser in the Last Year: Never true   [place] in the Last Year: Never true  Transportation Needs: No Transportation Needs   Lack of Transportation (Medical): No   Lack of Transportation (Non-Medical): No  Physical Activity: Insufficiently Active   Days of Exercise per Week: 2 days   Minutes of Exercise per Session: 60 min  Stress: No Stress Concern Present   Feeling of Stress : Not at all  Social Connections: Unknown   Frequency of Communication with Friends and  Family: Patient refused   Frequency of Social Gatherings with Friends and Family: Patient refused   Attends Religious Services: More than 4 times per year   Active Member of [employer] or Organizations: Yes   Attends Music therapist: More than 4 times per year   Marital Status: Married  Human resources officer Violence: Not At Risk   Fear of Current or Ex-Partner: No   Emotionally Abused: No   Physically Abused: No   Sexually Abused: No    Outpatient Medications Prior to Visit  Medication Sig Dispense Refill   Accu-Chek Softclix Lancets lancets Use as instructed to check blood sugar 4 times daily 400 each 3   atorvastatin (LIPITOR) 20 MG tablet TAKE 1 TABLET BY MOUTH EVERY DAY 90 tablet 0   Blood Glucose Monitoring Suppl (ACCU-CHEK GUIDE ME) w/Device KIT Use as instructed to check blood sugar 4 times daily 1 kit 0   furosemide (LASIX) 40 MG tablet TAKE 1 TABLET BY MOUTH TWICE A DAY 180 tablet 1   glucose blood (ACCU-CHEK GUIDE) test strip Use as instructed to check blood sugar 4 times daily 400 each 3   insulin aspart (NOVOLOG FLEXPEN) 100 UNIT/ML FlexPen Inject up to 90 units daily as instructed. 60 mL 1   Insulin Glargine (BASAGLAR KWIKPEN) 100 UNIT/ML Inject 80 Units into the skin daily. 75 mL 0   Insulin Pen Needle (B-D UF III MINI PEN NEEDLES) 31G X 5 MM MISC USE AS DIRECTED - 5X A DAY. 100 each 17   lisinopril (ZESTRIL) 10 MG tablet TAKE 1 TABLET BY MOUTH EVERY DAY 90 tablet 1   metoprolol succinate (TOPROL-XL) 25 MG 24 hr tablet TAKE 1 TABLET BY MOUTH EVERY DAY 90 tablet 0   Semaglutide, 1 MG/DOSE, (OZEMPIC, 1 MG/DOSE,) 4 MG/3ML SOPN Inject 1 mg into the skin once a week. 9 mL 3   No facility-administered medications prior to visit.    No Known Allergies  ROS Review of Systems  Constitutional:  Positive for fatigue. Negative for chills and fever.  HENT:  Positive for sinus pressure and sinus pain.   Respiratory:  Positive for cough. Negative for shortness of breath and  wheezing.      Objective:    Physical Exam Vitals reviewed.  Constitutional:      General: She is not in acute distress.    Appearance: Normal appearance. She is not ill-appearing or toxic-appearing.  Cardiovascular:  Rate and Rhythm: Normal rate and regular rhythm.  Pulmonary:     Effort: Pulmonary effort is normal.     Breath sounds: Normal breath sounds. No wheezing or rales.  Musculoskeletal:     Cervical back: Neck supple.  Neurological:     Mental Status: She is alert.    BP (!) 150/74 (BP Location: Left Arm, Patient Position: Sitting, Cuff Size: Large)    Pulse 78    Temp 98.5 F (36.9 C) (Oral)    Ht 5' 10"  (1.778 m)    Wt (!) 309 lb (140.2 kg)    SpO2 96%    BMI 44.34 kg/m  Wt Readings from Last 3 Encounters:  02/22/21 (!) 309 lb (140.2 kg)  01/20/21 (!) 306 lb 6.4 oz (139 kg)  01/06/21 (!) 306 lb 14.4 oz (139.2 kg)     Health Maintenance Due  Topic Date Due   OPHTHALMOLOGY EXAM  03/16/2020   COVID-19 Vaccine (4 - Booster for Pfizer series) 04/23/2020   DEXA SCAN  Never done   Pneumonia Vaccine 43+ Years old (2 - PCV) 12/21/2020    There are no preventive care reminders to display for this patient.  No results found for: TSH Lab Results  Component Value Date   WBC 7.9 01/10/2021   HGB 11.3 (L) 01/10/2021   HCT 35.4 (L) 01/10/2021   MCV 85.1 01/10/2021   PLT 250.0 01/10/2021   Lab Results  Component Value Date   NA 139 01/10/2021   K 4.2 01/10/2021   CO2 29 01/10/2021   GLUCOSE 89 01/10/2021   BUN 18 01/10/2021   CREATININE 1.65 (H) 01/10/2021   BILITOT 0.3 01/10/2021   ALKPHOS 104 01/10/2021   AST 18 01/10/2021   ALT 22 01/10/2021   PROT 7.3 01/10/2021   ALBUMIN 3.9 01/10/2021   CALCIUM 9.1 01/10/2021   GFR 32.50 (L) 01/10/2021   Lab Results  Component Value Date   CHOL 128 01/10/2021   Lab Results  Component Value Date   HDL 49.50 01/10/2021   Lab Results  Component Value Date   LDLCALC 62 01/10/2021   Lab Results  Component  Value Date   TRIG 83.0 01/10/2021   Lab Results  Component Value Date   CHOLHDL 3 01/10/2021   Lab Results  Component Value Date   HGBA1C 8.6 (A) 01/06/2021      Assessment & Plan:   Acute sinusitis symptoms.  Sounds like this may have started as typical viral URI but patient has not improving and this is going on over 10 days.  We elected to go ahead and cover with Augmentin 875 mg twice daily.  Stay well-hydrated.  She was concerned about risk for yeast vaginitis as she is going out of town early next week.  We did send in 1 fluconazole 150 mg tablet to take if needed.  Follow-up with primary if not improving with the above  Meds ordered this encounter  Medications   amoxicillin-clavulanate (AUGMENTIN) 875-125 MG tablet    Sig: Take 1 tablet by mouth 2 (two) times daily.    Dispense:  20 tablet    Refill:  0   fluconazole (DIFLUCAN) 150 MG tablet    Sig: Take 1 tablet (150 mg total) by mouth once for 1 dose.    Dispense:  1 tablet    Refill:  0    Follow-up: No follow-ups on file.    Carolann Littler, MD

## 2021-03-09 ENCOUNTER — Ambulatory Visit
Admission: RE | Admit: 2021-03-09 | Discharge: 2021-03-09 | Disposition: A | Discharge status: Home / Self Care | Payer: Medicare Other | Source: Ambulatory Visit | Attending: Nurse Practitioner | Admitting: Nurse Practitioner

## 2021-03-09 ENCOUNTER — Other Ambulatory Visit: Payer: Self-pay

## 2021-03-09 DIAGNOSIS — R928 Other abnormal and inconclusive findings on diagnostic imaging of breast: Secondary | ICD-10-CM

## 2021-03-25 ENCOUNTER — Other Ambulatory Visit: Payer: Self-pay | Admitting: Internal Medicine

## 2021-03-25 DIAGNOSIS — E1159 Type 2 diabetes mellitus with other circulatory complications: Secondary | ICD-10-CM

## 2021-03-25 DIAGNOSIS — I152 Hypertension secondary to endocrine disorders: Secondary | ICD-10-CM

## 2021-05-01 ENCOUNTER — Ambulatory Visit: Payer: Medicare Other | Admitting: Internal Medicine

## 2021-05-05 ENCOUNTER — Other Ambulatory Visit: Payer: Self-pay

## 2021-05-05 ENCOUNTER — Encounter: Payer: Self-pay | Admitting: Internal Medicine

## 2021-05-05 ENCOUNTER — Ambulatory Visit (INDEPENDENT_AMBULATORY_CARE_PROVIDER_SITE_OTHER): Payer: Medicare Other | Admitting: Internal Medicine

## 2021-05-05 VITALS — BP 120/72 | HR 69 | Ht 70.0 in | Wt 302.4 lb

## 2021-05-05 DIAGNOSIS — E785 Hyperlipidemia, unspecified: Secondary | ICD-10-CM

## 2021-05-05 DIAGNOSIS — E1122 Type 2 diabetes mellitus with diabetic chronic kidney disease: Secondary | ICD-10-CM

## 2021-05-05 DIAGNOSIS — E1169 Type 2 diabetes mellitus with other specified complication: Secondary | ICD-10-CM

## 2021-05-05 DIAGNOSIS — Z794 Long term (current) use of insulin: Secondary | ICD-10-CM | POA: Diagnosis not present

## 2021-05-05 DIAGNOSIS — N183 Chronic kidney disease, stage 3 unspecified: Secondary | ICD-10-CM

## 2021-05-05 LAB — POCT GLYCOSYLATED HEMOGLOBIN (HGB A1C): Hemoglobin A1C: 7.6 % — AB (ref 4.0–5.6)

## 2021-05-05 NOTE — Progress Notes (Signed)
Subjective:  ?  ? Patient ID: Savannah Crawford, female   DOB: 09/02/1955, 65 y.o.   MRN: [id number] ? ?This visit occurred during the SARS-CoV-2 public health emergency.  Safety protocols were in place, including screening questions prior to the visit, additional usage of staff PPE, and extensive cleaning of exam room while observing appropriate contact time as indicated for disinfecting solutions.  ? ?HPI ?Savannah Crawford is a 65 y.o. woman returning for f/u for DM2, insulin-dependent, uncontrolled, with complications (CAD, CKD, diabetes neuropathy, diabetic retinopathy, PVD, amputated toe-R). Last visit 3 months ago. ? ?Interim history: ?No increased urination, blurry vision, nausea, chest pain. ?She does complain of constipation.  She may go 3 days between bowel movements. ? ?Reviewed HbA1c levels: ?Lab Results  ?Component Value Date  ? HGBA1C 8.6 (A) 01/06/2021  ? HGBA1C 7.8 (A) 07/26/2020  ? HGBA1C 9.3 (A) 04/13/2020  ? ?She is on: ?- Basaglar 40 units in a.m. and 30-40 units at night  ?- Novolog dose: (15-25) - (15-20) - (15-20) units before meals ?- Novolog Sliding scale: ?- 150- 165: + 1 unit  ?- 166- 180: + 2 units  ?- 181- 195: + 3 units  ?- 196- 210: + 4 units  ?- >210: + 5 units  ?Take NovoLog 15 minutes before every meal. ?- Ozempic 0.5 mg weekly- started 03/2020  >> did not increase to 1 mg as advised at last OV ?I again suggested Ozempic 0.5 mg weekly in the past, also: 11/2018 >> did not start as she did not want to add another med... In 03/2019, she did try to start it but this was expensive. ?She was on metformin in the past, but stopped 2/2 CKD.  ?Tried Victoza before >> cannot remember SE. ? ?Checks sugars 0-1x a day: ?- am: 106-142, 155, 180 >> 85-177 >> 110-147 >> 98-126, 137 ?- after b'fast: 128-183 >> n/c ?- prelunch:185 >> 170-180 >> n/c  ?- after lunch: 236 >> n/c ?- predinner: 230-240 >> 144, 153 >> n/c >> 140s, 178 ?- after dinner: 77 >> 119-195 >> 170-212 >> 174 ?- bedtime: n/c >> 135-217,  366 >> n/c  ?Lowest: 68 >> ... 85 >> 110 >> 100 ?Highest sugar 220 >> 180 >> 195 >> 212 >> 178 ? ?Meter: Freestyle Lite ? ?+ CKD-sees nephrology: ?Lab Results  ?Component Value Date  ? BUN 18 01/10/2021  ? CREATININE 1.65 (H) 01/10/2021  ?On lisinopril 10. ? ?+ HL; Latest lipids: ?Lab Results  ?Component Value Date  ? CHOL 128 01/10/2021  ? HDL 49.50 01/10/2021  ? LDLCALC 62 01/10/2021  ? TRIG 83.0 01/10/2021  ? CHOLHDL 3 01/10/2021  ?On Lipitor 20. ? ?Last eye exam 06/2020: + DR, + cataract reportedly.  ? ?+ Stable numbness and tingling in her feet.  She sees podiatry (Dr. Jah).  She has a history of left third toe ulcer. Last podiatry visit 03/2021.  She has another appointment in 2 months.She has a history of skin graft fourth toe ulcer. ? ?She has a history of nonobstructive CAD, with positive stress test but a negative cardiac cath 2012, and also has diastolic heart failure. She has HTN, venous stasis, vitamin D deficiency. ? ?She denies family history of medullary thyroid cancer and no personal history of pancreatitis. ? ?Review of Systems ?+ see HPI ?Neurological: no tremors/+ numbness/+ tingling ? ?I reviewed pt's medications, allergies, PMH, social hx, family hx, and changes were documented in the history of present illness. Otherwise, unchanged from my initial visit   note. ? ?Past Medical History:  ?Diagnosis Date  ? Anemia of chronic disease 11/17/2012  ? not on meds at this time  ? At risk for heart disease - +stres test 2012 followed by [facility] [place] with neg cardiac cath 03/2010 (non-obstructive CAD) per review of records, echo 02/2010 normal LVF 01/15/2012  ? Chronic kidney disease (CKD), stage IV (severe) (HCC) 01/09/2012  ? Diabetes mellitus with renal manifestations, uncontrolled   ? on meds  ? Diabetic neuropathy (HCC) 01/09/2012  ? Diastolic heart failure - stage 1 per review of echo results from 02/2010 01/15/2012  ? GERD (gastroesophageal reflux disease)   ? OTC meds for tx  ?  Hyperlipidemia   ? on meds  ? Hypertension   ? on meds  ? Obesity   ? Peripheral arterial disease? Eval by vascular 2013 with no LE PAD and advised to stop pletal   ? Severe obesity (BMI >= 40) (HCC) 11/13/2012  ? Toe ulcer, right (HCC)   ? Ulcer of toe of left foot (HCC)   ? treated by [facility] per patient  ? Venous stasis of lower extremity 03/10/2012  ? ?Past Surgical History:  ?Procedure Laterality Date  ? COLONOSCOPY  2013/2014  ? [place]- records not available   ? REFRACTIVE SURGERY Bilateral   ? TOE AMPUTATION Right 2008  ? RIGHT   ? ?Social History  ? ?Socioeconomic History  ? Marital status: Married  ?  Spouse name: Not on file  ? Number of children: Not on file  ? Years of education: Not on file  ? Highest education level: Not on file  ?Occupational History  ? Not on file  ?Tobacco Use  ? Smoking status: Former  ?  Types: Cigarettes  ?  Quit date: 02/20/1999  ?  Years since quitting: 22.2  ? Smokeless tobacco: Never  ?Vaping Use  ? Vaping Use: Never used  ?Substance and Sexual Activity  ? Alcohol use: Yes  ?  Alcohol/week: 4.0 standard drinks  ?  Types: 4 Standard drinks or equivalent per week  ? Drug use: Not Currently  ? Sexual activity: Not on file  ?Other Topics Concern  ? Not on file  ?Social History Narrative  ? Not on file  ? ?Social Determinants of Health  ? ?Financial Resource Strain: Low Risk   ? Difficulty of Paying Living Expenses: Not hard at all  ?Food Insecurity: No Food Insecurity  ? Worried About Running Out of Food in the Last Year: Never true  ? Ran Out of Food in the Last Year: Never true  ?Transportation Needs: No Transportation Needs  ? Lack of Transportation (Medical): No  ? Lack of Transportation (Non-Medical): No  ?Physical Activity: Insufficiently Active  ? Days of Exercise per Week: 2 days  ? Minutes of Exercise per Session: 60 min  ?Stress: No Stress Concern Present  ? Feeling of Stress : Not at all  ?Social Connections: Unknown  ? Frequency of Communication with Friends and Family:  Patient refused  ? Frequency of Social Gatherings with Friends and Family: Patient refused  ? Attends Religious Services: More than 4 times per year  ? Active Member of Clubs or Organizations: Yes  ? Attends Club or Organization Meetings: More than 4 times per year  ? Marital Status: Married  ?Intimate Partner Violence: Not At Risk  ? Fear of Current or Ex-Partner: No  ? Emotionally Abused: No  ? Physically Abused: No  ? Sexually Abused: No  ? ?  Current Outpatient Medications on File Prior to Visit  ?Medication Sig Dispense Refill  ? Accu-Chek Softclix Lancets lancets Use as instructed to check blood sugar 4 times daily 400 each 3  ? amoxicillin-clavulanate (AUGMENTIN) 875-125 MG tablet Take 1 tablet by mouth 2 (two) times daily. 20 tablet 0  ? atorvastatin (LIPITOR) 20 MG tablet TAKE 1 TABLET BY MOUTH EVERY DAY 90 tablet 0  ? Blood Glucose Monitoring Suppl (ACCU-CHEK GUIDE ME) w/Device KIT Use as instructed to check blood sugar 4 times daily 1 kit 0  ? furosemide (LASIX) 40 MG tablet TAKE 1 TABLET BY MOUTH TWICE A DAY 180 tablet 1  ? glucose blood (ACCU-CHEK GUIDE) test strip Use as instructed to check blood sugar 4 times daily 400 each 3  ? insulin aspart (NOVOLOG FLEXPEN) 100 UNIT/ML FlexPen Inject up to 90 units daily as instructed. 60 mL 1  ? Insulin Glargine (BASAGLAR KWIKPEN) 100 UNIT/ML Inject 80 Units into the skin daily. 75 mL 0  ? Insulin Pen Needle (B-D UF III MINI PEN NEEDLES) 31G X 5 MM MISC USE AS DIRECTED - 5X A DAY. 100 each 17  ? lisinopril (ZESTRIL) 10 MG tablet TAKE 1 TABLET BY MOUTH EVERY DAY 90 tablet 1  ? metoprolol succinate (TOPROL-XL) 25 MG 24 hr tablet TAKE 1 TABLET BY MOUTH EVERY DAY 90 tablet 1  ? Semaglutide, 1 MG/DOSE, (OZEMPIC, 1 MG/DOSE,) 4 MG/3ML SOPN Inject 1 mg into the skin once a week. 9 mL 3  ? ?No current facility-administered medications on file prior to visit.  ? ?No Known Allergies ?Family History  ?Problem Relation Age of Onset  ? Diabetes Mother   ? Hyperlipidemia Mother    ? Hypertension Mother   ? Heart attack Mother   ? Stroke Mother   ? Diabetes Sister   ? Breast cancer Sister 31  ? Hypertension Brother   ? Diabetes Sister   ? Diabetes Sister   ? Obesity Other   ? Hypertens

## 2021-05-05 NOTE — Patient Instructions (Addendum)
Please continue: ?- Basaglar 40 units in a.m. and 30-40 units at night ?- Novolog dose: (15-25) - (15-20) - (15-20) units before meals ?- Novolog Sliding scale: 15 minutes before every meal ?- Ozempic 0.5 mg weekly ? ?Please return in 3-4 months with your sugar log.  ?

## 2021-05-16 ENCOUNTER — Other Ambulatory Visit: Payer: Self-pay

## 2021-05-16 ENCOUNTER — Other Ambulatory Visit: Payer: Self-pay | Admitting: Internal Medicine

## 2021-05-16 DIAGNOSIS — E1169 Type 2 diabetes mellitus with other specified complication: Secondary | ICD-10-CM

## 2021-05-16 DIAGNOSIS — E1159 Type 2 diabetes mellitus with other circulatory complications: Secondary | ICD-10-CM

## 2021-05-16 DIAGNOSIS — E1165 Type 2 diabetes mellitus with hyperglycemia: Secondary | ICD-10-CM

## 2021-05-16 MED ORDER — OZEMPIC (1 MG/DOSE) 4 MG/3ML ~~LOC~~ SOPN: 1.0000 mg | PEN_INJECTOR | SUBCUTANEOUS | 1 refills | Status: DC

## 2021-07-06 ENCOUNTER — Ambulatory Visit: Payer: Medicare Other | Admitting: Internal Medicine

## 2021-07-24 ENCOUNTER — Other Ambulatory Visit: Payer: Self-pay | Admitting: Internal Medicine

## 2021-07-24 DIAGNOSIS — E1159 Type 2 diabetes mellitus with other circulatory complications: Secondary | ICD-10-CM

## 2021-07-24 DIAGNOSIS — I5032 Chronic diastolic (congestive) heart failure: Secondary | ICD-10-CM

## 2021-07-26 ENCOUNTER — Other Ambulatory Visit: Payer: Self-pay | Admitting: Internal Medicine

## 2021-07-26 DIAGNOSIS — I5032 Chronic diastolic (congestive) heart failure: Secondary | ICD-10-CM

## 2021-07-31 ENCOUNTER — Other Ambulatory Visit: Payer: Self-pay | Admitting: Internal Medicine

## 2021-07-31 DIAGNOSIS — I5032 Chronic diastolic (congestive) heart failure: Secondary | ICD-10-CM

## 2021-08-16 ENCOUNTER — Ambulatory Visit: Payer: Medicare Other | Admitting: Internal Medicine

## 2021-08-17 ENCOUNTER — Other Ambulatory Visit: Payer: Self-pay | Admitting: Internal Medicine

## 2021-08-17 DIAGNOSIS — Z794 Long term (current) use of insulin: Secondary | ICD-10-CM

## 2021-08-29 ENCOUNTER — Ambulatory Visit (INDEPENDENT_AMBULATORY_CARE_PROVIDER_SITE_OTHER): Payer: Medicare Other | Admitting: Internal Medicine

## 2021-08-29 ENCOUNTER — Encounter: Payer: Self-pay | Admitting: Internal Medicine

## 2021-08-29 VITALS — BP 110/70 | HR 62 | Temp 97.6°F | Wt 299.4 lb

## 2021-08-29 DIAGNOSIS — N184 Chronic kidney disease, stage 4 (severe): Secondary | ICD-10-CM

## 2021-08-29 DIAGNOSIS — I5033 Acute on chronic diastolic (congestive) heart failure: Secondary | ICD-10-CM | POA: Diagnosis not present

## 2021-08-29 DIAGNOSIS — E1140 Type 2 diabetes mellitus with diabetic neuropathy, unspecified: Secondary | ICD-10-CM

## 2021-08-29 DIAGNOSIS — Z794 Long term (current) use of insulin: Secondary | ICD-10-CM

## 2021-08-29 DIAGNOSIS — E785 Hyperlipidemia, unspecified: Secondary | ICD-10-CM | POA: Diagnosis not present

## 2021-08-29 DIAGNOSIS — I878 Other specified disorders of veins: Secondary | ICD-10-CM

## 2021-08-29 DIAGNOSIS — E1169 Type 2 diabetes mellitus with other specified complication: Secondary | ICD-10-CM

## 2021-08-29 NOTE — Progress Notes (Signed)
Established Patient Office Visit     CC/Reason for Visit: 72-monthfollow-up chronic medical conditions  HPI: Savannah ALTOis a 66y.o. female who is coming in today for the above mentioned reasons. Past Medical History is significant for: insulin-dependent diabetes followed by Dr. GCruzita Lederer chronic kidney disease stage IV with a baseline creatinine around 1.72 [facility]lipidemia well controlled on Lipitor, morbid obesity with a BMI of about 44, well-controlled hypertension, peripheral arterial disease status post right second toe amputation, heart failure with preserved ejection fraction.  She has been having tingling and numbness around her anterior thighs.  She is requesting a neurology referral for "nerve conduction studies".   Past Medical/Surgical History: Past Medical History:  Diagnosis Date   Anemia of chronic disease 11/17/2012   not on meds at this time   At risk for heart disease - +[facility] [place] [facility] [place] [place] [facility] [place]non-obstructive CAD) per review of records, echo 02/2010 normal LVF 01/15/2012   Chronic kidney disease (CKD), stage IV (severe) ([place] 01/09/2012   Diabetes mellitus with renal manifestations, uncontrolled    on meds   Diabetic neuropathy ([place] 118/84/1660  Diastolic heart failure - stage 1 per review of echo results from 02/2010 01/15/2012   GERD (gastroesophageal reflux disease)    OTC meds for tx   Hyperlipidemia    on meds   Hypertension    on meds   Obesity    Peripheral arterial disease? Eval by vascular 2013 with no LE PAD and advised to stop pletal    Severe obesity (BMI >= 40) ([place] 11/13/2012   Toe ulcer, right (HCC)    [place] foot [place]    treated by [facility] per patient   Venous stasis of lower extremity 03/10/2012    Past Surgical History:  Procedure Laterality Date   COLONOSCOPY  2013/2014   [place]     REFRACTIVE SURGERY Bilateral    TOE AMPUTATION Right 2008   RIGHT     Social History:  reports that she quit smoking about 22 years ago. Her smoking use included cigarettes. She has never used smokeless tobacco. She reports current alcohol use of about 4.0 standard drinks of alcohol per week. She reports that she does not currently use drugs.  Allergies: No Known Allergies  Family History:  Family History  Problem Relation Age of Onset   Diabetes Mother    Hyperlipidemia Mother    Hypertension Mother    Heart attack Mother    Stroke Mother    Diabetes Sister    Breast cancer Sister 554  Hypertension Brother    Diabetes Sister    Diabetes Sister    Obesity Other    Hypertension Other    Hyperlipidemia Other    Stroke Other    Heart attack Other    Colon polyps Neg Hx    Colon cancer Neg Hx    Esophageal cancer Neg Hx    Rectal cancer Neg Hx    Stomach cancer Neg Hx      Current Outpatient Medications:    Accu-Chek Softclix Lancets lancets, Use as instructed to check blood sugar 4 times daily, Disp: 400 each, Rfl: 3   atorvastatin (LIPITOR) 20 MG tablet, TAKE 1 TABLET BY MOUTH EVERY DAY, Disp: 90 tablet, Rfl: 1   Blood Glucose Monitoring Suppl (ACCU-CHEK GUIDE ME) w/Device KIT, Use as  instructed to check blood sugar 4 times daily, Disp: 1 kit, Rfl: 0   furosemide (LASIX) 40 MG tablet, TAKE 1 TABLET BY MOUTH TWICE A DAY, Disp: 180 tablet, Rfl: 1   glucose blood (ACCU-CHEK GUIDE) test strip, Use as instructed to check blood sugar 4 times daily, Disp: 400 each, Rfl: 3   Insulin Glargine (BASAGLAR KWIKPEN) 100 UNIT/ML, Inject 80 Units into the skin daily., Disp: 75 mL, Rfl: 0   Insulin Pen Needle (B-D UF III MINI PEN NEEDLES) 31G X 5 MM MISC, USE AS DIRECTED - 5X A DAY., Disp: 100 each, Rfl: 17   lisinopril (ZESTRIL) 10 MG tablet, TAKE 1 TABLET BY MOUTH EVERY DAY, Disp: 90 tablet, Rfl: 1   metoprolol succinate (TOPROL-XL) 25 MG 24 hr tablet, TAKE 1 TABLET BY MOUTH EVERY DAY,  Disp: 90 tablet, Rfl: 1   NOVOLOG FLEXPEN 100 UNIT/ML FlexPen, Inject up to 90 units daily as instructed., Disp: 60 mL, Rfl: 1   Semaglutide, 1 MG/DOSE, (OZEMPIC, 1 MG/DOSE,) 4 MG/3ML SOPN, Inject 1 mg into the skin once a week., Disp: 9 mL, Rfl: 1  Review of Systems:  Constitutional: Denies fever, chills, diaphoresis, appetite change and fatigue.  HEENT: Denies photophobia, eye pain, redness, hearing loss, ear pain, congestion, sore throat, rhinorrhea, sneezing, mouth sores, trouble swallowing, neck pain, neck stiffness and tinnitus.   Respiratory: Denies SOB, DOE, cough, chest tightness,  and wheezing.   Cardiovascular: Denies chest pain, palpitations and leg swelling.  Gastrointestinal: Denies nausea, vomiting, abdominal pain, diarrhea, constipation, blood in stool and abdominal distention.  Genitourinary: Denies dysuria, urgency, frequency, hematuria, flank pain and difficulty urinating.  Endocrine: Denies: hot or cold intolerance, sweats, changes in hair or nails, polyuria, polydipsia. Musculoskeletal: Denies myalgias, back pain, joint swelling, arthralgias and gait problem.  Skin: Denies pallor, rash and wound.  Neurological: Denies dizziness, seizures, syncope, weakness, light-headedness and headaches.  Hematological: Denies adenopathy. Easy bruising, personal or family bleeding history  Psychiatric/Behavioral: Denies suicidal ideation, mood changes, confusion, nervousness, sleep disturbance and agitation    Physical Exam: Vitals:   08/29/21 1455  BP: 110/70  Pulse: 62  Temp: 97.6 F (36.4 C)  TempSrc: Oral  SpO2: 96%  Weight: 299 lb 6.4 oz (135.8 kg)    Body mass index is 42.96 kg/m.   Constitutional: NAD, calm, comfortable Eyes: PERRL, lids and conjunctivae normal ENMT: Mucous membranes are moist.  Respiratory: clear to auscultation bilaterally, no wheezing, no crackles. Normal respiratory effort. No accessory muscle use.  Cardiovascular: Regular rate and rhythm, no  murmurs / rubs / gallops.  Trace bilateral lower extremity edema.  Psychiatric: Normal judgment and insight. Alert and oriented x 3. Normal mood.    Impression and Plan:  Hyperlipidemia associated with type 2 diabetes mellitus ([place])  Type 2 diabetes mellitus with other specified complication, without long-term current use of insulin ([place]) - Plan: Microalbumin / creatinine urine ratio  Morbid obesity (HCC)  Acute on chronic diastolic heart failure (HCC)  Venous stasis of lower extremity  Chronic kidney disease (CKD), stage IV (severe) ([place])  -She is requesting referral to neurology, I will place, although I am not sure that nerve conduction studies are entirely necessary. -Her A1c has improved significantly, was 7.6 when last checked in March, check microalbumin today. -She has her eye exam scheduled for next week. -She is following routinely with her nephrologist.  Time spent:33 minutes reviewing chart, interviewing and examining patient and formulating plan of care.    Lelon Frohlich, MD [facility]   

## 2021-08-30 LAB — MICROALBUMIN / CREATININE URINE RATIO
Creatinine,U: 120.8 mg/dL
Microalb Creat Ratio: 0.6 mg/g (ref 0.0–30.0)
Microalb, Ur: <0.7 mg/dL (ref 0.0–1.9)

## 2021-09-05 ENCOUNTER — Encounter: Payer: Self-pay | Admitting: Neurology

## 2021-09-14 ENCOUNTER — Ambulatory Visit (INDEPENDENT_AMBULATORY_CARE_PROVIDER_SITE_OTHER): Payer: Medicare Other | Admitting: Internal Medicine

## 2021-09-14 ENCOUNTER — Encounter: Payer: Self-pay | Admitting: Internal Medicine

## 2021-09-14 VITALS — BP 116/70 | HR 68 | Ht 70.0 in | Wt 299.8 lb

## 2021-09-14 DIAGNOSIS — E1122 Type 2 diabetes mellitus with diabetic chronic kidney disease: Secondary | ICD-10-CM | POA: Diagnosis not present

## 2021-09-14 DIAGNOSIS — Z794 Long term (current) use of insulin: Secondary | ICD-10-CM | POA: Diagnosis not present

## 2021-09-14 DIAGNOSIS — N183 Chronic kidney disease, stage 3 unspecified: Secondary | ICD-10-CM | POA: Diagnosis not present

## 2021-09-14 DIAGNOSIS — E1159 Type 2 diabetes mellitus with other circulatory complications: Secondary | ICD-10-CM | POA: Diagnosis not present

## 2021-09-14 DIAGNOSIS — E1169 Type 2 diabetes mellitus with other specified complication: Secondary | ICD-10-CM | POA: Diagnosis not present

## 2021-09-14 DIAGNOSIS — E1165 Type 2 diabetes mellitus with hyperglycemia: Secondary | ICD-10-CM

## 2021-09-14 DIAGNOSIS — E785 Hyperlipidemia, unspecified: Secondary | ICD-10-CM

## 2021-09-14 LAB — HM DIABETES EYE EXAM

## 2021-09-14 MED ORDER — OZEMPIC (1 MG/DOSE) 4 MG/3ML ~~LOC~~ SOPN: 1.0000 mg | PEN_INJECTOR | SUBCUTANEOUS | 3 refills | Status: DC

## 2021-09-14 MED ORDER — FREESTYLE LIBRE 2 SENSOR MISC: 1.0000 | 3 refills | Status: DC

## 2021-09-14 MED ORDER — FREESTYLE LIBRE 2 READER DEVI: 1.0000 | Freq: Every day | 0 refills | Status: DC

## 2021-09-14 MED ORDER — FREESTYLE LIBRE 3 SENSOR MISC: 1.0000 | 3 refills | Status: DC

## 2021-09-14 MED ORDER — BASAGLAR KWIKPEN 100 UNIT/ML ~~LOC~~ SOPN: 80.0000 [IU] | PEN_INJECTOR | Freq: Every day | SUBCUTANEOUS | Status: DC

## 2021-09-14 MED ORDER — NOVOLOG FLEXPEN 100 UNIT/ML ~~LOC~~ SOPN: PEN_INJECTOR | SUBCUTANEOUS | 3 refills | Status: DC

## 2021-09-14 NOTE — Patient Instructions (Addendum)
Please take consistently: - Basaglar 40 units in a.m. and 30-40 units at night - Novolog dose: (15-25) - (15-20) - (15-20) units before meals - Novolog Sliding scale: 15 minutes before every meal - Ozempic 1 mg weekly  You may need to set alarms on your phone for the injections.  Please return in 3-4 months with your sugar log.

## 2021-09-14 NOTE — Progress Notes (Signed)
Subjective:     Patient ID: Savannah Crawford, female   DOB: [birth year], 66 y.o.   MRN: [id number]  HPI Savannah Crawford is a 66 y.o. woman returning for f/u for DM2, insulin-dependent, uncontrolled, with complications (CAD, CKD, diabetes neuropathy, diabetic retinopathy, PVD, amputated toe-R). Last visit 4 months ago.  Interim history: No increased urination, blurry vision, nausea, chest pain.  She continues to have leg swelling, which is chronic for her.  Reviewed HbA1c levels: Lab Results  Component Value Date   HGBA1C 7.6 (A) 05/05/2021   HGBA1C 8.6 (A) 01/06/2021   HGBA1C 7.8 (A) 07/26/2020   She is on: - Basaglar 40 units in a.m. and 30-40 units at night - missing doses - Novolog dose: (15-25) - (15-20) - (15-20) units before meals - missing doses - Novolog Sliding scale: - 150- 165: + 1 unit  - 166- 180: + 2 units  - 181- 195: + 3 units  - 196- 210: + 4 units  - >210: + 5 units  Take NovoLog 15 minutes before every meal. - Ozempic 0.5 mg weekly- started 03/2020 >> 1 mg weekly >> missed 1 dose I again suggested Ozempic 0.5 mg weekly in the past, also: 11/2018 >> did not start as she did not want to add another med... In 03/2019, she did try to start it but this was expensive. She was on metformin in the past, but stopped 2/2 CKD.  Tried Victoza before >> cannot remember SE.  Checks sugars 0-1x a day: - am: 85-177 >> 110-147 >> 98-126, 137 >> 78-142 - after b'fast: 128-183 >> n/c - prelunch:185 >> 170-180 >> n/c  - after lunch: 236 >> n/c - predinner: 230-240 >> 144, 153 >> n/c >> 140s, 178 >> 148, 290 - after dinner: 77 >> 119-195 >> 170-212 >> 174 >> n/c - bedtime: n/c >> 135-217, 366 >> n/c  Lowest: 68 >> .Marland Kitchen.  100 >> 78 Highest sugar 212 >> 178 >> 290 (missed both injection)  Meter: Freestyle Lite  + CKD-sees nephrology: Lab Results  Component Value Date   BUN 18 01/10/2021   CREATININE 1.65 (H) 01/10/2021  On lisinopril 10.  + HL; Latest lipids: Lab Results   Component Value Date   CHOL 128 01/10/2021   HDL 49.50 01/10/2021   LDLCALC 62 01/10/2021   TRIG 83.0 01/10/2021   CHOLHDL 3 01/10/2021  On Lipitor 20.  Last eye exam 09/14/2021: + DR, + cataract reportedly.   + Stable numbness and tingling in her feet.  She sees podiatry (Dr. Melony Overly).  She has a history of left third toe ulcer. Last podiatry visit 03/2021.  She has a history of skin graft fourth toe ulcer.  She has a history of nonobstructive CAD, with positive stress test but a negative cardiac cath 2012, and also has diastolic heart failure. She has HTN, venous stasis, vitamin D deficiency.  She denies family history of medullary thyroid cancer and no personal history of pancreatitis.  Review of Systems + see HPI Neurological: no tremors/+ numbness/+ tingling  I reviewed pt's medications, allergies, PMH, social hx, family hx, and changes were documented in the history of present illness. Otherwise, unchanged from my initial visit note.  Past Medical History:  Diagnosis Date   Anemia of chronic disease 11/17/2012   not on meds at this time   At risk for heart disease - +stres test 2012 followed by [facility], [place] with neg cardiac cath 03/2010 (non-obstructive CAD) per review of records,  echo 02/2010 normal LVF 01/15/2012   Chronic kidney disease (CKD), stage IV (severe) ([place]) 01/09/2012   Diabetes mellitus with renal manifestations, uncontrolled    on meds   Diabetic neuropathy ([place]) 83/15/1761   Diastolic heart failure - stage 1 per review of echo results from 02/2010 01/15/2012   GERD (gastroesophageal reflux disease)    OTC meds for tx   Hyperlipidemia    on meds   Hypertension    on meds   Obesity    Peripheral arterial disease? Eval by vascular 2013 with no LE PAD and advised to stop pletal    Severe obesity (BMI >= 40) (HCC) 11/13/2012   Toe ulcer, right (HCC)    Ulcer of toe of left foot ([place])    treated by [facility] per patient   Venous stasis of lower  extremity 03/10/2012   Past Surgical History:  Procedure Laterality Date   COLONOSCOPY  2013/2014   [place]- records not available    REFRACTIVE SURGERY Bilateral    TOE AMPUTATION Right 2008   RIGHT    Social History   Socioeconomic History   Marital status: Married    Spouse name: Not on file   Number of children: Not on file   Years of education: Not on file   Highest education level: Not on file  Occupational History   Not on file  Tobacco Use   Smoking status: Former    Types: Cigarettes    Quit date: 02/20/1999    Years since quitting: 22.5   Smokeless tobacco: Never  Vaping Use   Vaping Use: Never used  Substance and Sexual Activity   Alcohol use: Yes    Alcohol/week: 4.0 standard drinks of alcohol    Types: 4 Standard drinks or equivalent per week   Drug use: Not Currently   Sexual activity: Not on file  Other Topics Concern   Not on file  Social History Narrative   Not on file   Social Determinants of Health   Financial Resource Strain: Not on file  Food Insecurity: No Food Insecurity (05/30/2020)   Hunger Vital Sign    Worried About Running Out of Food in the Last Year: Never true    Ran Out of Food in the Last Year: Never true  Transportation Needs: No Transportation Needs (05/30/2020)   PRAPARE - Hydrologist (Medical): No    Lack of Transportation (Non-Medical): No  Physical Activity: Insufficiently Active (05/30/2020)   Exercise Vital Sign    Days of Exercise per Week: 2 days    Minutes of Exercise per Session: 60 min  Stress: Not on file  Social Connections: Unknown (05/30/2020)   Social Connection and Isolation Panel [NHANES]    Frequency of Communication with Friends and Family: Patient refused    Frequency of Social Gatherings with Friends and Family: Patient refused    Attends Religious Services: More than 4 times per year    Active Member of [employer] or Organizations: Yes    Attends Music therapist: More than  4 times per year    Marital Status: Married  Human resources officer Violence: Not At Risk (05/30/2020)   Humiliation, Afraid, Rape, and Kick questionnaire    Fear of Current or Ex-Partner: No    Emotionally Abused: No    Physically Abused: No    Sexually Abused: No   Current Outpatient Medications on File Prior to Visit  Medication Sig Dispense Refill   Accu-Chek Softclix Lancets lancets  Use as instructed to check blood sugar 4 times daily 400 each 3   atorvastatin (LIPITOR) 20 MG tablet TAKE 1 TABLET BY MOUTH EVERY DAY 90 tablet 1   Blood Glucose Monitoring Suppl (ACCU-CHEK GUIDE ME) w/Device KIT Use as instructed to check blood sugar 4 times daily 1 kit 0   furosemide (LASIX) 40 MG tablet TAKE 1 TABLET BY MOUTH TWICE A DAY 180 tablet 1   glucose blood (ACCU-CHEK GUIDE) test strip Use as instructed to check blood sugar 4 times daily 400 each 3   Insulin Glargine (BASAGLAR KWIKPEN) 100 UNIT/ML Inject 80 Units into the skin daily. 75 mL 0   Insulin Pen Needle (B-D UF III MINI PEN NEEDLES) 31G X 5 MM MISC USE AS DIRECTED - 5X A DAY. 100 each 17   lisinopril (ZESTRIL) 10 MG tablet TAKE 1 TABLET BY MOUTH EVERY DAY 90 tablet 1   metoprolol succinate (TOPROL-XL) 25 MG 24 hr tablet TAKE 1 TABLET BY MOUTH EVERY DAY 90 tablet 1   NOVOLOG FLEXPEN 100 UNIT/ML FlexPen Inject up to 90 units daily as instructed. 60 mL 1   Semaglutide, 1 MG/DOSE, (OZEMPIC, 1 MG/DOSE,) 4 MG/3ML SOPN Inject 1 mg into the skin once a week. 9 mL 1   No current facility-administered medications on file prior to visit.   No Known Allergies Family History  Problem Relation Age of Onset   Diabetes Mother    Hyperlipidemia Mother    Hypertension Mother    Heart attack Mother    Stroke Mother    Diabetes Sister    Breast cancer Sister 28   Hypertension Brother    Diabetes Sister    Diabetes Sister    Obesity Other    Hypertension Other    Hyperlipidemia Other    Stroke Other    Heart attack Other    Colon polyps Neg Hx     Colon cancer Neg Hx    Esophageal cancer Neg Hx    Rectal cancer Neg Hx    Stomach cancer Neg Hx    Objective:   Physical Exam BP 116/70 (BP Location: Left Arm, Patient Position: Sitting, Cuff Size: Normal)   Pulse 68   Ht _0  (1.778 m)   Wt 299 lb 12.8 oz (136 kg)   SpO2 96%   BMI 43.02 kg/m   Wt Readings from Last 3 Encounters:  09/14/21 299 lb 12.8 oz (136 kg)  08/29/21 299 lb 6.4 oz (135.8 kg)  05/05/21 (!) 302 lb 6.4 oz (137.2 kg)   Constitutional: overweight, in NAD Eyes: EOMI, no exophthalmos ENT: moist mucous membranes, no thyromegaly, no cervical lymphadenopathy Cardiovascular: RRR, No MRG, + B LE edema Respiratory: CTA B Musculoskeletal: no deformities Skin: moist, warm, no rashes Neurological: no tremor with outstretched hands  Assessment:     1. DM2, insulin-dependent, uncontrolled, with complications - nonobstructive CAD - CKD - diabetes neuropathy - diabetic retinopathy - PVD - R amputated toe  2. HL  3. Obesity class 3    Plan:     1. Pt with history of uncontrolled type 2 diabetes, on basal bolus insulin regimen and also GLP-1 receptor agonist.  She initially had nausea with Ozempic but she tolerates well the 1 mg dose now but does have constipation.  At last visit I recommended MiraLAX, however, if not working, Microbiologist.  At last visit we also discussed about an SGLT2 inhibitor but she declines to try one.  She also refused to try CGM.  She had a freestyle libre CGM before but it fell down her arm causing pain, and she would not want to retry it even in another site. -At last visit, HbA1c was slightly better, at 7.6% -At today's visit, sugars are at or above target, with occasional hyperglycemic spikes.  She mentions that since last visit, she forgot many insulin doses, occasionally forgetting it several days in a row.  This can happen with both NovoLog and Basaglar.  We discussed that she absolutely needs to take Basaglar twice a day and  I advised her to put the pen on her nightstand.  I also recommended to set alarms on her phone to remind her to take the injections.  She will try this. -She mostly checks her blood sugars in the morning and less frequently later in the day.  I again suggested a freestyle libre CGM.  Discussed how this works and where she can attach them.  They were coming up for her in the past so I advised her to put them on the inside of her arm.  I am not sure whether her insurance covers the freestyle libre 2.  I sent both the 2 and 3 sensors to her pharmacy. -  I advised her to: Patient Instructions  Please take consistently: - Basaglar 40 units in a.m. and 30-40 units at night - Novolog dose: (15-25) - (15-20) - (15-20) units before meals - Novolog Sliding scale: 15 minutes before every meal - Ozempic 1 mg weekly  You may need to set alarms on your phone for the injections.  Please return in 3-4 months with your sugar log.   - we checked her HbA1c: 7.9% (higher) - advised to check sugars at different times of the day - 4x a day, rotating check times - advised for yearly eye exams >> she is UTD - she sees podiatry - return to clinic in 3-4 months  2. HL -Reviewed latest lipid panel from 12/2020: LDL slightly above target of less than 55, the rest of the fractions at goal: Lab Results  Component Value Date   CHOL 128 01/10/2021   HDL 49.50 01/10/2021   LDLCALC 62 01/10/2021   TRIG 83.0 01/10/2021   CHOLHDL 3 01/10/2021  -She continues on Lipitor 20 mg daily without side effects  3.  Obesity class III -She continues on Ozempic, which we are also using for weight loss.  She would not want to increase the dose -She lost 10 pounds before the last 2 visits combined - lost 3 more lbs since last OV  Philemon Kingdom, MD PhD [facility] Endocrinology

## 2021-09-15 ENCOUNTER — Ambulatory Visit: Payer: Medicare Other | Admitting: Internal Medicine

## 2021-09-21 ENCOUNTER — Other Ambulatory Visit (HOSPITAL_COMMUNITY): Payer: Self-pay

## 2021-09-21 ENCOUNTER — Telehealth: Payer: Self-pay

## 2021-09-21 DIAGNOSIS — E1122 Type 2 diabetes mellitus with diabetic chronic kidney disease: Secondary | ICD-10-CM

## 2021-09-21 MED ORDER — DEXCOM G7 RECEIVER DEVI: 0 refills | Status: DC

## 2021-09-21 MED ORDER — DEXCOM G7 SENSOR MISC: 3 refills | Status: DC

## 2021-09-21 NOTE — Telephone Encounter (Signed)
Dexcom rx sent to preferred pharmacy.

## 2021-09-21 NOTE — Telephone Encounter (Signed)
Patient Advocate Encounter   Received notification from [employer] that prior authorization is required for [facility] 2 reader, sensors.  This insurance plan prefers Dexcom G7 Reader, Sensors. Please send new rx to pharmacy if applicable. Prior authorization not submitted at this time.  Clista Bernhardt, CPhT Rx Patient Advocate Specialist Phone: [phone]

## 2021-09-21 NOTE — Addendum Note (Signed)
Addended by: Lauralyn Primes on: 09/21/2021 04:56 PM   Modules accepted: Orders

## 2021-10-19 ENCOUNTER — Other Ambulatory Visit: Payer: Self-pay | Admitting: Internal Medicine

## 2021-10-19 DIAGNOSIS — E1169 Type 2 diabetes mellitus with other specified complication: Secondary | ICD-10-CM

## 2021-11-13 ENCOUNTER — Ambulatory Visit (INDEPENDENT_AMBULATORY_CARE_PROVIDER_SITE_OTHER): Payer: Medicare Other | Admitting: Internal Medicine

## 2021-11-13 ENCOUNTER — Encounter: Payer: Self-pay | Admitting: Internal Medicine

## 2021-11-13 VITALS — BP 130/75 | HR 50 | Temp 97.9°F | Wt 297.0 lb

## 2021-11-13 DIAGNOSIS — B3731 Acute candidiasis of vulva and vagina: Secondary | ICD-10-CM | POA: Diagnosis not present

## 2021-11-13 DIAGNOSIS — L249 Irritant contact dermatitis, unspecified cause: Secondary | ICD-10-CM

## 2021-11-13 DIAGNOSIS — Z23 Encounter for immunization: Secondary | ICD-10-CM | POA: Diagnosis not present

## 2021-11-13 MED ORDER — FLUCONAZOLE 150 MG PO TABS: 150.0000 mg | ORAL_TABLET | Freq: Once | ORAL | 0 refills | Status: AC

## 2021-11-13 MED ORDER — CETIRIZINE HCL 10 MG PO TABS: 10.0000 mg | ORAL_TABLET | Freq: Every day | ORAL | 3 refills | Status: DC

## 2021-11-13 MED ORDER — TRIAMCINOLONE ACETONIDE 0.1 % EX CREA: 1.0000 | TOPICAL_CREAM | Freq: Two times a day (BID) | CUTANEOUS | 0 refills | Status: DC

## 2021-11-13 NOTE — Progress Notes (Signed)
Established Patient Office Visit     CC/Reason for Visit: rash and itching  HPI: Savannah Crawford is a 66 y.o. female who is coming in today for the above mentioned reasons. She has started using a dexcom monitor. Every place she has placed it she has had a rash at the site of adhesive. She also has a place on her neck. Wonders if she have touched that area after placing monitor on. Rash appears hyperpigmented.   Past Medical/Surgical History: Past Medical History:  Diagnosis Date   Anemia of chronic disease 11/17/2012   not on meds at this time   At risk for heart disease - +stres test 2012 followed by [facility], [place] with neg cardiac cath 03/2010 (non-obstructive CAD) per review of records, echo 02/2010 normal LVF 01/15/2012   Chronic kidney disease (CKD), stage IV (severe) ([place]) 01/09/2012   Diabetes mellitus with renal manifestations, uncontrolled    on meds   Diabetic neuropathy ([place]) 67/89/3810   Diastolic heart failure - stage 1 per review of echo results from 02/2010 01/15/2012   GERD (gastroesophageal reflux disease)    OTC meds for tx   Hyperlipidemia    on meds   Hypertension    on meds   Obesity    Peripheral arterial disease? Eval by vascular 2013 with no LE PAD and advised to stop pletal    Severe obesity (BMI >= 40) ([place]) 11/13/2012   Toe ulcer, right (HCC)    Ulcer of toe of left foot ([place])    treated by [facility] per patient   Venous stasis of lower extremity 03/10/2012    Past Surgical History:  Procedure Laterality Date   COLONOSCOPY  2013/2014   [place]- records not available    REFRACTIVE SURGERY Bilateral    TOE AMPUTATION Right 2008   RIGHT     Social History:  reports that she quit smoking about 22 years ago. Her smoking use included cigarettes. She has never used smokeless tobacco. She reports current alcohol use of about 4.0 standard drinks of alcohol per week. She reports that she does not currently use  drugs.  Allergies: No Known Allergies  Family History:  Family History  Problem Relation Age of Onset   Diabetes Mother    Hyperlipidemia Mother    Hypertension Mother    Heart attack Mother    Stroke Mother    Diabetes Sister    Breast cancer Sister 30   Hypertension Brother    Diabetes Sister    Diabetes Sister    Obesity Other    Hypertension Other    Hyperlipidemia Other    Stroke Other    Heart attack Other    Colon polyps Neg Hx    Colon cancer Neg Hx    Esophageal cancer Neg Hx    Rectal cancer Neg Hx    Stomach cancer Neg Hx      Current Outpatient Medications:    Accu-Chek Softclix Lancets lancets, Use as instructed to check blood sugar 4 times daily, Disp: 400 each, Rfl: 3   atorvastatin (LIPITOR) 20 MG tablet, TAKE 1 TABLET BY MOUTH EVERY DAY, Disp: 90 tablet, Rfl: 1   Blood Glucose Monitoring Suppl (ACCU-CHEK GUIDE ME) w/Device KIT, Use as instructed to check blood sugar 4 times daily, Disp: 1 kit, Rfl: 0   cetirizine (ZYRTEC) 10 MG tablet, Take 1 tablet (10 mg total) by mouth daily., Disp: 30 tablet, Rfl: 3   Continuous Blood Gluc  Sensor ([place]) MISC, Use as instructed to check blood sugar. Change every 10 days, Disp: 9 each, Rfl: 3   fluconazole (DIFLUCAN) 150 MG tablet, Take 1 tablet (150 mg total) by mouth once for 1 dose., Disp: 1 tablet, Rfl: 0   furosemide (LASIX) 40 MG tablet, TAKE 1 TABLET BY MOUTH TWICE A DAY, Disp: 180 tablet, Rfl: 1   glucose blood (ACCU-CHEK GUIDE) test strip, Use as instructed to check blood sugar 4 times daily, Disp: 400 each, Rfl: 3   insulin aspart (NOVOLOG FLEXPEN) 100 UNIT/ML FlexPen, Inject up to 25 units 3x a day before meals, Disp: 75 mL, Rfl: 3   Insulin Glargine (BASAGLAR KWIKPEN) 100 UNIT/ML, Inject 80 Units into the skin daily., Disp: 75 mL, Rfl: 03   Insulin Pen Needle (B-D UF III MINI PEN NEEDLES) 31G X 5 MM MISC, USE AS DIRECTED - 5X A DAY., Disp: 100 each, Rfl: 17   lisinopril (ZESTRIL) 10 MG tablet, TAKE  1 TABLET BY MOUTH EVERY DAY, Disp: 90 tablet, Rfl: 1   metoprolol succinate (TOPROL-XL) 25 MG 24 hr tablet, TAKE 1 TABLET BY MOUTH EVERY DAY, Disp: 90 tablet, Rfl: 1   mupirocin ointment (BACTROBAN) 2 %, SMARTSIG:1 Application Topical 2-3 Times Daily, Disp: , Rfl:    Semaglutide, 1 MG/DOSE, (OZEMPIC, 1 MG/DOSE,) 4 MG/3ML SOPN, Inject 1 mg into the skin once a week., Disp: 9 mL, Rfl: 3   triamcinolone cream (KENALOG) 0.1 %, Apply 1 Application topically 2 (two) times daily., Disp: 30 g, Rfl: 0   Vitamin D, Ergocalciferol, (DRISDOL) 1.25 MG (50000 UNIT) CAPS capsule, Take 1 capsule by mouth once a week., Disp: , Rfl:   Review of Systems:  Constitutional: Denies fever, chills, diaphoresis, appetite change and fatigue.  HEENT: Denies photophobia, eye pain, redness, hearing loss, ear pain, congestion, sore throat, rhinorrhea, sneezing, mouth sores, trouble swallowing, neck pain, neck stiffness and tinnitus.   Respiratory: Denies SOB, DOE, cough, chest tightness,  and wheezing.   Cardiovascular: Denies chest pain, palpitations and leg swelling.  Gastrointestinal: Denies nausea, vomiting, abdominal pain, diarrhea, constipation, blood in stool and abdominal distention.  Genitourinary: Denies dysuria, urgency, frequency, hematuria, flank pain and difficulty urinating.  Endocrine: Denies: hot or cold intolerance, sweats, changes in hair or nails, polyuria, polydipsia. Musculoskeletal: Denies myalgias, back pain, joint swelling, arthralgias and gait problem.  Skin: Denies pallor and wound.  Neurological: Denies dizziness, seizures, syncope, weakness, light-headedness, numbness and headaches.  Hematological: Denies adenopathy. Easy bruising, personal or family bleeding history  Psychiatric/Behavioral: Denies suicidal ideation, mood changes, confusion, nervousness, sleep disturbance and agitation    Physical Exam: Vitals:   11/13/21 1207 11/13/21 1219  BP: (!) 150/74 130/75  Pulse: (!) 50   Temp: 97.9  F (36.6 C)   TempSrc: Oral   SpO2: 99%   Weight: 297 lb (134.7 kg)     Body mass index is 42.62 kg/m.   Constitutional: NAD, calm, comfortable Eyes: PERRL, lids and conjunctivae normal ENMT: Mucous membranes are moist.   Skin: hyperpigmented, flat plaques over left base of neck and left upper arm. Psychiatric: Normal judgment and insight. Alert and oriented x 3. Normal mood.    Impression and Plan:  Irritant contact dermatitis, unspecified trigger - Plan: triamcinolone cream (KENALOG) 0.1 %, cetirizine (ZYRTEC) 10 MG tablet  Vaginal yeast infection - Plan: fluconazole (DIFLUCAN) 150 MG tablet  Need for influenza vaccination  -Flu vaccine given in office today. -Fluconazole given for presumptive vaginal yeast infection. -Suspect some contact dermatitis.  Advised daily antihistamine and triamcinolone cream. -She knows to follow up if no improvement.  Time spent:22 minutes reviewing chart, interviewing and examining patient and formulating plan of care.      Lelon Frohlich, MD [facility]

## 2021-12-18 ENCOUNTER — Ambulatory Visit (INDEPENDENT_AMBULATORY_CARE_PROVIDER_SITE_OTHER): Payer: Medicare Other | Admitting: Neurology

## 2021-12-18 ENCOUNTER — Other Ambulatory Visit: Payer: Self-pay | Admitting: Internal Medicine

## 2021-12-18 ENCOUNTER — Encounter: Payer: Self-pay | Admitting: Neurology

## 2021-12-18 ENCOUNTER — Other Ambulatory Visit (INDEPENDENT_AMBULATORY_CARE_PROVIDER_SITE_OTHER): Payer: Medicare Other

## 2021-12-18 VITALS — BP 159/70 | HR 100 | Ht 70.0 in | Wt 298.0 lb

## 2021-12-18 DIAGNOSIS — E1142 Type 2 diabetes mellitus with diabetic polyneuropathy: Secondary | ICD-10-CM | POA: Diagnosis not present

## 2021-12-18 DIAGNOSIS — N183 Chronic kidney disease, stage 3 unspecified: Secondary | ICD-10-CM

## 2021-12-18 NOTE — Progress Notes (Unsigned)
[facility] Clinic Note - Initial Visit   Date: 12/18/2021   Savannah Crawford MRN: [id number] DOB: August 11, 1955   Dear Dr. Jerilee Hoh:  Thank you for your kind referral of Savannah Crawford for consultation of bilateral leg tingling. Although her history is well known to you, please allow [place] to reiterate it for the purpose of our medical record. The patient was accompanied to the clinic by self.     Savannah Crawford is a 66 y.o. 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   IMPRESSION/PLAN: Diabetic neuropathy affecting the feet manifesting with distal paresthesias. Her neurological examination shows a distal predominant large fiber peripheral neuropathy. I had extensive discussion with the patient regarding the pathogenesis, etiology, management, and natural course of neuropathy. Management is symptomatic.  To be sure there is no other contributor to her neuropathy, I will check TSH and vitamin B12.  For episodic pain, ok to try OTC lidocaine ointment.  Regarding his bilateral hip tingling and pain, symptoms are very infrequency, last occurred 2 months ago. The nature of her symptoms does not suggest lumbosacral radiculopathy, however, if this recurs and she develops radicular symptoms, PT would be the next step.    Return to clinic as needed  ------------------------------------------------------------- History of present illness: She complains of numbness/tingling involving the hip, which is worse with standing and improved with repositioning.  It last occurred 2 months ago.  No associated weakness.   She also reports tingling and shooting pain in the feet which lasts 1- 3 days.  She has associated numbness.  She had poor balance and has not had any falls.  She walks unassisted.  Symptoms are not constant and more annoying than painful.   She works at [employer].  She lives at home with husband, daughter, and granddaughter.    Out-side paper records, electronic medical record, and images have been reviewed where available and summarized as:  Lab Results  Component Value Date   HGBA1C 7.6 (A) 05/05/2021   Lab Results  Component Value Date   [id number] 076 11/03/2018   No results found for: "TSH" No results found for: "ESRSEDRATE", "POCTSEDRATE"  Past Medical History:  Diagnosis Date   Anemia of chronic disease 11/17/2012   not on meds at this time   At risk for heart disease - +stres test 2012 followed by [facility], [place] with neg cardiac cath 03/2010 (non-obstructive CAD) per review of records, echo 02/2010 normal LVF 01/15/2012   Chronic kidney disease (CKD), stage IV (severe) ([place]) 01/09/2012   Diabetes mellitus with renal manifestations, uncontrolled    on meds   Diabetic neuropathy ([place]) 22/63/3354   Diastolic heart failure - stage 1 per review of echo results from 02/2010 01/15/2012   GERD (gastroesophageal reflux disease)    OTC meds for tx   Hyperlipidemia    on meds   Hypertension    on meds   Obesity    Peripheral arterial disease? Eval by vascular 2013 with no LE PAD and advised to stop pletal    Severe obesity (BMI >= 40) ([place]) 11/13/2012   Toe ulcer, right (HCC)    Ulcer of toe of left foot ([place])    treated by [facility] per patient   Venous stasis of lower extremity 03/10/2012    Past Surgical History:  Procedure Laterality Date   COLONOSCOPY  2013/2014  [place] not available    REFRACTIVE SURGERY Bilateral    TOE AMPUTATION Right 2008   RIGHT      Medications:  Outpatient Encounter Medications as of 12/18/2021  Medication Sig   Accu-Chek Softclix Lancets lancets Use as instructed to check blood sugar 4 times daily   atorvastatin (LIPITOR) 20 MG tablet TAKE 1 TABLET BY MOUTH EVERY DAY   Blood Glucose  Monitoring Suppl (ACCU-CHEK GUIDE ME) w/Device KIT Use as instructed to check blood sugar 4 times daily   cetirizine (ZYRTEC) 10 MG tablet Take 1 tablet (10 mg total) by mouth daily.   Continuous Blood Gluc Sensor (DEXCOM G7 SENSOR) MISC Use as instructed to check blood sugar. Change every 10 days   furosemide (LASIX) 40 MG tablet TAKE 1 TABLET BY MOUTH TWICE A DAY   glucose blood (ACCU-CHEK GUIDE) test strip Use as instructed to check blood sugar 4 times daily   insulin aspart (NOVOLOG FLEXPEN) 100 UNIT/ML FlexPen INJECT UP TO 90 UNITS DAILY AS INSTRUCTED.   Insulin Glargine (BASAGLAR KWIKPEN) 100 UNIT/ML Inject 80 Units into the skin daily.   Insulin Pen Needle (B-D UF III MINI PEN NEEDLES) 31G X 5 MM MISC USE AS DIRECTED - 5X A DAY.   lisinopril (ZESTRIL) 10 MG tablet TAKE 1 TABLET BY MOUTH EVERY DAY   metoprolol succinate (TOPROL-XL) 25 MG 24 hr tablet TAKE 1 TABLET BY MOUTH EVERY DAY   mupirocin ointment (BACTROBAN) 2 % SMARTSIG:1 Application Topical 2-3 Times Daily   Semaglutide, 1 MG/DOSE, (OZEMPIC, 1 MG/DOSE,) 4 MG/3ML SOPN Inject 1 mg into the skin once a week.   triamcinolone cream (KENALOG) 0.1 % Apply 1 Application topically 2 (two) times daily.   Vitamin D, Ergocalciferol, (DRISDOL) 1.25 MG (50000 UNIT) CAPS capsule Take 1 capsule by mouth once a week.   No facility-administered encounter medications on file as of 12/18/2021.    Allergies: No Known Allergies  Family History: Family History  Problem Relation Age of Onset   Diabetes Mother    Hyperlipidemia Mother    Hypertension Mother    Heart attack Mother    Stroke Mother    Diabetes Sister    Breast cancer Sister 57   Hypertension Brother    Diabetes Sister    Diabetes Sister    Obesity Other    Hypertension Other    Hyperlipidemia Other    Stroke Other    Heart attack Other    Colon polyps Neg Hx    Colon cancer Neg Hx    Esophageal cancer Neg Hx    Rectal cancer Neg Hx    Stomach cancer Neg Hx     Social  History: Social History   Tobacco Use   Smoking status: Former    Types: Cigarettes    Quit date: 02/20/1999    Years since quitting: 22.8   Smokeless tobacco: Never  Vaping Use   Vaping Use: Never used  Substance Use Topics   Alcohol use: Yes    Alcohol/week: 4.0 standard drinks of alcohol    Types: 4 Standard drinks or equivalent per week    Comment: Occasional Drinks   Drug use: Not Currently   Social History   Social History Narrative   Are you right handed or left handed? Right Handed    Are you currently employed ? Yes. Work 2-3 days at [employer] is your current occupation?   Do you live at home alone? No   Who lives with  you? Husband, daughter, and granddaughter    What type of home do you live in: 1 story or 2 story? One story home         Vital Signs:  BP (!) 159/70   Pulse 100   Ht 5' 10" (1.778 m)   Wt 298 lb (135.2 kg)   SpO2 95%   BMI 42.76 kg/m   Neurological Exam: MENTAL STATUS including orientation to time, place, person, recent and remote memory, attention span and concentration, language, and fund of knowledge is normal.  Speech is not dysarthric.  CRANIAL NERVES: II:  No visual field defects.   III-IV-VI: Pupils equal round and reactive to light.  Normal conjugate, extra-ocular eye movements in all directions of gaze.  No nystagmus.  No ptosis.   V:  Normal facial sensation.    VII:  Normal facial symmetry and movements.   VIII:  Normal hearing and vestibular function.   IX-X:  Normal palatal movement.   XI:  Normal shoulder shrug and head rotation.   XII:  Normal tongue strength and range of motion, no deviation or fasciculation.  MOTOR:  No atrophy, fasciculations or abnormal movements.  No pronator drift.   Upper Extremity:  Right  Left  Deltoid  5/5   5/5   Biceps  5/5   5/5   Triceps  5/5   5/5   Wrist extensors  5/5   5/5   Wrist flexors  5/5   5/5   Finger extensors  5/5   5/5   Finger flexors  5/5   5/5   Dorsal interossei   5/5   5/5   Abductor pollicis  5/5   5/5   Tone (Ashworth scale)  0  0   Lower Extremity:  Right  Left  Hip flexors  5/5   5/5   Knee flexors  5/5   5/5   Knee extensors  5/5   5/5   Dorsiflexors  5/5   5/5   Plantarflexors  5/5   5/5   Toe extensors  5/5   5/5   Toe flexors  5/5   5/5   Tone (Ashworth scale)  0  0   MSRs:                                           Right        Left brachioradialis 2+  2+  biceps 2+  2+  triceps 2+  2+  patellar 2+  2+  ankle jerk 0  0  Hoffman no  no  plantar response down  down    SENSORY:  Reduced pin prick, temperature, and vibration below the ankles bilaterally.  Sensation intact in the arms.   Romberg's sign mildly positive.   COORDINATION/GAIT: Normal finger-to- nose-finger. Intact rapid alternating movements bilaterally.   Gait mildly wide-based and stable. Mild unsteadiness with tandem and stressed gait .    Thank you for allowing me to participate in patient's care.  If I can answer any additional questions, I would be pleased to do so.    Sincerely,    Reeanna Acri K. Posey Pronto, DO

## 2021-12-18 NOTE — Patient Instructions (Addendum)
Check labs  You can apply over-the-counter lidocaine ointment to your feet as needed for pain

## 2021-12-19 LAB — VITAMIN B12: Vitamin B-12: 426 pg/mL (ref 211–911)

## 2021-12-19 LAB — TSH: TSH: 0.55 u[IU]/mL (ref 0.35–5.50)

## 2021-12-20 ENCOUNTER — Telehealth: Payer: Self-pay | Admitting: Neurology

## 2021-12-20 ENCOUNTER — Other Ambulatory Visit: Payer: Self-pay | Admitting: Internal Medicine

## 2021-12-20 DIAGNOSIS — N183 Chronic kidney disease, stage 3 unspecified: Secondary | ICD-10-CM

## 2021-12-20 NOTE — Telephone Encounter (Signed)
Patient states that she is returning a phone call to our office and who called did not leave a message

## 2021-12-20 NOTE — Telephone Encounter (Signed)
Called patient and informed her that I did not leave a message because her voicemail was full. Also informed patient that her labs results were normal. See result notes.

## 2021-12-21 ENCOUNTER — Other Ambulatory Visit: Payer: Self-pay | Admitting: Internal Medicine

## 2021-12-21 DIAGNOSIS — E1122 Type 2 diabetes mellitus with diabetic chronic kidney disease: Secondary | ICD-10-CM

## 2021-12-21 DIAGNOSIS — I152 Hypertension secondary to endocrine disorders: Secondary | ICD-10-CM

## 2022-01-15 ENCOUNTER — Encounter: Payer: Self-pay | Admitting: Internal Medicine

## 2022-01-15 ENCOUNTER — Ambulatory Visit (INDEPENDENT_AMBULATORY_CARE_PROVIDER_SITE_OTHER): Payer: Medicare Other | Admitting: Internal Medicine

## 2022-01-15 VITALS — BP 132/74 | HR 82 | Ht 70.0 in | Wt 294.6 lb

## 2022-01-15 DIAGNOSIS — E1169 Type 2 diabetes mellitus with other specified complication: Secondary | ICD-10-CM

## 2022-01-15 DIAGNOSIS — Z794 Long term (current) use of insulin: Secondary | ICD-10-CM | POA: Diagnosis not present

## 2022-01-15 DIAGNOSIS — E785 Hyperlipidemia, unspecified: Secondary | ICD-10-CM

## 2022-01-15 DIAGNOSIS — N183 Chronic kidney disease, stage 3 unspecified: Secondary | ICD-10-CM

## 2022-01-15 DIAGNOSIS — E1122 Type 2 diabetes mellitus with diabetic chronic kidney disease: Secondary | ICD-10-CM | POA: Diagnosis not present

## 2022-01-15 LAB — POCT GLYCOSYLATED HEMOGLOBIN (HGB A1C): Hemoglobin A1C: 7.1 % — AB (ref 4.0–5.6)

## 2022-01-15 NOTE — Patient Instructions (Addendum)
Please use the following regimen: - Basaglar 35 units in a.m. and 35 units at night - Novolog dose: (15) - (15-20) - (15-25) units 15 min before every meal - Ozempic 1 mg weekly  Please return in 3-4 months.

## 2022-01-15 NOTE — Progress Notes (Signed)
Subjective:     Patient ID: Savannah Crawford, female   DOB: 1956/01/26, 66 y.o.   MRN: [id number]  HPI Savannah Crawford is a 66 y.o. woman returning for f/u for DM2, insulin-dependent, uncontrolled, with complications (CAD, CKD, diabetes neuropathy, diabetic retinopathy, PVD, amputated toe-R). Last visit 4 months ago.  Interim history: No increased urination, blurry vision, nausea, chest pain.   She continues to have leg swelling, which is chronic for her.  Reviewed HbA1c levels: 09/14/2021: HbA1c 7.9% Lab Results  Component Value Date   HGBA1C 7.6 (A) 05/05/2021   HGBA1C 8.6 (A) 01/06/2021   HGBA1C 7.8 (A) 07/26/2020   She is on: - Basaglar 40 units in a.m. and 30-40 units at night  - Novolog dose: (15-25) - (15-20) - (15-20) units before meals  - Novolog Sliding scale: - 150- 165: + 1 unit  - 166- 180: + 2 units  - 181- 195: + 3 units  - 196- 210: + 4 units  - >210: + 5 units  Take NovoLog 15 minutes before every meal. - Ozempic 0.5 mg weekly- started 03/2020 >> 1 mg weekly  I again suggested Ozempic 0.5 mg weekly in the past, also: 11/2018 >> did not start as she did not want to add another med... In 03/2019, she did try to start it but this was expensive. She was on metformin in the past, but stopped 2/2 CKD.  Tried Victoza before >> cannot remember SE.  Checks sugars >4x a day:  Prev. - am: 85-177 >> 110-147 >> 98-126, 137 >> 78-142 - after b'fast: 128-183 >> n/c - prelunch:185 >> 170-180 >> n/c  - after lunch: 236 >> n/c - predinner: 144, 153 >> n/c >> 140s, 178 >> 148, 290 - after dinner: 119-195 >> 170-212 >> 174 >> n/c - bedtime: n/c >> 135-217, 366 >> n/c  Lowest: 68 >> .Marland Kitchen.  100 >> 78 Highest sugar 212 >> 178 >> 290 (missed both injection)  Meter: Freestyle Lite  + CKD-sees nephrology: Lab Results  Component Value Date   BUN 18 01/10/2021   CREATININE 1.65 (H) 01/10/2021  On lisinopril 10.  + HL; Latest lipids: Lab Results  Component Value Date   CHOL  128 01/10/2021   HDL 49.50 01/10/2021   LDLCALC 62 01/10/2021   TRIG 83.0 01/10/2021   CHOLHDL 3 01/10/2021  On Lipitor 20.  Last eye exam 09/14/2021: + DR, + cataract reportedly.   + Stable numbness and tingling in her feet.  She sees podiatry (Dr. Melony Overly).  She has a history of left third toe ulcer. Last podiatry visit 03/2021.  She has a history of skin graft fourth toe ulcer.  She has a history of nonobstructive CAD, with positive stress test but a negative cardiac cath 2012, and also has diastolic heart failure. She has HTN, venous stasis, vitamin D deficiency.  She denies family history of medullary thyroid cancer and no personal history of pancreatitis.  Review of Systems + see HPI  I reviewed pt's medications, allergies, PMH, social hx, family hx, and changes were documented in the history of present illness. Otherwise, unchanged from my initial visit note.  Past Medical History:  Diagnosis Date   Anemia of chronic disease 11/17/2012   not on meds at this time   At risk for heart disease - +stres test 2012 followed by [facility], [place] with neg cardiac cath 03/2010 (non-obstructive CAD) per review of records, echo 02/2010 normal LVF 01/15/2012   Chronic kidney disease (  CKD), stage IV (severe) ([place]) 01/09/2012   Diabetes mellitus with renal manifestations, uncontrolled    on meds   Diabetic neuropathy ([place]) 25/06/3974   Diastolic heart failure - stage 1 per review of echo results from 02/2010 01/15/2012   GERD (gastroesophageal reflux disease)    OTC meds for tx   Hyperlipidemia    on meds   Hypertension    on meds   Obesity    Peripheral arterial disease? Eval by vascular 2013 with no LE PAD and advised to stop pletal    Severe obesity (BMI >= 40) ([place]) 11/13/2012   Toe ulcer, right (HCC)    Ulcer of toe of left foot ([place])    treated by [facility] per patient   Venous stasis of lower extremity 03/10/2012   Past Surgical History:  Procedure Laterality Date    COLONOSCOPY  2013/2014   [place]- records not available    REFRACTIVE SURGERY Bilateral    TOE AMPUTATION Right 2008   RIGHT    Social History   Socioeconomic History   Marital status: Married    Spouse name: Not on file   Number of children: 2   Years of education: Not on file   Highest education level: Not on file  Occupational History   Not on file  Tobacco Use   Smoking status: Former    Types: Cigarettes    Quit date: 02/20/1999    Years since quitting: 22.9   Smokeless tobacco: Never  Vaping Use   Vaping Use: Never used  Substance and Sexual Activity   Alcohol use: Yes    Alcohol/week: 4.0 standard drinks of alcohol    Types: 4 Standard drinks or equivalent per week    Comment: Occasional Drinks   Drug use: Not Currently   Sexual activity: Not on file  Other Topics Concern   Not on file  Social History Narrative   Are you right handed or left handed? Right Handed    Are you currently employed ? Yes. Work 2-3 days at [employer] is your current occupation?   Do you live at home alone? No   Who lives with you? Husband, daughter, and granddaughter    What type of home do you live in: 1 story or 2 story? One story home        Social Determinants of Health   Financial Resource Strain: Not on file  Food Insecurity: No Food Insecurity (05/30/2020)   Hunger Vital Sign    Worried About Running Out of Food in the Last Year: Never true    Ran Out of Food in the Last Year: Never true  Transportation Needs: No Transportation Needs (05/30/2020)   PRAPARE - Hydrologist (Medical): No    Lack of Transportation (Non-Medical): No  Physical Activity: Insufficiently Active (05/30/2020)   Exercise Vital Sign    Days of Exercise per Week: 2 days    Minutes of Exercise per Session: 60 min  Stress: Not on file  Social Connections: Unknown (05/30/2020)   Social Connection and Isolation Panel [NHANES]    Frequency of Communication with Friends and  Family: Patient refused    Frequency of Social Gatherings with Friends and Family: Patient refused    Attends Religious Services: More than 4 times per year    Active Member of [employer] or Organizations: Yes    Attends Archivist Meetings: More than 4 times per year    Marital Status:  Married  Intimate Partner Violence: Not At Risk (05/30/2020)   Humiliation, Afraid, Rape, and Kick questionnaire    Fear of Current or Ex-Partner: No    Emotionally Abused: No    Physically Abused: No    Sexually Abused: No   Current Outpatient Medications on File Prior to Visit  Medication Sig Dispense Refill   Accu-Chek Softclix Lancets lancets Use as instructed to check blood sugar 4 times daily 400 each 3   atorvastatin (LIPITOR) 20 MG tablet TAKE 1 TABLET BY MOUTH EVERY DAY 90 tablet 1   Blood Glucose Monitoring Suppl (ACCU-CHEK GUIDE ME) w/Device KIT Use as instructed to check blood sugar 4 times daily 1 kit 0   cetirizine (ZYRTEC) 10 MG tablet Take 1 tablet (10 mg total) by mouth daily. 30 tablet 3   Continuous Blood Gluc Sensor (DEXCOM G7 SENSOR) MISC Use as instructed to check blood sugar. Change every 10 days 9 each 3   furosemide (LASIX) 40 MG tablet TAKE 1 TABLET BY MOUTH TWICE A DAY 180 tablet 1   glucose blood (ACCU-CHEK GUIDE) test strip Use as instructed to check blood sugar 4 times daily 400 each 3   insulin aspart (NOVOLOG FLEXPEN) 100 UNIT/ML FlexPen INJECT UP TO 90 UNITS DAILY AS INSTRUCTED. 75 mL 1   Insulin Glargine (BASAGLAR KWIKPEN) 100 UNIT/ML INJECT UP TO 80 UNITS A DAY AT BEDTIME 75 mL 0   Insulin Pen Needle (B-D UF III MINI PEN NEEDLES) 31G X 5 MM MISC USE AS DIRECTED - 5X A DAY. 100 each 17   lisinopril (ZESTRIL) 10 MG tablet TAKE 1 TABLET BY MOUTH EVERY DAY 90 tablet 1   metoprolol succinate (TOPROL-XL) 25 MG 24 hr tablet TAKE 1 TABLET BY MOUTH EVERY DAY 90 tablet 1   mupirocin ointment (BACTROBAN) 2 % SMARTSIG:1 Application Topical 2-3 Times Daily     Semaglutide, 1  MG/DOSE, (OZEMPIC, 1 MG/DOSE,) 4 MG/3ML SOPN Inject 1 mg into the skin once a week. 9 mL 3   triamcinolone cream (KENALOG) 0.1 % Apply 1 Application topically 2 (two) times daily. 30 g 0   Vitamin D, Ergocalciferol, (DRISDOL) 1.25 MG (50000 UNIT) CAPS capsule Take 1 capsule by mouth once a week.     No current facility-administered medications on file prior to visit.   No Known Allergies Family History  Problem Relation Age of Onset   Diabetes Mother    Hyperlipidemia Mother    Hypertension Mother    Heart attack Mother    Stroke Mother    Diabetes Sister    Breast cancer Sister 22   Hypertension Brother    Diabetes Sister    Diabetes Sister    Obesity Other    Hypertension Other    Hyperlipidemia Other    Stroke Other    Heart attack Other    Colon polyps Neg Hx    Colon cancer Neg Hx    Esophageal cancer Neg Hx    Rectal cancer Neg Hx    Stomach cancer Neg Hx    Objective:   Physical Exam BP 132/74 (BP Location: Left Arm, Patient Position: Sitting, Cuff Size: Normal)   Pulse 82   Ht 5' 10" (1.778 m)   Wt 294 lb 9.6 oz (133.6 kg)   SpO2 98%   BMI 42.27 kg/m   Wt Readings from Last 3 Encounters:  01/15/22 294 lb 9.6 oz (133.6 kg)  12/18/21 298 lb (135.2 kg)  11/13/21 297 lb (134.7 kg)   Constitutional: overweight,  in NAD Eyes:  no exophthalmos ENT:  no thyromegaly, no cervical lymphadenopathy Cardiovascular: RRR, No MRG, + B LE edema Respiratory: CTA B Musculoskeletal: no deformities Skin: moist, warm, no rashes Neurological: no tremor with outstretched hands  Assessment:     1. DM2, insulin-dependent, uncontrolled, with complications - nonobstructive CAD - CKD - diabetes neuropathy - diabetic retinopathy - PVD - R amputated toe  2. HL  3. Obesity class 3    Plan:     1. Pt with history of uncontrolled type 2 diabetes, on basal bolus insulin regimen and also GLP-1 receptor agonist, with improved blood sugars since last visit.  At last visit, HbA1c  was 7.9% but she was forgetting many insulin doses.  Since then, she was able to obtain freestyle libre CGM and sugars started to improve. -She tolerates Ozempic well, but with some degree of constipation.  She declined to try an SGLT2 inhibitor. CGM interpretation: -At today's visit, we reviewed her CGM downloads: It appears that 71% of values are in target range (goal >70%), while 28% are higher than 180 (goal <25%), and 1% are lower than 70 (goal <4%).  The calculated average blood sugar is 155.  The projected HbA1c for the next 3 months (GMI) is 7.0%. -Reviewing the CGM trends, sugars are fluctuating mostly within the target range but she has a more pronounced peaks after breakfast and then an occasional peak after dinner.  Upon questioning, she is not taking the insulin before breakfast as she is afraid that she may drop her breakfast too low.  We discussed about taking a lower dose of NovoLog in the morning and we also backed off her Basaglar doses.  Before dinner, I advised her that with larger meals, she may need to take slightly more NovoLog.  We can continue the Ozempic at the same dose for now. -  I advised her to: Patient Instructions  Please use the following regimen: - Basaglar 35 units in a.m. and 35 units at night - Novolog dose: (15) - (15-20) - (15-25) units 15 min before every meal - Ozempic 1 mg weekly  Please return in 3-4 months.  - we checked her HbA1c: 7.1% (lower) - advised to check sugars at different times of the day - 4x a day, rotating check times - advised for yearly eye exams >> she is UTD - return to clinic in 3-4 months  2. HL -Reviewed latest lipid panel from 12/2020: Fractions at goal except for an LDL higher than our goal of less than 55 due to history of cardiovascular disease Lab Results  Component Value Date   CHOL 128 01/10/2021   HDL 49.50 01/10/2021   LDLCALC 62 01/10/2021   TRIG 83.0 01/10/2021   CHOLHDL 3 01/10/2021  -She continues on Lipitor 20  mg daily without side effects  3.  Obesity class III -Continues on Ozempic, which can also help with weight loss.  She would not want to increase the dose -She lost 13 pounds before the last 3 visits combined -Since last visit, she lost 5 pounds  Philemon Kingdom, MD PhD [facility]

## 2022-01-22 ENCOUNTER — Other Ambulatory Visit: Payer: Self-pay | Admitting: Internal Medicine

## 2022-01-22 DIAGNOSIS — L249 Irritant contact dermatitis, unspecified cause: Secondary | ICD-10-CM

## 2022-01-22 DIAGNOSIS — E785 Hyperlipidemia, unspecified: Secondary | ICD-10-CM

## 2022-01-31 ENCOUNTER — Encounter: Payer: Medicare Other | Admitting: Internal Medicine

## 2022-02-08 ENCOUNTER — Other Ambulatory Visit: Payer: Self-pay | Admitting: Internal Medicine

## 2022-02-08 DIAGNOSIS — L249 Irritant contact dermatitis, unspecified cause: Secondary | ICD-10-CM

## 2022-02-22 ENCOUNTER — Encounter: Payer: Self-pay | Admitting: Internal Medicine

## 2022-02-22 ENCOUNTER — Ambulatory Visit (INDEPENDENT_AMBULATORY_CARE_PROVIDER_SITE_OTHER): Payer: Medicare Other | Admitting: Internal Medicine

## 2022-02-22 VITALS — BP 130/74 | HR 67 | Temp 98.3°F | Ht 70.0 in | Wt 299.1 lb

## 2022-02-22 DIAGNOSIS — E785 Hyperlipidemia, unspecified: Secondary | ICD-10-CM | POA: Diagnosis not present

## 2022-02-22 DIAGNOSIS — Z0000 Encounter for general adult medical examination without abnormal findings: Secondary | ICD-10-CM | POA: Diagnosis not present

## 2022-02-22 DIAGNOSIS — E1159 Type 2 diabetes mellitus with other circulatory complications: Secondary | ICD-10-CM

## 2022-02-22 DIAGNOSIS — Z23 Encounter for immunization: Secondary | ICD-10-CM

## 2022-02-22 DIAGNOSIS — E1142 Type 2 diabetes mellitus with diabetic polyneuropathy: Secondary | ICD-10-CM

## 2022-02-22 DIAGNOSIS — I878 Other specified disorders of veins: Secondary | ICD-10-CM

## 2022-02-22 DIAGNOSIS — I5033 Acute on chronic diastolic (congestive) heart failure: Secondary | ICD-10-CM

## 2022-02-22 DIAGNOSIS — I152 Hypertension secondary to endocrine disorders: Secondary | ICD-10-CM

## 2022-02-22 DIAGNOSIS — Z13820 Encounter for screening for osteoporosis: Secondary | ICD-10-CM

## 2022-02-22 DIAGNOSIS — Z780 Asymptomatic menopausal state: Secondary | ICD-10-CM

## 2022-02-22 DIAGNOSIS — N184 Chronic kidney disease, stage 4 (severe): Secondary | ICD-10-CM

## 2022-02-22 NOTE — Progress Notes (Signed)
Established Patient Office Visit     CC/Reason for Visit: Welcome to Medicare visit  HPI: Savannah Crawford is a 67 y.o. female who is coming in today for the above mentioned reasons. Past Medical History is significant for: Insulin-dependent diabetes followed by Dr. Cruzita Lederer, chronic kidney disease stage IV, hyperlipidemia, hypertension, morbid obesity, heart failure with preserved ejection fraction, PAD.  She is feeling well.  She has routine eye care, will obtain records.  No recent dental exam, no perceived hearing difficulty.  She does aerobic exercises twice a week.  She is requesting a pneumonia vaccine.   Past Medical/Surgical History: Past Medical History:  Diagnosis Date   Anemia of chronic disease 11/17/2012   not on meds at this time   At risk for heart disease - +stres test 2012 followed by [facility], [place] with neg cardiac cath 03/2010 (non-obstructive CAD) per review of records, echo 02/2010 normal LVF 01/15/2012   Chronic kidney disease (CKD), stage IV (severe) ([place]) 01/09/2012   Diabetes mellitus with renal manifestations, uncontrolled    on meds   Diabetic neuropathy ([place]) 63/81/7711   Diastolic heart failure - stage 1 per review of echo results from 02/2010 01/15/2012   GERD (gastroesophageal reflux disease)    OTC meds for tx   Hyperlipidemia    on meds   Hypertension    on meds   Obesity    Peripheral arterial disease? Eval by vascular 2013 with no LE PAD and advised to stop pletal    Severe obesity (BMI >= 40) ([place]) 11/13/2012   Toe ulcer, right (HCC)    Ulcer of toe of left foot ([place])    treated by [facility] per patient   Venous stasis of lower extremity 03/10/2012    Past Surgical History:  Procedure Laterality Date   COLONOSCOPY  2013/2014   [place]- records not available    REFRACTIVE SURGERY Bilateral    TOE AMPUTATION Right 2008   RIGHT     Social History:  reports that she quit smoking about 23 years ago. Her smoking use  included cigarettes. She has never used smokeless tobacco. She reports current alcohol use of about 4.0 standard drinks of alcohol per week. She reports that she does not currently use drugs.  Allergies: No Known Allergies  Family History:  Family History  Problem Relation Age of Onset   Diabetes Mother    Hyperlipidemia Mother    Hypertension Mother    Heart attack Mother    Stroke Mother    Diabetes Sister    Breast cancer Sister 35   Hypertension Brother    Diabetes Sister    Diabetes Sister    Obesity Other    Hypertension Other    Hyperlipidemia Other    Stroke Other    Heart attack Other    Colon polyps Neg Hx    Colon cancer Neg Hx    Esophageal cancer Neg Hx    Rectal cancer Neg Hx    Stomach cancer Neg Hx      Current Outpatient Medications:    Accu-Chek Softclix Lancets lancets, Use as instructed to check blood sugar 4 times daily, Disp: 400 each, Rfl: 3   atorvastatin (LIPITOR) 20 MG tablet, TAKE 1 TABLET BY MOUTH EVERY DAY, Disp: 90 tablet, Rfl: 0   Blood Glucose Monitoring Suppl (ACCU-CHEK GUIDE ME) w/Device KIT, Use as instructed to check blood sugar 4 times daily, Disp: 1 kit, Rfl: 0   Continuous Blood  Gluc Sensor (DEXCOM G7 SENSOR) MISC, Use as instructed to check blood sugar. Change every 10 days, Disp: 9 each, Rfl: 3   furosemide (LASIX) 40 MG tablet, TAKE 1 TABLET BY MOUTH TWICE A DAY, Disp: 180 tablet, Rfl: 1   glucose blood (ACCU-CHEK GUIDE) test strip, Use as instructed to check blood sugar 4 times daily, Disp: 400 each, Rfl: 3   insulin aspart (NOVOLOG FLEXPEN) 100 UNIT/ML FlexPen, INJECT UP TO 90 UNITS DAILY AS INSTRUCTED., Disp: 75 mL, Rfl: 1   Insulin Glargine (BASAGLAR KWIKPEN) 100 UNIT/ML, INJECT UP TO 80 UNITS A DAY AT BEDTIME, Disp: 75 mL, Rfl: 0   Insulin Pen Needle (B-D UF III MINI PEN NEEDLES) 31G X 5 MM MISC, USE AS DIRECTED - 5X A DAY., Disp: 100 each, Rfl: 17   lisinopril (ZESTRIL) 10 MG tablet, TAKE 1 TABLET BY MOUTH EVERY DAY, Disp: 90  tablet, Rfl: 1   metoprolol succinate (TOPROL-XL) 25 MG 24 hr tablet, TAKE 1 TABLET BY MOUTH EVERY DAY, Disp: 90 tablet, Rfl: 1   mupirocin ointment (BACTROBAN) 2 %, SMARTSIG:1 Application Topical 2-3 Times Daily, Disp: , Rfl:    Semaglutide, 1 MG/DOSE, (OZEMPIC, 1 MG/DOSE,) 4 MG/3ML SOPN, Inject 1 mg into the skin once a week., Disp: 9 mL, Rfl: 3   Vitamin D, Ergocalciferol, (DRISDOL) 1.25 MG (50000 UNIT) CAPS capsule, Take 1 capsule by mouth once a week., Disp: , Rfl:   Review of Systems:  Constitutional: Denies fever, chills, diaphoresis, appetite change and fatigue.  HEENT: Denies photophobia, eye pain, redness, hearing loss, ear pain, congestion, sore throat, rhinorrhea, sneezing, mouth sores, trouble swallowing, neck pain, neck stiffness and tinnitus.   Respiratory: Denies SOB, DOE, cough, chest tightness,  and wheezing.   Cardiovascular: Denies chest pain, palpitations and leg swelling.  Gastrointestinal: Denies nausea, vomiting, abdominal pain, diarrhea, constipation, blood in stool and abdominal distention.  Genitourinary: Denies dysuria, urgency, frequency, hematuria, flank pain and difficulty urinating.  Endocrine: Denies: hot or cold intolerance, sweats, changes in hair or nails, polyuria, polydipsia. Musculoskeletal: Denies myalgias, back pain, joint swelling, arthralgias and gait problem.  Skin: Denies pallor, rash and wound.  Neurological: Denies dizziness, seizures, syncope, weakness, light-headedness, numbness and headaches.  Hematological: Denies adenopathy. Easy bruising, personal or family bleeding history  Psychiatric/Behavioral: Denies suicidal ideation, mood changes, confusion, nervousness, sleep disturbance and agitation    Physical Exam: Vitals:   02/22/22 1323  BP: 130/74  Pulse: 67  Temp: 98.3 F (36.8 C)  TempSrc: Oral  SpO2: 99%  Weight: 299 lb 1.6 oz (135.7 kg)  Height: _0  (1.778 m)    Body mass index is 42.92 kg/m.   Constitutional: NAD, calm,  comfortable, obese Eyes: PERRL, lids and conjunctivae normal, wears corrective lenses ENMT: Mucous membranes are moist. Posterior pharynx clear of any exudate or lesions. Normal dentition. Tympanic membrane is pearly white, no erythema or bulging. Neck: normal, supple, no masses, no thyromegaly Respiratory: clear to auscultation bilaterally, no wheezing, no crackles. Normal respiratory effort. No accessory muscle use.  Cardiovascular: Regular rate and rhythm, no murmurs / rubs / gallops. No extremity edema. 2+ pedal pulses. No carotid bruits.  Abdomen: no tenderness, no masses palpated. No hepatosplenomegaly. Bowel sounds positive.  Musculoskeletal: no clubbing / cyanosis. No joint deformity upper and lower extremities. Good ROM, no contractures. Normal muscle tone.  Skin: no rashes, lesions, ulcers. No induration Neurologic: CN 2-12 grossly intact. Sensation intact, DTR normal. Strength 5/5 in all 4.  Psychiatric: Normal judgment and insight. Alert and  oriented x 3. Normal mood.   Initial Medicare wellness visit   1. Risk factors, based on past  M,S,F -cardiovascular disease risk factors include age, obesity, diabetes, hyperlipidemia, hypertension   2.  Physical activities: 2 days a week of aerobic activities at the senior center   3.  Depression/mood: Stable, not depressed   4.  Hearing: No perceived issues   5.  ADL's: Independent in all ADLs   6.  Fall risk: Low fall risk   7.  Home safety: No problems identified   8.  Height weight, and visual acuity: height and weight as above, vision:  Vision Screening   Right eye Left eye Both eyes  Without correction     With correction _0     9.  Counseling: Advised to update Tdap   10. Lab orders based on risk factors: Laboratory update will be reviewed   11. Referral : DEXA   12. Care plan: Follow-up with me in 6 months   13. Cognitive assessment: No cognitive impairment   14. Screening: Patient provided with a  written and personalized 5-10 year screening schedule in the AVS. yes   15. Provider List Update: PCP, endocrinology Dr. Cruzita Lederer  16. Advance Directives: Full code   17. Opioids: Patient is not on any opioid prescriptions and has no risk factors for a substance use disorder.   [place] Office Visit from 02/22/2022 in [place] at [place]  PHQ-9 Total Score 1          02/22/2021    4:30 PM 08/29/2021    3:59 PM 11/13/2021   12:14 PM 12/18/2021    2:46 PM 02/21/2022    4:34 PM  [place] in the past year? 0 0 0 0 1  Was there an injury with Fall? 0 0 0 0 0  Fall Risk Category Calculator 0 0 0 0 1  Fall Risk Category _1   Patient Fall Risk Level _2   Patient at Risk for Falls Due to No Fall Risks No Fall Risks No Fall Risks  Impaired balance/gait  Fall risk Follow up Falls evaluation completed Falls evaluation completed  Falls evaluation completed Falls evaluation completed      Impression and Plan:  Welcome to Medicare preventive visit  Screening for osteoporosis  Encounter for osteoporosis screening in asymptomatic postmenopausal patient - Plan: DG Bone Density  Hyperlipidemia, unspecified hyperlipidemia type - Plan: Lipid panel  Chronic kidney disease (CKD), stage IV (severe) (HCC)  Venous stasis of lower extremity  Hypertension associated with diabetes ([place])  Diabetic polyneuropathy associated with type 2 diabetes mellitus ([place]) - Plan: CBC with Differential/Platelet, Comprehensive metabolic panel, Microalbumin / creatinine urine ratio  Morbid obesity ([place])  Acute on chronic diastolic heart failure ([place])  -Recommend routine eye and dental care. -Immunizations: PCV 20 in office today, advised Tdap at pharmacy, declines [place] despite counseling -Healthy lifestyle discussed in detail. -Labs to be updated today. -Colon cancer screening: 01/2020 -Breast cancer  screening: 02/2021 -Cervical cancer screening: 05/2020 -Lung cancer screening: Not applicable -Prostate cancer screening: Not applicable -DEXA: Ordered     Thomasene Dubow Isaac Bliss, MD [facility] at [facility]

## 2022-02-26 ENCOUNTER — Encounter: Payer: Self-pay | Admitting: Internal Medicine

## 2022-02-26 ENCOUNTER — Other Ambulatory Visit: Payer: Self-pay | Admitting: Internal Medicine

## 2022-02-26 ENCOUNTER — Other Ambulatory Visit (INDEPENDENT_AMBULATORY_CARE_PROVIDER_SITE_OTHER): Payer: Medicare Other

## 2022-02-26 DIAGNOSIS — E1142 Type 2 diabetes mellitus with diabetic polyneuropathy: Secondary | ICD-10-CM

## 2022-02-26 DIAGNOSIS — E785 Hyperlipidemia, unspecified: Secondary | ICD-10-CM

## 2022-02-26 LAB — CBC WITH DIFFERENTIAL/PLATELET
Basophils Absolute: 0 10*3/uL (ref 0.0–0.1)
Basophils Relative: 0.5 % (ref 0.0–3.0)
Eosinophils Absolute: 0.6 10*3/uL (ref 0.0–0.7)
Eosinophils Relative: 7.4 % — ABNORMAL HIGH (ref 0.0–5.0)
HCT: 35.9 % — ABNORMAL LOW (ref 36.0–46.0)
Hemoglobin: 11.7 g/dL — ABNORMAL LOW (ref 12.0–15.0)
Lymphocytes Relative: 28.1 % (ref 12.0–46.0)
Lymphs Abs: 2.2 10*3/uL (ref 0.7–4.0)
MCHC: 32.6 g/dL (ref 30.0–36.0)
MCV: 85 fl (ref 78.0–100.0)
Monocytes Absolute: 0.9 10*3/uL (ref 0.1–1.0)
Monocytes Relative: 11.9 % (ref 3.0–12.0)
Neutro Abs: 4.1 10*3/uL (ref 1.4–7.7)
Neutrophils Relative %: 52.1 % (ref 43.0–77.0)
Platelets: 277 10*3/uL (ref 150.0–400.0)
RBC: 4.22 Mil/uL (ref 3.87–5.11)
RDW: 13.9 % (ref 11.5–15.5)
WBC: 7.9 10*3/uL (ref 4.0–10.5)

## 2022-02-26 LAB — LIPID PANEL
Cholesterol: 125 mg/dL (ref 0–200)
HDL: 45.5 mg/dL (ref >39.00)
LDL Cholesterol: 66 mg/dL (ref 0–99)
NonHDL: 79.42
Total CHOL/HDL Ratio: 3
Triglycerides: 69 mg/dL (ref 0.0–149.0)
VLDL: 13.8 mg/dL (ref 0.0–40.0)

## 2022-02-26 LAB — MICROALBUMIN / CREATININE URINE RATIO
Creatinine,U: 166.1 mg/dL
Microalb Creat Ratio: 1.3 mg/g (ref 0.0–30.0)
Microalb, Ur: 2.1 mg/dL — ABNORMAL HIGH (ref 0.0–1.9)

## 2022-02-26 LAB — COMPREHENSIVE METABOLIC PANEL
ALT: 17 U/L (ref 0–35)
AST: 15 U/L (ref 0–37)
Albumin: 3.7 g/dL (ref 3.5–5.2)
Alkaline Phosphatase: 97 U/L (ref 39–117)
BUN: 16 mg/dL (ref 6–23)
CO2: 27 mEq/L (ref 19–32)
Calcium: 8.8 mg/dL (ref 8.4–10.5)
Chloride: 106 mEq/L (ref 96–112)
Creatinine, Ser: 1.57 mg/dL — ABNORMAL HIGH (ref 0.40–1.20)
GFR: 34.23 mL/min — ABNORMAL LOW (ref >60.00)
Glucose, Bld: 122 mg/dL — ABNORMAL HIGH (ref 70–99)
Potassium: 4.4 mEq/L (ref 3.5–5.1)
Sodium: 140 mEq/L (ref 135–145)
Total Bilirubin: 0.4 mg/dL (ref 0.2–1.2)
Total Protein: 7.3 g/dL (ref 6.0–8.3)

## 2022-03-14 ENCOUNTER — Encounter: Payer: Self-pay | Admitting: Internal Medicine

## 2022-03-14 ENCOUNTER — Ambulatory Visit (INDEPENDENT_AMBULATORY_CARE_PROVIDER_SITE_OTHER): Payer: Medicare Other | Admitting: Internal Medicine

## 2022-03-14 VITALS — BP 161/60 | HR 90 | Temp 99.0°F | Wt 295.0 lb

## 2022-03-14 DIAGNOSIS — R059 Cough, unspecified: Secondary | ICD-10-CM

## 2022-03-14 DIAGNOSIS — R52 Pain, unspecified: Secondary | ICD-10-CM

## 2022-03-14 DIAGNOSIS — J069 Acute upper respiratory infection, unspecified: Secondary | ICD-10-CM

## 2022-03-14 LAB — POC COVID19 BINAXNOW: SARS Coronavirus 2 Ag: NEGATIVE

## 2022-03-14 LAB — POCT INFLUENZA A/B
Influenza A, POC: NEGATIVE
Influenza B, POC: NEGATIVE

## 2022-03-14 NOTE — Progress Notes (Signed)
Established Patient Office Visit     CC/Reason for Visit: URI symptoms  HPI: Savannah Crawford is a 67 y.o. female who is coming in today for the above mentioned reasons.  For the past 4 days has been having a sore throat, body aches, cough, sore throat.  She just came back from [place], her granddaughter has been sick.   Past Medical/Surgical History: Past Medical History:  Diagnosis Date   Anemia of chronic disease 11/17/2012   not on meds at this time   At risk for heart disease - +stres test 2012 followed by [facility], [place] with neg cardiac cath 03/2010 (non-obstructive CAD) per review of records, echo 02/2010 normal LVF 01/15/2012   Chronic kidney disease (CKD), stage IV (severe) ([place]) 01/09/2012   Diabetes mellitus with renal manifestations, uncontrolled    on meds   Diabetic neuropathy ([place]) 25/95/6387   Diastolic heart failure - stage 1 per review of echo results from 02/2010 01/15/2012   GERD (gastroesophageal reflux disease)    OTC meds for tx   Hyperlipidemia    on meds   Hypertension    on meds   Obesity    Peripheral arterial disease? Eval by vascular 2013 with no LE PAD and advised to stop pletal    Severe obesity (BMI >= 40) ([place]) 11/13/2012   Toe ulcer, right (HCC)    Ulcer of toe of left foot ([place])    treated by [facility] per patient   Venous stasis of lower extremity 03/10/2012    Past Surgical History:  Procedure Laterality Date   COLONOSCOPY  2013/2014   [place]- records not available    REFRACTIVE SURGERY Bilateral    TOE AMPUTATION Right 2008   RIGHT     Social History:  reports that she quit smoking about 23 years ago. Her smoking use included cigarettes. She has never used smokeless tobacco. She reports current alcohol use of about 4.0 standard drinks of alcohol per week. She reports that she does not currently use drugs.  Allergies: No Known Allergies  Family History:  Family History  Problem Relation Age of Onset    Diabetes Mother    Hyperlipidemia Mother    Hypertension Mother    Heart attack Mother    Stroke Mother    Diabetes Sister    Breast cancer Sister 21   Hypertension Brother    Diabetes Sister    Diabetes Sister    Obesity Other    Hypertension Other    Hyperlipidemia Other    Stroke Other    Heart attack Other    Colon polyps Neg Hx    Colon cancer Neg Hx    Esophageal cancer Neg Hx    Rectal cancer Neg Hx    Stomach cancer Neg Hx      Current Outpatient Medications:    Accu-Chek Softclix Lancets lancets, Use as instructed to check blood sugar 4 times daily, Disp: 400 each, Rfl: 3   atorvastatin (LIPITOR) 20 MG tablet, TAKE 1 TABLET BY MOUTH EVERY DAY, Disp: 90 tablet, Rfl: 0   Blood Glucose Monitoring Suppl (ACCU-CHEK GUIDE ME) w/Device KIT, Use as instructed to check blood sugar 4 times daily, Disp: 1 kit, Rfl: 0   Continuous Blood Gluc Sensor (DEXCOM G7 SENSOR) MISC, Use as instructed to check blood sugar. Change every 10 days, Disp: 9 each, Rfl: 3   furosemide (LASIX) 40 MG tablet, TAKE 1 TABLET BY MOUTH TWICE A DAY, Disp:  180 tablet, Rfl: 1   glucose blood (ACCU-CHEK GUIDE) test strip, Use as instructed to check blood sugar 4 times daily, Disp: 400 each, Rfl: 3   insulin aspart (NOVOLOG FLEXPEN) 100 UNIT/ML FlexPen, INJECT UP TO 90 UNITS DAILY AS INSTRUCTED., Disp: 75 mL, Rfl: 1   Insulin Glargine (BASAGLAR KWIKPEN) 100 UNIT/ML, INJECT UP TO 80 UNITS A DAY AT BEDTIME, Disp: 75 mL, Rfl: 0   Insulin Pen Needle (B-D UF III MINI PEN NEEDLES) 31G X 5 MM MISC, USE AS DIRECTED - 5X A DAY., Disp: 100 each, Rfl: 17   metoprolol succinate (TOPROL-XL) 25 MG 24 hr tablet, TAKE 1 TABLET BY MOUTH EVERY DAY, Disp: 90 tablet, Rfl: 1   mupirocin ointment (BACTROBAN) 2 %, SMARTSIG:1 Application Topical 2-3 Times Daily, Disp: , Rfl:    Semaglutide, 1 MG/DOSE, (OZEMPIC, 1 MG/DOSE,) 4 MG/3ML SOPN, Inject 1 mg into the skin once a week., Disp: 9 mL, Rfl: 3   Vitamin D, Ergocalciferol, (DRISDOL)  1.25 MG (50000 UNIT) CAPS capsule, Take 1 capsule by mouth once a week., Disp: , Rfl:   Review of Systems:  Negative unless indicated in HPI.   Physical Exam: Vitals:   03/14/22 1134 03/14/22 1135  BP: (!) 156/66 (!) 161/60  Pulse: 90   Temp: 99 F (37.2 C)   TempSrc: Oral   SpO2: 95%   Weight: 295 lb (133.8 kg)     Body mass index is 42.33 kg/m.   Physical Exam Vitals reviewed.  Constitutional:      Appearance: Normal appearance.  HENT:     Right Ear: Tympanic membrane, ear canal and external ear normal.     Left Ear: Tympanic membrane, ear canal and external ear normal.     Mouth/Throat:     Mouth: Mucous membranes are moist.     Pharynx: Posterior oropharyngeal erythema present.  Eyes:     Conjunctiva/sclera: Conjunctivae normal.     Pupils: Pupils are equal, round, and reactive to light.  Cardiovascular:     Rate and Rhythm: Normal rate and regular rhythm.  Pulmonary:     Effort: Pulmonary effort is normal.     Breath sounds: Normal breath sounds.  Neurological:     Mental Status: She is alert.      Impression and Plan:  Cough, unspecified type - Plan: POC COVID-19 BinaxNow, POC Influenza A/B  Body aches - Plan: POC COVID-19 BinaxNow, POC Influenza A/B  Viral upper respiratory tract infection  -In office flu and COVID tests are negative. -Given exam findings, PNA, pharyngitis, ear infection are not likely, hence abx have not been prescribed. -Have advised rest, fluids, OTC antihistamines, cough suppressants and mucinex. -RTC if no improvement in 10-14 days. -Work note provided.   Time spent:22 minutes reviewing chart, interviewing and examining patient and formulating plan of care.     Lelon Frohlich, MD [facility] at [facility]

## 2022-03-17 ENCOUNTER — Other Ambulatory Visit: Payer: Self-pay | Admitting: Internal Medicine

## 2022-03-17 DIAGNOSIS — N183 Chronic kidney disease, stage 3 unspecified: Secondary | ICD-10-CM

## 2022-03-21 ENCOUNTER — Other Ambulatory Visit: Payer: Self-pay | Admitting: Internal Medicine

## 2022-03-21 DIAGNOSIS — E1122 Type 2 diabetes mellitus with diabetic chronic kidney disease: Secondary | ICD-10-CM

## 2022-03-21 MED ORDER — NOVOLOG FLEXPEN 100 UNIT/ML ~~LOC~~ SOPN: PEN_INJECTOR | SUBCUTANEOUS | 1 refills | Status: DC

## 2022-03-21 NOTE — Addendum Note (Signed)
Addended by: Lauralyn Primes on: 03/21/2022 01:13 PM   Modules accepted: Orders

## 2022-04-19 ENCOUNTER — Other Ambulatory Visit: Payer: Medicare Other

## 2022-04-19 ENCOUNTER — Other Ambulatory Visit: Payer: Self-pay | Admitting: Internal Medicine

## 2022-04-19 DIAGNOSIS — Z13820 Encounter for screening for osteoporosis: Secondary | ICD-10-CM

## 2022-04-26 ENCOUNTER — Ambulatory Visit (INDEPENDENT_AMBULATORY_CARE_PROVIDER_SITE_OTHER): Payer: Medicare Other | Admitting: Internal Medicine

## 2022-04-26 ENCOUNTER — Encounter: Payer: Self-pay | Admitting: Internal Medicine

## 2022-04-26 VITALS — BP 128/76 | HR 76 | Ht 70.0 in | Wt 297.2 lb

## 2022-04-26 DIAGNOSIS — E049 Nontoxic goiter, unspecified: Secondary | ICD-10-CM

## 2022-04-26 DIAGNOSIS — E1159 Type 2 diabetes mellitus with other circulatory complications: Secondary | ICD-10-CM | POA: Diagnosis not present

## 2022-04-26 DIAGNOSIS — E1169 Type 2 diabetes mellitus with other specified complication: Secondary | ICD-10-CM | POA: Diagnosis not present

## 2022-04-26 DIAGNOSIS — E1165 Type 2 diabetes mellitus with hyperglycemia: Secondary | ICD-10-CM | POA: Diagnosis not present

## 2022-04-26 DIAGNOSIS — E785 Hyperlipidemia, unspecified: Secondary | ICD-10-CM

## 2022-04-26 LAB — POCT GLYCOSYLATED HEMOGLOBIN (HGB A1C): Hemoglobin A1C: 7.5 % — AB (ref 4.0–5.6)

## 2022-04-26 NOTE — Progress Notes (Signed)
Subjective:     Patient ID: Savannah Crawford, female   DOB: [birth year], 67 y.o.   MRN: [id number]  HPI [name] is a 67 y.o. woman returning for f/u for DM2, insulin-dependent, uncontrolled, with complications (CAD, CKD, diabetes neuropathy, diabetic retinopathy, PVD, amputated toe-R). Last visit 4 months ago.  Interim history: No increased urination, blurry vision, nausea, chest pain.   She continues to have leg swelling, which is chronic for her. Wears support hoses. Also has knee swelling.  Reviewed HbA1c levels: Lab Results  Component Value Date   HGBA1C 7.1 (A) 01/15/2022   HGBA1C 7.6 (A) 05/05/2021   HGBA1C 8.6 (A) 01/06/2021  09/14/2021: HbA1c 7.9%  She is on: - Basaglar 40 units in a.m. and 30-40 units at night  - Novolog dose: (15-25) - (15-20) - (15-20) units before meals (but forgets to take it 15 minutes before meals) >>  - Novolog Sliding scale: - 150- 165: + 1 unit  - 166- 180: + 2 units  - 181- 195: + 3 units  - 196- 210: + 4 units  - >210: + 5 units  Take NovoLog 15 minutes before every meal. - Ozempic 0.5 mg weekly- started 03/2020 >> 1 mg weekly  I again suggested Ozempic 0.5 mg weekly in the past, also: 11/2018 >> did not start as she did not want to add another med... In 03/2019, she did try to start it but this was expensive. She was on metformin in the past, but stopped 2/2 CKD.  Tried Victoza before >> cannot remember SE.  Checks sugars >4x a day:  Previously,    Lowest: 68 >> .Marland Kitchen.  100 >> 78 Highest sugar 212 >> 178 >> 290 (missed both injection)  Meter: Freestyle Lite  + CKD-sees nephrology: Lab Results  Component Value Date   BUN 16 02/26/2022   CREATININE 1.57 (H) 02/26/2022  On lisinopril 10.  + HL; Latest lipids: Lab Results  Component Value Date   CHOL 125 02/26/2022   HDL 45.50 02/26/2022   LDLCALC 66 02/26/2022   TRIG 69.0 02/26/2022   CHOLHDL 3 02/26/2022  On Lipitor 20.  Last eye exam 09/14/2021: + DR, + cataract  reportedly.   + Stable numbness and tingling in her feet.  She sees podiatry (Dr. Melony Overly).  She has a history of left third toe ulcer. Last podiatry visit 03/2021.  She has a history of skin graft fourth toe ulcer. Appt coming up.  She has a history of nonobstructive CAD, with positive stress test but a negative cardiac cath 2012, and also has diastolic heart failure. She has HTN, venous stasis, vitamin D deficiency.  She denies family history of medullary thyroid cancer and no personal history of pancreatitis.  Review of Systems + see HPI  I reviewed pt's medications, allergies, PMH, social hx, family hx, and changes were documented in the history of present illness. Otherwise, unchanged from my initial visit note.  Past Medical History:  Diagnosis Date   Anemia of chronic disease 11/17/2012   not on meds at this time   At risk for heart disease - +stres test 2012 followed by [facility], [place] with neg cardiac cath 03/2010 (non-obstructive CAD) per review of records, echo 02/2010 normal LVF 01/15/2012   Chronic kidney disease (CKD), stage IV (severe) ([place]) 01/09/2012   Diabetes mellitus with renal manifestations, uncontrolled    on meds   Diabetic neuropathy ([place]) [id number]   Diastolic heart failure - stage 1 per review of echo  results from 02/2010 01/15/2012   GERD (gastroesophageal reflux disease)    OTC meds for tx   Hyperlipidemia    on meds   Hypertension    on meds   Obesity    Peripheral arterial disease? Eval by vascular 2013 with no LE PAD and advised to stop pletal    Severe obesity (BMI >= 40) ([place]) 11/13/2012   Toe ulcer, right (HCC)    Ulcer of toe of left foot ([place])    treated by [facility] per patient   Venous stasis of lower extremity 03/10/2012   Past Surgical History:  Procedure Laterality Date   COLONOSCOPY  2013/2014   [place]- records not available    REFRACTIVE SURGERY Bilateral    TOE AMPUTATION Right 2008   RIGHT    Social History   Socioeconomic  History   Marital status: Married    Spouse name: Not on file   Number of children: 2   Years of education: Not on file   Highest education level: GED or equivalent  Occupational History   Not on file  Tobacco Use   Smoking status: Former    Types: Cigarettes    Quit date: 02/20/1999    Years since quitting: 23.1   Smokeless tobacco: Never  Vaping Use   Vaping Use: Never used  Substance and Sexual Activity   Alcohol use: Yes    Alcohol/week: 4.0 standard drinks of alcohol    Types: 4 Standard drinks or equivalent per week    Comment: Occasional Drinks   Drug use: Not Currently   Sexual activity: Not on file  Other Topics Concern   Not on file  Social History Narrative   Are you right handed or left handed? Right Handed    Are you currently employed ? Yes. Work 2-3 days at [employer] is your current occupation?   Do you live at home alone? No   Who lives with you? Husband, daughter, and granddaughter    What type of home do you live in: 1 story or 2 story? One story home        Social Determinants of Health   Financial Resource Strain: Medium Risk (03/13/2022)   Overall Financial Resource Strain (CARDIA)    Difficulty of Paying Living Expenses: Somewhat hard  Food Insecurity: Unknown (03/13/2022)   Hunger Vital Sign    Worried About Running Out of Food in the Last Year: Never true    Ran Out of Food in the Last Year: Not on file  Transportation Needs: Unknown (03/13/2022)   PRAPARE - Hydrologist (Medical): Not on file    Lack of Transportation (Non-Medical): No  Physical Activity: Insufficiently Active (03/13/2022)   Exercise Vital Sign    Days of Exercise per Week: 2 days    Minutes of Exercise per Session: 60 min  Stress: No Stress Concern Present (03/13/2022)   [place]    Feeling of Stress : Only a little  Social Connections: Socially Integrated (03/13/2022)    Social Connection and Isolation Panel [NHANES]    Frequency of Communication with Friends and Family: Once a week    Frequency of Social Gatherings with Friends and Family: Twice a week    Attends Religious Services: More than 4 times per year    Active Member of [employer] or Organizations: Yes    Attends Archivist Meetings: More than 4 times per year  Marital Status: Married  Human resources officer Violence: Not At Risk (05/30/2020)   Humiliation, Afraid, Rape, and Kick questionnaire    Fear of Current or Ex-Partner: No    Emotionally Abused: No    Physically Abused: No    Sexually Abused: No   Current Outpatient Medications on File Prior to Visit  Medication Sig Dispense Refill   Accu-Chek Softclix Lancets lancets Use as instructed to check blood sugar 4 times daily 400 each 3   atorvastatin (LIPITOR) 20 MG tablet TAKE 1 TABLET BY MOUTH EVERY DAY 90 tablet 0   Blood Glucose Monitoring Suppl (ACCU-CHEK GUIDE ME) w/Device KIT Use as instructed to check blood sugar 4 times daily 1 kit 0   Continuous Blood Gluc Sensor (DEXCOM G7 SENSOR) MISC Use as instructed to check blood sugar. Change every 10 days 9 each 3   furosemide (LASIX) 40 MG tablet TAKE 1 TABLET BY MOUTH TWICE A DAY 180 tablet 1   glucose blood (ACCU-CHEK GUIDE) test strip Use as instructed to check blood sugar 4 times daily 400 each 3   insulin aspart (NOVOLOG FLEXPEN) 100 UNIT/ML FlexPen INJECT UP TO 90 UNITS DAILY AS INSTRUCTED 60 mL 1   Insulin Glargine (BASAGLAR KWIKPEN) 100 UNIT/ML INJECT UP TO 80 UNITS A DAY AT BEDTIME 75 mL 0   Insulin Pen Needle (B-D UF III MINI PEN NEEDLES) 31G X 5 MM MISC USE AS DIRECTED - 5X A DAY. 100 each 17   metoprolol succinate (TOPROL-XL) 25 MG 24 hr tablet TAKE 1 TABLET BY MOUTH EVERY DAY 90 tablet 1   mupirocin ointment (BACTROBAN) 2 % SMARTSIG:1 Application Topical 2-3 Times Daily     Semaglutide, 1 MG/DOSE, (OZEMPIC, 1 MG/DOSE,) 4 MG/3ML SOPN Inject 1 mg into the skin once a week. 9 mL 3    Vitamin D, Ergocalciferol, (DRISDOL) 1.25 MG (50000 UNIT) CAPS capsule Take 1 capsule by mouth once a week.     No current facility-administered medications on file prior to visit.   No Known Allergies Family History  Problem Relation Age of Onset   Diabetes Mother    Hyperlipidemia Mother    Hypertension Mother    Heart attack Mother    Stroke Mother    Diabetes Sister    Breast cancer Sister 72   Hypertension Brother    Diabetes Sister    Diabetes Sister    Obesity Other    Hypertension Other    Hyperlipidemia Other    Stroke Other    Heart attack Other    Colon polyps Neg Hx    Colon cancer Neg Hx    Esophageal cancer Neg Hx    Rectal cancer Neg Hx    Stomach cancer Neg Hx    Objective:   Physical Exam BP 128/76 (BP Location: Left Arm, Patient Position: Sitting, Cuff Size: Normal)   Pulse 76   Ht '5\' 10"'$  (1.778 m)   Wt 297 lb 3.2 oz (134.8 kg)   SpO2 97%   BMI 42.64 kg/m   Wt Readings from Last 3 Encounters:  04/26/22 297 lb 3.2 oz (134.8 kg)  03/14/22 295 lb (133.8 kg)  02/22/22 299 lb 1.6 oz (135.7 kg)   Constitutional: overweight, in NAD Eyes:  no exophthalmos ENT:  + thyromegaly, no cervical lymphadenopathy Cardiovascular: RRR, No MRG, + B LE edema Respiratory: CTA B Musculoskeletal: no deformities Skin: moist, warm, no rashes Neurological: no tremor with outstretched hands  Assessment:     1. DM2, insulin-dependent, uncontrolled, with complications -  nonobstructive CAD - CKD - diabetes neuropathy - diabetic retinopathy - PVD - R amputated toe  2. HL  3. Obesity class 3    Plan:     1. Pt with history of uncontrolled type 2 diabetes, on basal-bolus insulin regimen and also weekly GLP-1 receptor agonist with improved control.  At last visit, HbA1c was lower, at 7.1%.  She tolerates Ozempic well, with some degree of constipation.  She declines to try an SGLT2 inhibitor. -At last visit, sugars were fluctuating mostly within the target range  with more pronounced peaks after breakfast and then an occasional peak after dinner.  Upon questioning, she was not taking the insulin before breakfast as she was afraid that she could drop her blood sugars after this meal.  We discussed about trying to take the NovoLog but reduce the dose in the morning and I also advised her to back off the Basaglar doses.  Before dinner, I advised her that with larger meals she could take a higher dose.  We did not change the dose of Ozempic. CGM interpretation: -At today's visit, we reviewed her CGM downloads: It appears that 63% of values are in target range (goal >70%), while 36% are higher than 180 (goal <25%), and 1% are lower than 70 (goal <4%).  The calculated average blood sugar is 163.  The projected HbA1c for the next 3 months ([facility]MI) is 7.2%. -Reviewing the CGM trends, sugars appear to be increasing after the first meal of the day (brunch) and also after a snack at night.  They are dropping it afternoon and upon questioning, this is likely happening due to taking her NovoLog too late.  Also, she is not taking NovoLog before snacks at night as she was not sure which insulin to take.  I advised her to ideally eliminate the late night snack but if she does have it to take 5 to 10 units of NovoLog before this.  Also discussed about setting alarms on her phone to remind her to take the insulin 15 minutes before the meals.  Will continue the rest of the regimen for now. -  I advised her to: Patient Instructions  Please use the following regimen: - Basaglar 35 units in a.m. and 35 units at night - Novolog dose: (15) - (15-20) - (15-25) units 15 min before every meal and 5-10 units before a snack - Ozempic 1 mg weekly  I ordered a thyroid U/S for you.  Please return in 3-4 months.  - we checked her HbA1c: 7.5% (higher) - advised to check sugars at different times of the day - 4x a day, rotating check times - advised for yearly eye exams >> she is UTD - return  to clinic in 3-4 months  2. HL -Reviewed latest lipid panel from 02/2022: LDL above our target of less than 55, otherwise fractions at goal: Lab Results  Component Value Date   CHOL 125 02/26/2022   HDL 45.50 02/26/2022   LDLCALC 66 02/26/2022   TRIG 69.0 02/26/2022   CHOLHDL 3 02/26/2022  -She continues on Lipitor 20 mg daily without side effects  3.  Obesity class III -She continues on Ozempic, we can also help with weight loss -She lost 18 lbs before the last 4 visits combined -Since last visit, she gained 3 pounds.  4.  Goiter -Central lower neck -She remembers having had RSV approximately 1.5 months ago -Possibly inflammatory, but would like to check a thyroid ultrasound -Reviewed latest TSH and this was  normal in 11/2021  Orders Placed This Encounter  Procedures   US THYROID   POCT HgB A1C   Philemon Kingdom, MD PhD [facility]

## 2022-04-26 NOTE — Patient Instructions (Addendum)
Please use the following regimen: - Basaglar 35 units in a.m. and 35 units at night - Novolog dose: (15) - (15-20) - (15-25) units 15 min before every meal and 5-10 units before a snack - Ozempic 1 mg weekly  I ordered a thyroid U/S for you.  Please return in 3-4 months.

## 2022-05-08 ENCOUNTER — Ambulatory Visit
Admission: RE | Admit: 2022-05-08 | Discharge: 2022-05-08 | Disposition: A | Discharge status: Home / Self Care | Payer: Medicare Other | Source: Ambulatory Visit | Attending: Internal Medicine | Admitting: Internal Medicine

## 2022-05-08 DIAGNOSIS — Z13820 Encounter for screening for osteoporosis: Secondary | ICD-10-CM

## 2022-05-10 ENCOUNTER — Ambulatory Visit (INDEPENDENT_AMBULATORY_CARE_PROVIDER_SITE_OTHER): Payer: Medicare Other | Admitting: Family Medicine

## 2022-05-10 ENCOUNTER — Encounter: Payer: Self-pay | Admitting: Family Medicine

## 2022-05-10 VITALS — BP 140/62 | HR 70 | Temp 97.7°F | Ht 70.0 in | Wt 299.0 lb

## 2022-05-10 DIAGNOSIS — L309 Dermatitis, unspecified: Secondary | ICD-10-CM | POA: Diagnosis not present

## 2022-05-10 DIAGNOSIS — I10 Essential (primary) hypertension: Secondary | ICD-10-CM

## 2022-05-10 MED ORDER — TRIAMCINOLONE ACETONIDE 0.025 % EX OINT: 1.0000 | TOPICAL_OINTMENT | Freq: Two times a day (BID) | CUTANEOUS | 0 refills | Status: AC

## 2022-05-10 NOTE — Progress Notes (Signed)
   Established Patient Office Visit   Subjective  Patient ID: Savannah Crawford, female    DOB: [birth year]  Age: 67 y.o. MRN: [id number]  Chief Complaint  Patient presents with   Rash    Patient complains of rash, x5 days, Tried Over the counter cream     Patient is a 67 year old female with pmh sig for DM 2, HTN, CKD 4, heart failure, HLD, obesity, peripheral neuropathy followed by Dr. Jerilee Hoh and seen for acute concern.  Patient endorses rash with pruritic, dry skin, and hives on bilateral hands x 5 days.  Mild improvement in symptoms today.  Patient denies changes in soaps, lotions, detergents, foods.  Patient does not wear gloves when using cleaning products or washing dishes but has not done so recently.  Patient denies rash elsewhere.  Tried Benadryl cream for symptoms.  Rash The affected locations include the left hand, left wrist, right hand and right wrist. The rash is characterized by dryness and itchiness. She was exposed to nothing.      Review of Systems  Skin:  Positive for rash.   Negative unless stated above    Objective:     BP (!) 140/62 (BP Location: Left Arm, Patient Position: Sitting, Cuff Size: Large)   Pulse 70   Temp 97.7 F (36.5 C) (Oral)   Ht 5\' 10"  (1.778 m)   Wt 299 lb (135.6 kg)   SpO2 99%   BMI 42.90 kg/m    Physical Exam Constitutional:      Appearance: Normal appearance.  HENT:     Head: Normocephalic and atraumatic.     Nose: Nose normal.     Mouth/Throat:     Mouth: Mucous membranes are moist.  Eyes:     Extraocular Movements: Extraocular movements intact.     Conjunctiva/sclera: Conjunctivae normal.     Pupils: Pupils are equal, round, and reactive to light.  Cardiovascular:     Rate and Rhythm: Normal rate.  Pulmonary:     Effort: Pulmonary effort is normal.     Breath sounds: Normal breath sounds.  Skin:    General: Skin is warm and dry.     Comments: Dry skin of b/l hands and wrist with excoriation, pinpoint areas  of eschar.  No erythema, lesions.  Webspace between second and third digits of right hand hyperpigmented slightly hypertrophic dry skin, no burrows noted.  Neurological:     Mental Status: She is alert and oriented to person, place, and time.      No results found for any visits on 05/10/22.    Assessment & Plan:  Dermatitis -Discussed possible causes including eczema, contact dermatitis.   -Patient advised to also consider scabies if rash progresses. -Discussed applying gentle unscented moisturizer throughout the day, taking warm not hot showers, and hydration. -Triamcinolone cream -     Triamcinolone Acetonide; Apply 1 Application topically 2 (two) times daily for 7 days.  Dispense: 30 g; Refill: 0  Essential hypertension -Evaded -Continue current medications including Toprol XL 25 mg daily -Lifestyle modifications   Return if symptoms worsen or fail to improve.   Billie Ruddy, MD

## 2022-05-22 ENCOUNTER — Telehealth: Payer: Self-pay | Admitting: Internal Medicine

## 2022-05-22 NOTE — Telephone Encounter (Signed)
Pt is returning Rachel's call.

## 2022-05-22 NOTE — Telephone Encounter (Signed)
Spoke with patient and reviewed bone density result

## 2022-05-22 NOTE — Telephone Encounter (Signed)
Pt called, returning CMA's call. CMA was with a patient Pt asked that CMA call back at her earliest convenience. 

## 2022-05-24 ENCOUNTER — Ambulatory Visit: Payer: Medicare Other | Admitting: Adult Health

## 2022-05-24 ENCOUNTER — Ambulatory Visit
Admission: RE | Admit: 2022-05-24 | Discharge: 2022-05-24 | Disposition: A | Discharge status: Home / Self Care | Payer: Medicare Other | Source: Ambulatory Visit | Attending: Internal Medicine | Admitting: Internal Medicine

## 2022-05-24 DIAGNOSIS — E049 Nontoxic goiter, unspecified: Secondary | ICD-10-CM

## 2022-06-24 ENCOUNTER — Other Ambulatory Visit: Payer: Self-pay | Admitting: Internal Medicine

## 2022-06-24 DIAGNOSIS — I152 Hypertension secondary to endocrine disorders: Secondary | ICD-10-CM

## 2022-06-24 DIAGNOSIS — Z794 Long term (current) use of insulin: Secondary | ICD-10-CM

## 2022-06-24 DIAGNOSIS — E1169 Type 2 diabetes mellitus with other specified complication: Secondary | ICD-10-CM

## 2022-06-24 DIAGNOSIS — E1122 Type 2 diabetes mellitus with diabetic chronic kidney disease: Secondary | ICD-10-CM

## 2022-06-25 ENCOUNTER — Other Ambulatory Visit: Payer: Self-pay | Admitting: Internal Medicine

## 2022-06-25 DIAGNOSIS — E1122 Type 2 diabetes mellitus with diabetic chronic kidney disease: Secondary | ICD-10-CM

## 2022-07-21 ENCOUNTER — Other Ambulatory Visit: Payer: Self-pay | Admitting: Internal Medicine

## 2022-07-21 DIAGNOSIS — I152 Hypertension secondary to endocrine disorders: Secondary | ICD-10-CM

## 2022-08-27 ENCOUNTER — Ambulatory Visit: Payer: Medicare Other | Admitting: Internal Medicine

## 2022-09-03 ENCOUNTER — Ambulatory Visit: Payer: Medicare Other | Admitting: Internal Medicine

## 2022-09-19 ENCOUNTER — Other Ambulatory Visit: Payer: Self-pay | Admitting: Internal Medicine

## 2022-09-19 DIAGNOSIS — E1165 Type 2 diabetes mellitus with hyperglycemia: Secondary | ICD-10-CM

## 2022-09-25 ENCOUNTER — Other Ambulatory Visit: Payer: Self-pay | Admitting: Internal Medicine

## 2022-09-25 DIAGNOSIS — E1169 Type 2 diabetes mellitus with other specified complication: Secondary | ICD-10-CM

## 2022-09-30 ENCOUNTER — Other Ambulatory Visit: Payer: Self-pay | Admitting: Internal Medicine

## 2022-09-30 DIAGNOSIS — N183 Chronic kidney disease, stage 3 unspecified: Secondary | ICD-10-CM

## 2022-09-30 DIAGNOSIS — E1159 Type 2 diabetes mellitus with other circulatory complications: Secondary | ICD-10-CM

## 2022-10-01 ENCOUNTER — Other Ambulatory Visit: Payer: Self-pay

## 2022-10-01 DIAGNOSIS — N183 Chronic kidney disease, stage 3 unspecified: Secondary | ICD-10-CM

## 2022-10-01 MED ORDER — BD PEN NEEDLE MINI U/F 31G X 5 MM MISC
3 refills | Status: AC
Start: 2022-10-01 — End: ?

## 2022-10-03 NOTE — Telephone Encounter (Signed)
Pt called to request a 3 month supply of this Rx.  Pt was informed MD is OOO this week.   Pt asked if another provider can assist?  [id number] - [place], [place] - [address] Phone: [phone]  Fax: [phone]

## 2022-10-04 NOTE — Telephone Encounter (Signed)
Pt is growing more upset and concerned that she has yet to receive her refill.  Pt was reminded that MD is OOO.   Please advise.

## 2022-10-08 NOTE — Telephone Encounter (Signed)
Pt checking on progress of refill and requesting refill of metoprolol succinate (TOPROL-XL) 25 MG 24 hr tablet

## 2022-10-09 ENCOUNTER — Other Ambulatory Visit: Payer: Self-pay | Admitting: Internal Medicine

## 2022-10-10 NOTE — Telephone Encounter (Addendum)
Pt called, very upset because she still has not received her refills.  Pt asked if MD was "still in business"? Pt was informed that yes, MD is still in business, as she is still an MD at this location. Pt asked if she needed to get a new MD or what, because it doesn't look like it.  Pt was informed her message would be sent back high priority, since she is completely out. Pt said we all keep telling her that, but she is still waiting.  Pt said just "tell her to send my refills", and hung up.

## 2022-10-11 NOTE — Telephone Encounter (Signed)
Pt calling again to check on progress of refill for metoprolol succinate (TOPROL-XL) 25 MG 24 hr tablet and lisinopril (ZESTRIL) 10 MG tablet.  Nurse performing home care got on the line and reported patient current blood pressuer 168/90. Says her heart rate is 110

## 2022-10-13 ENCOUNTER — Other Ambulatory Visit: Payer: Self-pay | Admitting: Internal Medicine

## 2022-10-13 DIAGNOSIS — I152 Hypertension secondary to endocrine disorders: Secondary | ICD-10-CM

## 2022-10-15 ENCOUNTER — Telehealth: Payer: Self-pay | Admitting: Internal Medicine

## 2022-10-15 NOTE — Telephone Encounter (Signed)
Savannah Crawford called inquiring as to why her  Lisinopril 10mg  tablets have not been refilled.  I was unable to locate this on her medication list, and patient states she has been taking this for years.  Please advise patient of status, [phone].

## 2022-10-16 NOTE — Telephone Encounter (Signed)
Spoke with patient and she has been taking Lisinopril 10 mg.   Refill sent and medication list updated.

## 2022-10-30 LAB — LAB REPORT - SCANNED
Albumin, Urine POC: 3.6
Albumin/Creatinine Ratio, Urine, POC: 4
Creatinine, POC: 85 mg/dL
EGFR: 35

## 2022-11-22 ENCOUNTER — Ambulatory Visit: Payer: Medicare Other | Admitting: Internal Medicine

## 2022-11-29 ENCOUNTER — Encounter: Payer: Self-pay | Admitting: Internal Medicine

## 2022-11-29 ENCOUNTER — Ambulatory Visit: Payer: Medicare Other | Admitting: Internal Medicine

## 2022-11-29 VITALS — BP 144/60 | HR 79 | Temp 97.6°F | Ht 70.0 in | Wt 297.5 lb

## 2022-11-29 DIAGNOSIS — Z794 Long term (current) use of insulin: Secondary | ICD-10-CM

## 2022-11-29 DIAGNOSIS — E1122 Type 2 diabetes mellitus with diabetic chronic kidney disease: Secondary | ICD-10-CM

## 2022-11-29 DIAGNOSIS — I152 Hypertension secondary to endocrine disorders: Secondary | ICD-10-CM

## 2022-11-29 DIAGNOSIS — I739 Peripheral vascular disease, unspecified: Secondary | ICD-10-CM

## 2022-11-29 DIAGNOSIS — Z23 Encounter for immunization: Secondary | ICD-10-CM

## 2022-11-29 DIAGNOSIS — N1832 Chronic kidney disease, stage 3b: Secondary | ICD-10-CM

## 2022-11-29 DIAGNOSIS — E1169 Type 2 diabetes mellitus with other specified complication: Secondary | ICD-10-CM | POA: Diagnosis not present

## 2022-11-29 DIAGNOSIS — E1159 Type 2 diabetes mellitus with other circulatory complications: Secondary | ICD-10-CM | POA: Diagnosis not present

## 2022-11-29 DIAGNOSIS — E785 Hyperlipidemia, unspecified: Secondary | ICD-10-CM | POA: Diagnosis not present

## 2022-11-29 DIAGNOSIS — N184 Chronic kidney disease, stage 4 (severe): Secondary | ICD-10-CM

## 2022-11-29 DIAGNOSIS — I5033 Acute on chronic diastolic (congestive) heart failure: Secondary | ICD-10-CM

## 2022-11-29 NOTE — Progress Notes (Signed)
Established Patient Office Visit     CC/Reason for Visit: Follow-up chronic conditions  HPI: Savannah Crawford is a 68 y.o. female who is coming in today for the above mentioned reasons. Past Medical History is significant for: Insulin-dependent diabetes, stage IV chronic kidney disease, hypertension, hyperlipidemia, morbid obesity, PAD, heart failure with preserved ejection fraction.  She has been doing well.  In July she developed a right third toe infection and has been following with podiatry.  She had an MRI today with results pending.  She is describing pain and tiredness in her legs after minimal walking which resolves with rest.  Requesting flu vaccine.   Past Medical/Surgical History: Past Medical History:  Diagnosis Date   Anemia of chronic disease 11/17/2012   not on meds at this time   At risk for heart disease - +stres test 2012 followed by [facility], [place] with neg cardiac cath 03/2010 (non-obstructive CAD) per review of records, echo 02/2010 normal LVF 01/15/2012   Chronic kidney disease (CKD), stage IV (severe) (HCC) 01/09/2012   Diabetes mellitus with renal manifestations, uncontrolled    on meds   Diabetic neuropathy (HCC) 01/09/2012   Diastolic heart failure - stage 1 per review of echo results from 02/2010 01/15/2012   GERD (gastroesophageal reflux disease)    OTC meds for tx   Hyperlipidemia    on meds   Hypertension    on meds   Obesity    Peripheral arterial disease? Eval by vascular 2013 with no LE PAD and advised to stop pletal    Severe obesity (BMI >= 40) (HCC) 11/13/2012   Toe ulcer, right (HCC)    Ulcer of toe of left foot (HCC)    treated by [facility] per patient   Venous stasis of lower extremity 03/10/2012    Past Surgical History:  Procedure Laterality Date   COL[place]OSCOPY  2013/2014   [place]- records not available    REFRACTIVE SURGERY Bilateral    TOE AMPUTATION Right 2008   RIGHT     Social History:  reports that she  quit smoking about 23 years ago. Her smoking use included cigarettes. She has never used smokeless tobacco. She reports current alcohol use of about 4.0 standard drinks of alcohol per week. She reports that she does not currently use drugs.  Allergies: No Known Allergies  Family History:  Family History  Problem Relation Age of Onset   Diabetes Mother    Hyperlipidemia Mother    Hypertension Mother    Heart attack Mother    Stroke Mother    Diabetes Sister    Breast cancer Sister 31   Hypertension Brother    Diabetes Sister    Diabetes Sister    Obesity Other    Hypertension Other    Hyperlipidemia Other    Stroke Other    Heart attack Other    Colon polyps Neg Hx    Colon cancer Neg Hx    Esophageal cancer Neg Hx    Rectal cancer Neg Hx    Stomach cancer Neg Hx      Current Outpatient Medications:    atorvastatin (LIPITOR) 20 MG tablet, TAKE 1 TABLET BY MOUTH EVERY DAY, Disp: 90 tablet, Rfl: 1   Blood Glucose Monitoring Suppl (ACCU-CHEK GUIDE ME) w/Device KIT, Use as instructed to check blood sugar 4 times daily, Disp: 1 kit, Rfl: 0   Continuous Glucose Sensor (DEXCOM G7 SENSOR) MISC, USE AS INSTRUCTED TO CHECK BLOOD SUGAR.  CHANGE EVERY 10 DAYS, Disp: 3 each, Rfl: 6   furosemide (LASIX) 40 MG tablet, TAKE 1 TABLET BY MOUTH TWICE A DAY, Disp: 180 tablet, Rfl: 1   insulin aspart (NOVOLOG FLEXPEN) 100 UNIT/ML FlexPen, INJECT UP TO 90 UNITS DAILY AS INSTRUCTED, Disp: 60 mL, Rfl: 1   insulin degludec (TRESIBA FLEXTOUCH) 100 UNIT/ML FlexTouch Pen, INJECT UP TO 80 UNITS A DAY AT BEDTIME, Disp: 75 mL, Rfl: 0   Insulin Pen Needle (B-D UF III MINI PEN NEEDLES) 31G X 5 MM MISC, Use to inject medication 5 times a day, Disp: 1000 each, Rfl: 3   lisinopril (ZESTRIL) 10 MG tablet, TAKE 1 TABLET BY MOUTH EVERY DAY, Disp: 90 tablet, Rfl: 1   metoprolol succinate (TOPROL-XL) 25 MG 24 hr tablet, TAKE 1 TABLET BY MOUTH EVERY DAY, Disp: 90 tablet, Rfl: 1   mupirocin ointment (BACTROBAN) 2 %,  SMARTSIG:1 Application Topical 2-3 Times Daily, Disp: , Rfl:    Semaglutide, 1 MG/DOSE, (OZEMPIC, 1 MG/DOSE,) 4 MG/3ML SOPN, INJECT 1MG  INTO THE SKIN ONCE A WEEK, Disp: 3 mL, Rfl: 3   Vitamin D, Ergocalciferol, (DRISDOL) 1.25 MG (50000 UNIT) CAPS capsule, Take 1 capsule by mouth once a week., Disp: , Rfl:   Review of Systems:  Negative unless indicated in HPI.   Physical Exam: Vitals:   11/29/22 1323  BP: (!) 144/60  Pulse: 79  Temp: 97.6 F (36.4 C)  TempSrc: Oral  SpO2: 99%  Weight: 297 lb 8 oz (134.9 kg)  Height: 5\' 10"  (1.778 m)    Body mass index is 42.69 kg/m.   Physical Exam Vitals reviewed.  Constitutional:      Appearance: Normal appearance.  HENT:     Head: Normocephalic and atraumatic.  Eyes:     Conjunctiva/sclera: Conjunctivae normal.     Pupils: Pupils are equal, round, and reactive to light.  Cardiovascular:     Rate and Rhythm: Normal rate and regular rhythm.  Pulmonary:     Effort: Pulmonary effort is normal.     Breath sounds: Normal breath sounds.  Skin:    General: Skin is warm and dry.  Neurological:     General: No focal deficit present.     Mental Status: She is alert and oriented to person, place, and time.  Psychiatric:        Mood and Affect: Mood normal.        Behavior: Behavior normal.        Thought Content: Thought content normal.        Judgment: Judgment normal.      Impression and Plan:  Hyperlipidemia, unspecified hyperlipidemia type  Chronic kidney disease (CKD), stage IV (severe) (HCC)  Hyperlipidemia associated with type 2 diabetes mellitus (HCC)  Hypertension associated with diabetes (HCC)  Type 2 diabetes mellitus with stage 3b chronic kidney disease, with long-term current use of insulin (HCC)  Acute on chronic diastolic heart failure (HCC)  Morbid obesity (HCC)  PAD (peripheral artery disease) (HCC) -     VAS [place] ABI WITH/WO TBI; Future  Immunization due  Need for influenza vaccination -     Flu Vaccine  Trivalent High Dose (Fluad)   -Flu vaccine administered today. -Blood pressure is fairly well-controlled. -She follows with endocrinology in regards to her diabetes. -She follows with nephrology, I will obtain records. -She had an MRI today ordered by her podiatrist for a right third toe infection.  Results are pending.  Since she is describing claudication symptoms I will go ahead  and order ABIs.  Time spent:33 minutes reviewing chart, interviewing and examining patient and formulating plan of care.     Chaya Jan, MD [facility] at [facility]

## 2023-01-23 ENCOUNTER — Other Ambulatory Visit: Payer: Self-pay | Admitting: Internal Medicine

## 2023-01-23 DIAGNOSIS — E1165 Type 2 diabetes mellitus with hyperglycemia: Secondary | ICD-10-CM

## 2023-01-23 DIAGNOSIS — E1159 Type 2 diabetes mellitus with other circulatory complications: Secondary | ICD-10-CM

## 2023-01-31 ENCOUNTER — Encounter: Payer: Self-pay | Admitting: Internal Medicine

## 2023-01-31 ENCOUNTER — Other Ambulatory Visit: Payer: Self-pay | Admitting: Internal Medicine

## 2023-01-31 ENCOUNTER — Ambulatory Visit: Payer: Medicare Other | Admitting: Internal Medicine

## 2023-01-31 VITALS — BP 130/70 | HR 72 | Ht 70.0 in | Wt 296.2 lb

## 2023-01-31 DIAGNOSIS — E785 Hyperlipidemia, unspecified: Secondary | ICD-10-CM

## 2023-01-31 DIAGNOSIS — Z794 Long term (current) use of insulin: Secondary | ICD-10-CM

## 2023-01-31 DIAGNOSIS — E1159 Type 2 diabetes mellitus with other circulatory complications: Secondary | ICD-10-CM

## 2023-01-31 DIAGNOSIS — E1165 Type 2 diabetes mellitus with hyperglycemia: Secondary | ICD-10-CM | POA: Diagnosis not present

## 2023-01-31 DIAGNOSIS — E1169 Type 2 diabetes mellitus with other specified complication: Secondary | ICD-10-CM | POA: Diagnosis not present

## 2023-01-31 DIAGNOSIS — E1122 Type 2 diabetes mellitus with diabetic chronic kidney disease: Secondary | ICD-10-CM

## 2023-01-31 DIAGNOSIS — N183 Chronic kidney disease, stage 3 unspecified: Secondary | ICD-10-CM

## 2023-01-31 LAB — POCT GLYCOSYLATED HEMOGLOBIN (HGB A1C): Hemoglobin A1C: 8 % — AB (ref 4.0–5.6)

## 2023-01-31 MED ORDER — OZEMPIC (1 MG/DOSE) 4 MG/3ML ~~LOC~~ SOPN
PEN_INJECTOR | SUBCUTANEOUS | 3 refills | Status: DC
Start: 2023-01-31 — End: 2023-11-07

## 2023-01-31 MED ORDER — NOVOLOG FLEXPEN 100 UNIT/ML ~~LOC~~ SOPN: PEN_INJECTOR | SUBCUTANEOUS | 3 refills | Status: AC

## 2023-01-31 MED ORDER — DEXCOM G7 SENSOR MISC
3 refills | Status: AC
Start: 2023-01-31 — End: ?

## 2023-01-31 MED ORDER — BASAGLAR KWIKPEN 100 UNIT/ML ~~LOC~~ SOPN: 20.0000 [IU] | PEN_INJECTOR | Freq: Two times a day (BID) | SUBCUTANEOUS | 3 refills | Status: DC

## 2023-01-31 NOTE — Patient Instructions (Addendum)
Please use the following regimen: - Basaglar 20 units in a.m. and 20 units at night - Novolog dose: 20-25 units 15 min before every meal and 5-10 units before a snack - Ozempic 1 mg weekly  Please return in 3 months.

## 2023-01-31 NOTE — Progress Notes (Signed)
Subjective:     Patient ID: Savannah Crawford, female   DOB: 26-Sep-1955, 67 y.o.   MRN: [id number]  HPI [name] is a 67 y.o. woman returning for f/u for DM2, insulin-dependent, uncontrolled, with complications (CAD, CKD, diabetes neuropathy, diabetic retinopathy, PVD, amputated toe-R). Last visit 9 months ago.  Interim history: No increased urination, blurry vision, nausea, chest pain.   She continues to have leg swelling, which is chronic.  Reviewed HbA1c levels: Lab Results  Component Value Date   HGBA1C 7.5 (A) 04/26/2022   HGBA1C 7.1 (A) 01/15/2022   HGBA1C 7.6 (A) 05/05/2021  09/14/2021: HbA1c 7.9%  She is on: - Basaglar 40 units in a.m. and 30-40 units at night  >> 25 units 2x a day - Novolog dose: (15-25) - (15-20) - (15-20) units before meals (but forgets to take it 15 minutes before meals)  >> 20-25 units before meals - Novolog Sliding scale: - 150- 165: + 1 unit  - 166- 180: + 2 units  - 181- 195: + 3 units  - 196- 210: + 4 units  - >210: + 5 units  Take NovoLog 15 minutes before every meal. - Ozempic 0.5 mg weekly- started 03/2020 >> 1 mg weekly  I again suggested Ozempic 0.5 mg weekly in the past, also: 11/2018 >> did not start as she did not want to add another med... In 03/2019, she did try to start it but this was expensive. She was on metformin in the past, but stopped 2/2 CKD.  Tried Victoza before >> cannot remember SE.  Checks sugars >4x a day with the CGM (with Receiver):  Previously:  Previously,    Lowest: 68 >> .Marland Kitchen.  100 >> 78 >> 64. Highest sugar 212 >> 178 >> 290 (missed ins.) >> 284.  Meter: Freestyle Lite  + CKD-sees nephrology: 10/29/2022: ACR 4  Lab Results  Component Value Date   BUN 16 02/26/2022   CREATININE 1.57 (H) 02/26/2022   Lab Results  Component Value Date   MICRALBCREAT 4 10/30/2022   MICRALBCREAT 1.3 02/26/2022   MICRALBCREAT 0.6 08/29/2021   MICRALBCREAT 2.4 09/14/2013   MICRALBCREAT 2.9 11/13/2012   MICRALBCREAT  3.2 07/08/2012  On lisinopril 10.  + HL; Latest lipids: Lab Results  Component Value Date   CHOL 125 02/26/2022   HDL 45.50 02/26/2022   LDLCALC 66 02/26/2022   TRIG 69.0 02/26/2022   CHOLHDL 3 02/26/2022  On Lipitor 20.  Last eye exam 09/14/2021: + DR, + cataract reportedly.   + Stable numbness and tingling in her feet.  She sees podiatry.  She has a history of left third toe ulcer. Last podiatry visit was with Dr. Romualdo Bolk 12/24/2022.  She has a history of skin graft fourth toe ulcer.   She has a history of nonobstructive CAD, with positive stress test but a negative cardiac cath 2012, and also has diastolic heart failure. She has HTN, venous stasis, vitamin D deficiency.  She denies family history of medullary thyroid cancer and no personal history of pancreatitis.  Review of Systems + see HPI  I reviewed pt's medications, allergies, PMH, social hx, family hx, and changes were documented in the history of present illness. Otherwise, unchanged from my initial visit note.  Past Medical History:  Diagnosis Date   Anemia of chronic disease 11/17/2012   not on meds at this time   At risk for heart disease - +stres test 2012 followed by [facility], [place] with neg cardiac cath 03/2010 (  non-obstructive CAD) per review of records, echo 02/2010 normal LVF 01/15/2012   Chronic kidney disease (CKD), stage IV (severe) (HCC) 01/09/2012   Diabetes mellitus with renal manifestations, uncontrolled    on meds   Diabetic neuropathy (HCC) 01/09/2012   Diastolic heart failure - stage 1 per review of echo results from 02/2010 01/15/2012   GERD (gastroesophageal reflux disease)    OTC meds for tx   Hyperlipidemia    on meds   Hypertension    on meds   Obesity    Peripheral arterial disease? Eval by vascular 2013 with no LE PAD and advised to stop pletal    Severe obesity (BMI >= 40) (HCC) 11/13/2012   Toe ulcer, right (HCC)    Ulcer of toe of left foot (HCC)    treated by [facility] per  patient   Venous stasis of lower extremity 03/10/2012   Past Surgical History:  Procedure Laterality Date   COLONOSCOPY  2013/2014   [place]- records not available    REFRACTIVE SURGERY Bilateral    TOE AMPUTATION Right 2008   RIGHT    Social History   Socioeconomic History   Marital status: Married    Spouse name: Not on file   Number of children: 2   Years of education: Not on file   Highest education level: GED or equivalent  Occupational History   Not on file  Tobacco Use   Smoking status: Former    Current packs/day: 0.00    Types: Cigarettes    Quit date: 02/20/1999    Years since quitting: 23.9   Smokeless tobacco: Never  Vaping Use   Vaping status: Never Used  Substance and Sexual Activity   Alcohol use: Yes    Alcohol/week: 4.0 standard drinks of alcohol    Types: 4 Standard drinks or equivalent per week    Comment: Occasional Drinks   Drug use: Not Currently   Sexual activity: Not on file  Other Topics Concern   Not on file  Social History Narrative   Are you right handed or left handed? Right Handed    Are you currently employed ? Yes. Work 2-3 days at [employer] is your current occupation?   Do you live at home alone? No   Who lives with you? Husband, daughter, and granddaughter    What type of home do you live in: 1 story or 2 story? One story home        Social Drivers of Health   Financial Resource Strain: Medium Risk (03/13/2022)   Overall Financial Resource Strain (CARDIA)    Difficulty of Paying Living Expenses: Somewhat hard  Food Insecurity: Unknown (03/13/2022)   Hunger Vital Sign    Worried About Running Out of Food in the Last Year: Never true    Ran Out of Food in the Last Year: Not on file  Transportation Needs: Unknown (03/13/2022)   PRAPARE - Administrator, Civil Service (Medical): Not on file    Lack of Transportation (Non-Medical): No  Physical Activity: Insufficiently Active (03/13/2022)   Exercise Vital Sign    Days  of Exercise per Week: 2 days    Minutes of Exercise per Session: 60 min  Stress: No Stress Concern Present (03/13/2022)   [employer] of Occupational Health - Occupational Stress Questionnaire    Feeling of Stress : Only a little  Social Connections: Socially Integrated (03/13/2022)   Social Connection and Isolation Panel [NHANES]    Frequency  of Communication with Friends and Family: Once a week    Frequency of Social Gatherings with Friends and Family: Twice a week    Attends Religious Services: More than 4 times per year    Active Member of [employer] or Organizations: Yes    Attends Engineer, structural: More than 4 times per year    Marital Status: Married  Catering manager Violence: Unknown (05/25/2021)   Received from [employer] [facility], [facility]   HITS    Physically Hurt: Not on file    Insult or Talk Down To: Not on file    Threaten Physical Harm: Not on file    Scream or Curse: Not on file   Current Outpatient Medications on File Prior to Visit  Medication Sig Dispense Refill   atorvastatin (LIPITOR) 20 MG tablet TAKE 1 TABLET BY MOUTH EVERY DAY 90 tablet 1   Blood Glucose Monitoring Suppl (ACCU-CHEK GUIDE ME) w/Device KIT Use as instructed to check blood sugar 4 times daily 1 kit 0   Continuous Glucose Sensor (DEXCOM G7 SENSOR) MISC USE AS INSTRUCTED TO CHECK BLOOD SUGAR. CHANGE EVERY 10 DAYS 3 each 6   furosemide (LASIX) 40 MG tablet TAKE 1 TABLET BY MOUTH TWICE A DAY 180 tablet 1   insulin aspart (NOVOLOG FLEXPEN) 100 UNIT/ML FlexPen INJECT UP TO 90 UNITS DAILY AS INSTRUCTED 60 mL 1   insulin degludec (TRESIBA FLEXTOUCH) 100 UNIT/ML FlexTouch Pen INJECT UP TO 80 UNITS A DAY AT BEDTIME 75 mL 0   Insulin Pen Needle (B-D UF III MINI PEN NEEDLES) 31G X 5 MM MISC Use to inject medication 5 times a day 1000 each 3   lisinopril (ZESTRIL) 10 MG tablet TAKE 1 TABLET BY MOUTH EVERY DAY 90 tablet 1   metoprolol succinate (TOPROL-XL) 25 MG 24 hr tablet TAKE 1 TABLET BY MOUTH  EVERY DAY 90 tablet 1   mupirocin ointment (BACTROBAN) 2 % SMARTSIG:1 Application Topical 2-3 Times Daily     Semaglutide, 1 MG/DOSE, (OZEMPIC, 1 MG/DOSE,) 4 MG/3ML SOPN INJECT 1MG  INTO THE SKIN ONCE A WEEK 3 mL 3   Vitamin D, Ergocalciferol, (DRISDOL) 1.25 MG (50000 UNIT) CAPS capsule Take 1 capsule by mouth once a week.     No current facility-administered medications on file prior to visit.   No Known Allergies Family History  Problem Relation Age of Onset   Diabetes Mother    Hyperlipidemia Mother    Hypertension Mother    Heart attack Mother    Stroke Mother    Diabetes Sister    Breast cancer Sister 30   Hypertension Brother    Diabetes Sister    Diabetes Sister    Obesity Other    Hypertension Other    Hyperlipidemia Other    Stroke Other    Heart attack Other    Colon polyps Neg Hx    Colon cancer Neg Hx    Esophageal cancer Neg Hx    Rectal cancer Neg Hx    Stomach cancer Neg Hx    Objective:   Physical Exam BP 130/70   Pulse 72   Ht 5\' 10"  (1.778 m)   Wt 296 lb 3.2 oz (134.4 kg)   SpO2 98%   BMI 42.50 kg/m   Wt Readings from Last 3 Encounters:  01/31/23 296 lb 3.2 oz (134.4 kg)  11/29/22 297 lb 8 oz (134.9 kg)  05/10/22 299 lb (135.6 kg)   Constitutional: overweight, in NAD Eyes:  no exophthalmos ENT:  +  thyromegaly, no cervical lymphadenopathy Cardiovascular: RRR, No MRG, + B LE edema Respiratory: CTA B Musculoskeletal: no deformities Skin: moist, warm, no rashes Neurological: no tremor with outstretched hands  Assessment:     1. DM2, insulin-dependent, uncontrolled, with complications - nonobstructive CAD - CKD - diabetes neuropathy - diabetic retinopathy - PVD - R amputated toe  2. HL  3. Obesity class 3  4.  Isthmic thyroid nodule    Plan:     1. Pt with history of uncontrolled type 2 diabetes, on basal-bolus insulin regimen and weekly GLP-1 receptor agonist, with previously improving control but latest HbA1c obtained in 04/2022  higher, at 7.5%.  At last visit, sugars were increasing after the first meal of the day (brand) and also after the snack at night.  They were dropping in the afternoon and upon questioning, this was likely happening due to taking NovoLog too late.  Also, she was not taking NovoLog before snacks at night as she was not sure which insulin to take.  We discussed about ideally eliminating the late snack at night but if she had to have it, to inject 5 to 10 units of NovoLog before this.  I also advised her to set alarms on her phone to remember to take NovoLog 15 minutes before meals.  We did not change the regimen otherwise. CGM interpretation: -At today's visit, we reviewed her CGM downloads: It appears that 62% of values are in target range (goal >70%), while 38% are higher than 180 (goal <25%), and 0% are lower than 70 (goal <4%).  The calculated average blood sugar is 171.  The projected HbA1c for the next 3 months (GMI) is 7.4%. -Reviewing the CGM trends, sugars appear to be fluctuating in the upper range of the target interval overnight and then increasing significantly after breakfast and then again after dinner.  Upon questioning, she is trying to take the block before meals, but she is not actually remember to take it 15 minutes before.  We discussed that this is quite important.  However, I also felt that we could either increase Ozempic or NovoLog.  She declines increasing Ozempic due to some nausea that she already has.  In that case, I suggested to increase NovoLog but she would like to first work on bolusing for the meals 15 minutes before starting eating. -She is seeing Dr. Kathrene Bongo with nephrology.  She would suggest an SGLT2 inhibitor if patient develops proteinuria.  Latest ACR was normal, 4. -  I advised her to: Patient Instructions  Please use the following regimen: - Basaglar 20 units in a.m. and 20 units at night - Novolog dose: 20-25 units 15 min before every meal and 5-10 units  before a snack - Ozempic 1 mg weekly  Please return in 3 months.  - we checked her HbA1c: 8.0% (higher) - advised to check sugars at different times of the day - 4x a day, rotating check times - advised for yearly eye exams >> she is UTD - return to clinic in 3 months  2. HL -Latest lipid panel reviewed from 02/2022: LDL above our target of less than 55, otherwise fractions at goal: Lab Results  Component Value Date   CHOL 125 02/26/2022   HDL 45.50 02/26/2022   LDLCALC 66 02/26/2022   TRIG 69.0 02/26/2022   CHOLHDL 3 02/26/2022  -She continues Lipitor 20 mg daily without side effects  3.  Obesity class III -She continues on Ozempic, we can also help with weight loss -  She gained 3 pounds before last visit, but previously lost 18 pounds after starting Ozempic -At today's visit we discussed about possibly increasing Ozempic but she does have some nausea and would like to stay on the same dose for now -She lost 3 pounds since last visit  4.  Thyroid nodules -She had a mass palpated in the central lower neck at last visit. -We checked a right ultrasound (05/24/2022) and this showed an isthmic nodule, without worrisome features, but which required a 1 year follow-up.  Plan to order a new ultrasound on the next visit.  She also had a subcentimeter, spongiform, thyroid nodule in the inferior left lobe, for which no intervention or follow-up was needed. -At today's visit she does not have neck compression symptoms  Carlus Pavlov, MD PhD [facility] Endocrinology

## 2023-02-06 ENCOUNTER — Other Ambulatory Visit: Payer: Self-pay | Admitting: Internal Medicine

## 2023-02-22 ENCOUNTER — Telehealth: Payer: Self-pay

## 2023-02-22 MED ORDER — LANTUS SOLOSTAR 100 UNIT/ML ~~LOC~~ SOPN: 25.0000 [IU] | PEN_INJECTOR | Freq: Two times a day (BID) | SUBCUTANEOUS | 3 refills | Status: DC

## 2023-02-22 NOTE — Telephone Encounter (Signed)
 Requested Prescriptions   Signed Prescriptions Disp Refills   insulin  glargine (LANTUS  SOLOSTAR) 100 UNIT/ML Solostar Pen 45 mL 3    Sig: Inject 25 Units into the skin 2 (two) times daily.    Authorizing Provider: TRIXIE FILE    Ordering User: CLEOTILDE ROLIN RAMAN

## 2023-02-28 ENCOUNTER — Ambulatory Visit (INDEPENDENT_AMBULATORY_CARE_PROVIDER_SITE_OTHER): Payer: Medicare Other | Admitting: Family Medicine

## 2023-02-28 ENCOUNTER — Encounter: Payer: Self-pay | Admitting: Family Medicine

## 2023-02-28 DIAGNOSIS — Z0000 Encounter for general adult medical examination without abnormal findings: Secondary | ICD-10-CM

## 2023-02-28 DIAGNOSIS — Z1231 Encounter for screening mammogram for malignant neoplasm of breast: Secondary | ICD-10-CM | POA: Diagnosis not present

## 2023-02-28 NOTE — Progress Notes (Signed)
 PATIENT CHECK-IN and HEALTH RISK ASSESSMENT QUESTIONNAIRE:  -completed by phone/video for upcoming Medicare Preventive Visit  Pre-Visit Check-in: 1)Vitals (height, wt, BP, etc) - record in vitals section for visit on day of visit Request home vitals (wt, BP, etc.) and enter into vitals, THEN update Vital Signs SmartPhrase below at the top of the HPI. See below.  2)Review and Update Medications, Allergies PMH, Surgeries, Social history in Epic 3)Hospitalizations in the last year with date/reason? No  4)Review and Update Care Team (patient's specialists) in Epic 5) Complete PHQ9 in Epic  6) Complete Fall Screening in Epic 7)Review all Health Maintenance Due and order under PCP if not done.  Medicare Wellness Patient Questionnaire:  Answer theses question about your habits: How often do you have a drink containing alcohol? Has hx of alcohol use 4 drinks per week  Have you ever smoked?Yes Quit date if applicable? 30 years   How many packs a day do/did you smoke? Less than 1  Do you use smokeless tobacco?No Do you use an illicit drugs?No On average, how many days per week do you engage in moderate to strenuous exercise (like a brisk walk)? Chair yoga 2-3 days per week about 1 hour, exercises bands Are you sexually active? Yes Number of partners? 1 Typical breakfast: Varies  Typical lunch: Varies  Typical dinner: Varies  Typical snacks:Chips   Beverages: water   Answer theses question about your everyday activities: Can you perform most household chores? Yes Are you deaf or have significant trouble hearing? Yes  Do you feel that you have a problem with memory? No Do you feel safe at home? Yes  Last dentist visit? 3 years  8. Do you have any difficulty performing your everyday activities?No Are you having any difficulty walking, taking medications on your own, and or difficulty managing daily home needs?No Do you have difficulty walking or climbing stairs?No Do you have difficulty  dressing or bathing?No Do you have difficulty doing errands alone such as visiting a doctor's office or shopping? No Do you currently have any difficulty preparing food and eating?No Do you currently have any difficulty using the toilet?No Do you have any difficulty managing your finances? No Do you have any difficulties with housekeeping of managing your housekeeping?No   Do you have Advanced Directives in place (Living Will, Healthcare Power or Attorney)?   No   Last eye Exam and location? 1.5 years ago - reports has appt at the end of this month   Do you currently use prescribed or non-prescribed narcotic or opioid pain medications? No  Do you have a history or close family history of breast, ovarian, tubal or peritoneal cancer or a family member with BRCA (breast cancer susceptibility 1 and 2) gene mutations? Yes- Ovarian       ----------------------------------------------------------------------------------------------------------------------------------------------------------------------------------------------------------------------  Because this visit was a virtual/telehealth visit, some criteria may be missing or patient reported. Any vitals not documented were not able to be obtained and vitals that have been documented are patient reported.    MEDICARE ANNUAL PREVENTIVE VISIT WITH PROVIDER: (Welcome to Medicare, initial annual wellness or annual wellness exam)  Virtual Visit via VPhone Note  I connected with Savannah Crawford on 02/28/23 by phone, as internet connection was not strong enough for video telemedicine application, and verified that I am speaking with the correct person using two identifiers.  Location patient: home Location provider:work or home office Persons participating in the virtual visit: patient, provider  Concerns and/or follow up today: denies concerns  See HM section in Epic for other details of completed HM.    ROS: negative for  report of fevers, unintentional weight loss, vision changes, vision loss, hearing loss or change, chest pain, sob, hemoptysis, melena, hematochezia, hematuria, falls, bleeding or bruising, thoughts of suicide or self harm, memory loss  Patient-completed extensive health risk assessment - reviewed and discussed with the patient: See Health Risk Assessment completed with patient prior to the visit either above or in recent phone note. This was reviewed in detailed with the patient today and appropriate recommendations, orders and referrals were placed as needed per Summary below and patient instructions.   Review of Medical History: -PMH, PSH, Family History and current specialty and care providers reviewed and updated and listed below   Patient Care Team: Theophilus Andrews, Tully GRADE, MD as PCP - General (Internal Medicine) Trixie File, MD as Consulting Physician (Internal Medicine) [place] Jori Hummer, MD as Referring Physician (Nephrology) Ladora Ross Lacy Phebe, MD as Referring Physician (Optometry) Fulton Rain, MD as Consulting Physician (Podiatry) Tobie Tonita POUR, DO as Consulting Physician (Neurology) Rayburn Pac, MD as Consulting Physician (Nephrology)   Past Medical History:  Diagnosis Date   Anemia of chronic disease 11/17/2012   not on meds at this time   At risk for heart disease - +stres test 2012 followed by [facility], [place] with neg cardiac cath 03/2010 (non-obstructive CAD) per review of records, echo 02/2010 normal LVF 01/15/2012   Chronic kidney disease (CKD), stage IV (severe) (HCC) 01/09/2012   Diabetes mellitus with renal manifestations, uncontrolled    on meds   Diabetic neuropathy (HCC) 01/09/2012   Diastolic heart failure - stage 1 per review of echo results from 02/2010 01/15/2012   GERD (gastroesophageal reflux disease)    OTC meds for tx   Hyperlipidemia    on meds   Hypertension    on meds   Obesity    Peripheral arterial disease? Eval by  vascular 2013 with no LE PAD and advised to stop pletal    Severe obesity (BMI >= 40) (HCC) 11/13/2012   Toe ulcer, right (HCC)    Ulcer of toe of left foot (HCC)    treated by [facility] per patient   Venous stasis of lower extremity 03/10/2012    Past Surgical History:  Procedure Laterality Date   COLONOSCOPY  2013/2014   [place]- records not available    REFRACTIVE SURGERY Bilateral    TOE AMPUTATION Right 2008   RIGHT     Social History   Socioeconomic History   Marital status: Married    Spouse name: Not on file   Number of children: 2   Years of education: Not on file   Highest education level: GED or equivalent  Occupational History   Not on file  Tobacco Use   Smoking status: Former    Current packs/day: 0.00    Types: Cigarettes    Quit date: 02/20/1999    Years since quitting: 24.0   Smokeless tobacco: Never  Vaping Use   Vaping status: Never Used  Substance and Sexual Activity   Alcohol use: Yes    Alcohol/week: 4.0 standard drinks of alcohol    Types: 4 Standard drinks or equivalent per week    Comment: Occasional Drinks   Drug use: Not Currently   Sexual activity: Not on file  Other Topics Concern   Not on file  Social History Narrative   Are you right handed or left handed? Right Handed  Are you currently employed ? Yes. Work 2-3 days at [employer] is your current occupation?   Do you live at home alone? No   Who lives with you? Husband, daughter, and granddaughter    What type of home do you live in: 1 story or 2 story? One story home        Social Drivers of Health   Financial Resource Strain: Low Risk  (02/28/2023)   Overall Financial Resource Strain (CARDIA)    Difficulty of Paying Living Expenses: Not hard at all  Food Insecurity: No Food Insecurity (02/28/2023)   Hunger Vital Sign    Worried About Running Out of Food in the Last Year: Never true    Ran Out of Food in the Last Year: Never true  Transportation Needs: No Transportation  Needs (02/28/2023)   PRAPARE - Administrator, Civil Service (Medical): No    Lack of Transportation (Non-Medical): No  Physical Activity: Sufficiently Active (02/28/2023)   Exercise Vital Sign    Days of Exercise per Week: 3 days    Minutes of Exercise per Session: 60 min  Stress: No Stress Concern Present (02/28/2023)   [employer] of Occupational Health - Occupational Stress Questionnaire    Feeling of Stress : Not at all  Social Connections: Moderately Integrated (02/28/2023)   Social Connection and Isolation Panel [NHANES]    Frequency of Communication with Friends and Family: More than three times a week    Frequency of Social Gatherings with Friends and Family: Three times a week    Attends Religious Services: More than 4 times per year    Active Member of Clubs or Organizations: No    Attends Banker Meetings: Never    Marital Status: Married  Catering Manager Violence: Not At Risk (02/28/2023)   Humiliation, Afraid, Rape, and Kick questionnaire    Fear of Current or Ex-Partner: No    Emotionally Abused: No    Physically Abused: No    Sexually Abused: No    Family History  Problem Relation Age of Onset   Diabetes Mother    Hyperlipidemia Mother    Hypertension Mother    Heart attack Mother    Stroke Mother    Diabetes Sister    Breast cancer Sister 71   Hypertension Brother    Diabetes Sister    Diabetes Sister    Obesity Other    Hypertension Other    Hyperlipidemia Other    Stroke Other    Heart attack Other    Colon polyps Neg Hx    Colon cancer Neg Hx    Esophageal cancer Neg Hx    Rectal cancer Neg Hx    Stomach cancer Neg Hx     Current Outpatient Medications on File Prior to Visit  Medication Sig Dispense Refill   atorvastatin  (LIPITOR) 20 MG tablet TAKE 1 TABLET BY MOUTH EVERY DAY 90 tablet 1   Blood Glucose Monitoring Suppl (ACCU-CHEK GUIDE ME) w/Device KIT Use as instructed to check blood sugar 4 times daily 1 kit 0    Continuous Glucose Sensor (DEXCOM G7 SENSOR) MISC Use as instructed to check blood sugar. Change every 10 days 9 each 3   furosemide  (LASIX ) 40 MG tablet TAKE 1 TABLET BY MOUTH TWICE A DAY 180 tablet 1   insulin  aspart (NOVOLOG  FLEXPEN) 100 UNIT/ML FlexPen INJECT UP TO 90 UNITS DAILY AS INSTRUCTED 60 mL 3   insulin  glargine (LANTUS  SOLOSTAR) 100 UNIT/ML  Solostar Pen Inject 25 Units into the skin 2 (two) times daily. 45 mL 3   Insulin  Pen Needle (B-D UF III MINI PEN NEEDLES) 31G X 5 MM MISC Use to inject medication 5 times a day 1000 each 3   lisinopril  (ZESTRIL ) 10 MG tablet TAKE 1 TABLET BY MOUTH EVERY DAY 90 tablet 1   metoprolol  succinate (TOPROL -XL) 25 MG 24 hr tablet TAKE 1 TABLET BY MOUTH EVERY DAY 90 tablet 1   mupirocin ointment (BACTROBAN) 2 % SMARTSIG:1 Application Topical 2-3 Times Daily     Semaglutide , 1 MG/DOSE, (OZEMPIC , 1 MG/DOSE,) 4 MG/3ML SOPN Inject 1 mg under skin weekly 9 mL 3   Vitamin D, Ergocalciferol, (DRISDOL) 1.25 MG (50000 UNIT) CAPS capsule Take 1 capsule by mouth once a week.     No current facility-administered medications on file prior to visit.    No Known Allergies     Physical Exam Vitals requested from patient and listed below if patient had equipment and was able to obtain at home for this virtual visit: There were no vitals filed for this visit. Estimated body mass index is 42.5 kg/m as calculated from the following:   Height as of 01/31/23: 5' 10 (1.778 m).   Weight as of 01/31/23: 296 lb 3.2 oz (134.4 kg).  EKG (optional): deferred due to virtual visit  GENERAL: alert, oriented, no acute distress detected, full vision exam deferred due to pandemic and/or virtual encounter  PSYCH/NEURO: pleasant and cooperative, no obvious depression or anxiety, speech and thought processing grossly intact, Cognitive function grossly intact  Flowsheet Row Office Visit from 02/28/2023 in [facility] [place]  PHQ-9 Total Score 0            02/28/2023    2:50 PM 05/10/2022    2:02 PM 03/14/2022   12:02 PM 02/21/2022    4:34 PM 11/13/2021   12:13 PM  Depression screen PHQ 2/9  Decreased Interest 0 0 1 0 2  Down, Depressed, Hopeless 0 0 0 0 0  PHQ - 2 Score 0 0 1 0 2  Altered sleeping 0 0 1 1 0  Tired, decreased energy 0 0 1 0 1  Change in appetite 0 0 0 0 0  Feeling bad or failure about yourself  0 0 0 0 0  Trouble concentrating 0 0 0 0 0  Moving slowly or fidgety/restless 0 0 0 0 0  Suicidal thoughts 0 0 0 0 0  PHQ-9 Score 0 0 3 1 3   Difficult doing work/chores Not difficult at all Not difficult at all Not difficult at all Not difficult at all Not difficult at all       12/18/2021    2:46 PM 02/21/2022    4:34 PM 03/13/2022   10:14 AM 03/14/2022   12:02 PM 02/28/2023    2:51 PM  Fall Risk  Falls in the past year? 0 1 0 0 0  Was there an injury with Fall? 0 0  0 0  Fall Risk Category Calculator 0 1  0 0  Fall Risk Category (Retired) Low Low     (RETIRED) Patient Fall Risk Level Low fall risk Low fall risk     Patient at Risk for Falls Due to  Impaired balance/gait  No Fall Risks No Fall Risks  Fall risk Follow up Falls evaluation completed Falls evaluation completed  Falls evaluation completed Falls evaluation completed     SUMMARY AND PLAN:  Encounter for Medicare annual wellness  exam  Encounter for screening mammogram for malignant neoplasm of breast - Plan: MM 3D SCREENING MAMMOGRAM BILATERAL BREAST  Discussed applicable health maintenance/preventive health measures and advised and referred or ordered per patient preferences: -recommended mammogram and ordered and provided her number to call to schedule -advised gyn visit given fh, she agrees to schedule -advised Tdap and she plans to get at the pharmacy -advised diabetic eye exam and she reports is already schedule, advised to send report to Dr. Theophilus Health Maintenance  Topic Date Due   DTaP/Tdap/Td (2 - Td or Tdap) 12/16/2021   MAMMOGRAM   07/08/2022   OPHTHALMOLOGY EXAM  09/15/2022   COVID-19 Vaccine (4 - 2024-25 season) 10/21/2022   HEMOGLOBIN A1C  08/01/2023   Diabetic kidney evaluation - eGFR measurement  10/30/2023   Diabetic kidney evaluation - Urine ACR  10/30/2023   Medicare Annual Wellness (AWV)  02/28/2024   Colonoscopy  02/10/2030   Pneumonia Vaccine 28+ Years old  Completed   INFLUENZA VACCINE  Completed   DEXA SCAN  Completed   Hepatitis C Screening  Completed   Zoster Vaccines- Shingrix   Completed   HPV VACCINES  Aged [employer] and counseling on the following was provided based on the above review of health and a plan/checklist for the patient, along with additional information discussed, was provided for the patient in the patient instructions :  -Advised on importance of completing advanced directives, discussed options for completing and provided information in patient instructions as well -Advised and counseled on a healthy lifestyle  -Reviewed patient's current diet. Advised and counseled on a whole foods based healthy diet with particular emphasis on evidence based healing diet for diabetes and good metabolism. A summary of a healthy diet was provided in the Patient Instructions.  -reviewed patient's current physical activity level and discussed exercise guidelines for adults. Discussed community resources and ideas for safe exercise at home to assist in meeting exercise guideline recommendations in a safe and healthy way. Congratulated on regular exercises and encouraged to continue! -Advise yearly dental visits at minimum and regular eye exams  Follow up: see patient instructions     Patient Instructions  I really enjoyed getting to talk with you today! I am available on Tuesdays and Thursdays for virtual visits if you have any questions or concerns, or if I can be of any further assistance.   CHECKLIST FROM ANNUAL WELLNESS VISIT:  -Follow up (please call to schedule if not scheduled  after visit):   -yearly for annual wellness visit with primary care office  Here is a list of your preventive care/health maintenance measures and the plan for each if any are due:  PLAN For any measures below that may be due:    Health Maintenance  Topic Date Due   DTaP/Tdap/Td (2 - Td or Tdap) 12/16/2021   MAMMOGRAM  07/08/2022   OPHTHALMOLOGY EXAM  09/15/2022   COVID-19 Vaccine (4 - 2024-25 season) 10/21/2022   HEMOGLOBIN A1C  08/01/2023   Diabetic kidney evaluation - eGFR measurement  10/30/2023   Diabetic kidney evaluation - Urine ACR  10/30/2023   Medicare Annual Wellness (AWV)  02/28/2024   Colonoscopy  02/10/2030   Pneumonia Vaccine 60+ Years old  Completed   INFLUENZA VACCINE  Completed   DEXA SCAN  Completed   Hepatitis C Screening  Completed   Zoster Vaccines- Shingrix   Completed   HPV VACCINES  Aged Out    -See a dentist at least yearly  -  Get your eyes checked and then per your eye specialist's recommendations  -Other issues addressed today:   -I have included below further information regarding a healthy whole foods based diet, physical activity guidelines for adults, stress management and opportunities for social connections. I hope you find this information useful.   -----------------------------------------------------------------------------------------------------------------------------------------------------------------------------------------------------------------------------------------------------------  NUTRITION: -eat real food: lots of colorful vegetables (half the plate) and fruits -5-7 servings of vegetables and fruits per day (fresh or steamed is best), exp. 2 servings of vegetables with lunch and dinner and 2 servings of fruit per day. Berries and greens such as kale and collards are great choices.  -consume on a regular basis: whole grains (make sure first ingredient on label contains the word whole), fresh fruits, fish, nuts, seeds,  healthy oils (such as olive oil, avocado oil, grape seed oil) -may eat small amounts of dairy and lean meat on occasion, but avoid processed meats such as ham, bacon, lunch meat, etc. -drink water -try to avoid fast food and pre-packaged foods, processed meat -most experts advise limiting sodium to < 2300mg  per day, should limit further is any chronic conditions such as high blood pressure, heart disease, diabetes, etc. The American Heart Association advised that < 1500mg  is is ideal -try to avoid foods that contain any ingredients with names you do not recognize  -try to avoid sugar/sweets (except for the natural sugar that occurs in fresh fruit) -try to avoid sweet drinks -try to avoid white rice, white bread, pasta (unless whole grain), white or yellow potatoes  EXERCISE GUIDELINES FOR ADULTS: -if you wish to increase your physical activity, do so gradually and with the approval of your doctor -STOP and seek medical care immediately if you have any chest pain, chest discomfort or trouble breathing when starting or increasing exercise  -move and stretch your body, legs, feet and arms when sitting for long periods -Physical activity guidelines for optimal health in adults: -least 150 minutes per week of aerobic exercise (can talk, but not sing) once approved by your doctor, 20-30 minutes of sustained activity or two 10 minute episodes of sustained activity every day.  -resistance training at least 2 days per week if approved by your doctor -balance exercises 3+ days per week:   Stand somewhere where you have something sturdy to hold onto if you lose balance.    1) lift up on toes, start with 5x per day and work up to 20x   2) stand and lift on leg straight out to the side so that foot is a few inches of the floor, start with 5x each side and work up to 20x each side   3) stand on one foot, start with 5 seconds each side and work up to 20 seconds on each side  If you need ideas or help with  getting more active:  -Silver sneakers https://tools.silversneakers.com  -Walk with a Doc: Http://www.duncan-williams.com/  -try to include resistance (weight lifting/strength building) and balance exercises twice per week: or the following link for ideas: http://castillo-powell.com/  buyducts.dk  STRESS MANAGEMENT: -can try meditating, or just sitting quietly with deep breathing while intentionally relaxing all parts of your body for 5 minutes daily -if you need further help with stress, anxiety or depression please follow up with your primary doctor or contact the wonderful folks at [facility]: [phone]  SOCIAL CONNECTIONS: -options in [place] if you wish to engage in more social and exercise related activities:  -Silver sneakers https://tools.silversneakers.com  -Walk with a Doc: Http://www.duncan-williams.com/  -  Check out the [facility] Active Adults 50+ section on the [place] of [employer] companies (hiking clubs, book clubs, cards and games, chess, exercise classes, aquatic classes and much more) - see the website for details: https://www.[place].gov/departments/parks-recreation/active-adults50  -YouTube has lots of exercise videos for different ages and abilities as well  -[facility] (a variety of indoor and outdoor inperson activities for adults). [phone]. [address]  -Virtual Online Classes (a variety of topics): see seniorplanet.org or call [phone]  -consider volunteering at a school, hospice center, church, senior center or elsewhere   ADVANCED HEALTHCARE DIRECTIVES:  [place] Advanced Directives assistance:   expressweek.com.cy  Everyone should have advanced health care directives in place. This is so that you get the care you want, should you ever be in a situation where you  are unable to make your own medical decisions.   From the [place] Advanced Directive Website: Advance Health Care Directives are legal documents in which you give written instructions about your health care if, in the future, you cannot speak for yourself.   A health care power of attorney allows you to name a person you trust to make your health care decisions if you cannot make them yourself. A declaration of a desire for a natural death (or living will) is document, which states that you desire not to have your life prolonged by extraordinary measures if you have a terminal or incurable illness or if you are in a vegetative state. An advance instruction for mental health treatment makes a declaration of instructions, information and preferences regarding your mental health treatment. It also states that you are aware that the advance instruction authorizes a mental health treatment provider to act according to your wishes. It may also outline your consent or refusal of mental health treatment. A declaration of an anatomical gift allows anyone over the age of 36 to make a gift by will, organ donor card or other document.   Please see the following website or an elder law attorney for forms, FAQs and for completion of advanced directives: [place]  Print Production Planner Health Care Directives Advance Health Care Directives (http://guzman.com/)  Or copy and paste the following to your web browser: Poshchat.fi          Chiquita JONELLE Cramp, DO

## 2023-02-28 NOTE — Patient Instructions (Signed)
 I really enjoyed getting to talk with you today! I am available on Tuesdays and Thursdays for virtual visits if you have any questions or concerns, or if I can be of any further assistance.   CHECKLIST FROM ANNUAL WELLNESS VISIT:  -Follow up (please call to schedule if not scheduled after visit):   -yearly for annual wellness visit with primary care office  Here is a list of your preventive care/health maintenance measures and the plan for each if any are due:  PLAN For any measures below that may be due:    Health Maintenance  Topic Date Due   DTaP/Tdap/Td (2 - Td or Tdap) 12/16/2021   MAMMOGRAM  07/08/2022   OPHTHALMOLOGY EXAM  09/15/2022   COVID-19 Vaccine (4 - 2024-25 season) 10/21/2022   HEMOGLOBIN A1C  08/01/2023   Diabetic kidney evaluation - eGFR measurement  10/30/2023   Diabetic kidney evaluation - Urine ACR  10/30/2023   Medicare Annual Wellness (AWV)  02/28/2024   Colonoscopy  02/10/2030   Pneumonia Vaccine 31+ Years old  Completed   INFLUENZA VACCINE  Completed   DEXA SCAN  Completed   Hepatitis C Screening  Completed   Zoster Vaccines- Shingrix   Completed   HPV VACCINES  Aged Out    -See a dentist at least yearly  -Get your eyes checked and then per your eye specialist's recommendations  -Other issues addressed today:   -I have included below further information regarding a healthy whole foods based diet, physical activity guidelines for adults, stress management and opportunities for social connections. I hope you find this information useful.   -----------------------------------------------------------------------------------------------------------------------------------------------------------------------------------------------------------------------------------------------------------  NUTRITION: -eat real food: lots of colorful vegetables (half the plate) and fruits -5-7 servings of vegetables and fruits per day (fresh or steamed is best), exp. 2  servings of vegetables with lunch and dinner and 2 servings of fruit per day. Berries and greens such as kale and collards are great choices.  -consume on a regular basis: whole grains (make sure first ingredient on label contains the word whole), fresh fruits, fish, nuts, seeds, healthy oils (such as olive oil, avocado oil, grape seed oil) -may eat small amounts of dairy and lean meat on occasion, but avoid processed meats such as ham, bacon, lunch meat, etc. -drink water -try to avoid fast food and pre-packaged foods, processed meat -most experts advise limiting sodium to < 2300mg  per day, should limit further is any chronic conditions such as high blood pressure, heart disease, diabetes, etc. The American Heart Association advised that < 1500mg  is is ideal -try to avoid foods that contain any ingredients with names you do not recognize  -try to avoid sugar/sweets (except for the natural sugar that occurs in fresh fruit) -try to avoid sweet drinks -try to avoid white rice, white bread, pasta (unless whole grain), white or yellow potatoes  EXERCISE GUIDELINES FOR ADULTS: -if you wish to increase your physical activity, do so gradually and with the approval of your doctor -STOP and seek medical care immediately if you have any chest pain, chest discomfort or trouble breathing when starting or increasing exercise  -move and stretch your body, legs, feet and arms when sitting for long periods -Physical activity guidelines for optimal health in adults: -least 150 minutes per week of aerobic exercise (can talk, but not sing) once approved by your doctor, 20-30 minutes of sustained activity or two 10 minute episodes of sustained activity every day.  -resistance training at least 2 days per week if approved by your  doctor -balance exercises 3+ days per week:   Stand somewhere where you have something sturdy to hold onto if you lose balance.    1) lift up on toes, start with 5x per day and work up to  20x   2) stand and lift on leg straight out to the side so that foot is a few inches of the floor, start with 5x each side and work up to 20x each side   3) stand on one foot, start with 5 seconds each side and work up to 20 seconds on each side  If you need ideas or help with getting more active:  -Silver sneakers https://tools.silversneakers.com  -Walk with a Doc: Http://www.duncan-williams.com/  -try to include resistance (weight lifting/strength building) and balance exercises twice per week: or the following link for ideas: http://castillo-powell.com/  buyducts.dk  STRESS MANAGEMENT: -can try meditating, or just sitting quietly with deep breathing while intentionally relaxing all parts of your body for 5 minutes daily -if you need further help with stress, anxiety or depression please follow up with your primary doctor or contact the wonderful folks at [facility]: [phone]  SOCIAL CONNECTIONS: -options in [place] if you wish to engage in more social and exercise related activities:  -Silver sneakers https://tools.silversneakers.com  -Walk with a Doc: Http://www.duncan-williams.com/  -Check out the [facility] Active Adults 50+ section on the [place] of [employer] companies (hiking clubs, book clubs, cards and games, chess, exercise classes, aquatic classes and much more) - see the website for details: https://www.[place].gov/departments/parks-recreation/active-adults50  -YouTube has lots of exercise videos for different ages and abilities as well  -[facility] (a variety of indoor and outdoor inperson activities for adults). [phone]. [address].  -Virtual Online Classes (a variety of topics): see seniorplanet.org or call 7624327497  -consider volunteering at a school, hospice center, church, senior center or elsewhere   ADVANCED HEALTHCARE  DIRECTIVES:  [place] Advanced Directives assistance:   expressweek.com.cy  Everyone should have advanced health care directives in place. This is so that you get the care you want, should you ever be in a situation where you are unable to make your own medical decisions.   From the [place] Advanced Directive Website: Advance Health Care Directives are legal documents in which you give written instructions about your health care if, in the future, you cannot speak for yourself.   A health care power of attorney allows you to name a person you trust to make your health care decisions if you cannot make them yourself. A declaration of a desire for a natural death (or living will) is document, which states that you desire not to have your life prolonged by extraordinary measures if you have a terminal or incurable illness or if you are in a vegetative state. An advance instruction for mental health treatment makes a declaration of instructions, information and preferences regarding your mental health treatment. It also states that you are aware that the advance instruction authorizes a mental health treatment provider to act according to your wishes. It may also outline your consent or refusal of mental health treatment. A declaration of an anatomical gift allows anyone over the age of 84 to make a gift by will, organ donor card or other document.   Please see the following website or an elder law attorney for forms, FAQs and for completion of advanced directives: [place]  Print [employer] Planner Health Care Directives Advance Health Care Directives ([email or web address]/)  Or copy and paste the following to your  web browser: [email or web address]

## 2023-02-28 NOTE — Progress Notes (Signed)
 Patient unable to obtain vital signs due to telehealth visit

## 2023-03-13 ENCOUNTER — Other Ambulatory Visit: Payer: Self-pay | Admitting: Internal Medicine

## 2023-03-13 DIAGNOSIS — E1159 Type 2 diabetes mellitus with other circulatory complications: Secondary | ICD-10-CM

## 2023-03-28 ENCOUNTER — Ambulatory Visit
Admission: RE | Admit: 2023-03-28 | Discharge: 2023-03-28 | Disposition: A | Discharge status: Home / Self Care | Payer: Medicare Other | Source: Ambulatory Visit | Attending: Family Medicine | Admitting: Family Medicine

## 2023-03-28 ENCOUNTER — Ambulatory Visit (INDEPENDENT_AMBULATORY_CARE_PROVIDER_SITE_OTHER): Payer: Medicare Other

## 2023-03-28 DIAGNOSIS — Z1231 Encounter for screening mammogram for malignant neoplasm of breast: Secondary | ICD-10-CM

## 2023-03-28 DIAGNOSIS — I739 Peripheral vascular disease, unspecified: Secondary | ICD-10-CM

## 2023-03-28 LAB — VAS US ABI WITH/WO TBI
Left ABI: 1.06
Right ABI: 1

## 2023-04-01 ENCOUNTER — Encounter: Payer: Self-pay | Admitting: Internal Medicine

## 2023-04-04 ENCOUNTER — Encounter: Payer: Self-pay | Admitting: Obstetrics & Gynecology

## 2023-04-04 ENCOUNTER — Ambulatory Visit: Payer: Medicare Other | Admitting: Obstetrics & Gynecology

## 2023-04-04 ENCOUNTER — Other Ambulatory Visit (HOSPITAL_COMMUNITY)
Admission: RE | Admit: 2023-04-04 | Discharge: 2023-04-04 | Disposition: A | Discharge status: Home / Self Care | Payer: Medicare Other | Source: Ambulatory Visit | Attending: Obstetrics & Gynecology | Admitting: Obstetrics & Gynecology

## 2023-04-04 VITALS — BP 156/76 | HR 90 | Ht 70.0 in | Wt 295.8 lb

## 2023-04-04 DIAGNOSIS — Z0000 Encounter for general adult medical examination without abnormal findings: Secondary | ICD-10-CM

## 2023-04-04 DIAGNOSIS — Z01419 Encounter for gynecological examination (general) (routine) without abnormal findings: Secondary | ICD-10-CM

## 2023-04-04 DIAGNOSIS — R87810 Cervical high risk human papillomavirus (HPV) DNA test positive: Secondary | ICD-10-CM | POA: Diagnosis not present

## 2023-04-04 DIAGNOSIS — Z1151 Encounter for screening for human papillomavirus (HPV): Secondary | ICD-10-CM | POA: Diagnosis not present

## 2023-04-04 NOTE — Progress Notes (Signed)
WELL-WOMAN EXAMINATION Patient name: Savannah Crawford MRN [id number]  Date of birth: 1955-12-16 Chief Complaint:   Gynecologic Exam  History of Present Illness:   Savannah Crawford is a 68 y.o. G1P1001 PM female being seen today for a routine well-woman exam.   She denies vaginal bleeding, discharge, itching or irritation.  She denies pelvic or abdominal pain.  Other than occasional constipation, she reports no acute complaints or concerns  Pt concerned about tenderness that she felt at her sternum  No LMP recorded. Patient is postmenopausal.  The current method of family planning is post menopausal status.    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  Last mammogram: 02/2022. Last colonoscopy: 2021     02/28/2023    2:50 PM 05/10/2022    2:02 PM 03/14/2022   12:02 PM 02/21/2022    4:34 PM 11/13/2021   12:13 PM  Depression screen PHQ 2/9  Decreased Interest 0 0 1 0 2  Down, Depressed, Hopeless 0 0 0 0 0  PHQ - 2 Score 0 0 1 0 2  Altered sleeping 0 0 1 1 0  Tired, decreased energy 0 0 1 0 1  Change in appetite 0 0 0 0 0  Feeling bad or failure about yourself  0 0 0 0 0  Trouble concentrating 0 0 0 0 0  Moving slowly or fidgety/restless 0 0 0 0 0  Suicidal thoughts 0 0 0 0 0  PHQ-9 Score 0 0 3 1 3   Difficult doing work/chores Not difficult at all Not difficult at all Not difficult at all Not difficult at all Not difficult at all      Review of Systems:   Pertinent items are noted in HPI Denies any headaches, blurred vision, fatigue, shortness of breath, chest pain, abdominal pain, bowel movements, urination, or intercourse unless otherwise stated above.  Pertinent History Reviewed:  Reviewed past medical,surgical, social and family history.  Reviewed problem list, medications and allergies. Physical Assessment:   Vitals:   04/04/23 0936 04/04/23 1001  BP: (!) 148/76 (!) 156/76  Pulse: 77 90  Weight: 295 lb 12.8 oz (134.2 kg)   Height: 5\' 10"  (1.778 m)    Body mass index is 42.44 kg/m.        Physical Examination:   General appearance - well appearing, and in no distress  Mental status - alert, oriented to person, place, and time  Psych:  She has a normal mood and affect  Skin - warm and dry, normal color, no suspicious lesions noted  Chest - effort normal, all lung fields clear to auscultation bilaterally  Heart - normal rate and regular rhythm  Breasts - breasts appear normal, no suspicious masses, no skin or nipple changes or  axillary nodes Sternum palpated-  slightly enlarged at xyphoid process- no discrete abnormality noted  Abdomen - obese, soft, nontender, nondistended, no masses or organomegaly  Pelvic - VULVA: normal appearing vulva with no masses, tenderness or lesions  VAGINA: normal appearing vagina with normal color and discharge, no lesions  CERVIX: normal appearing cervix without discharge or lesions, no CMT  Thin prep pap is done with HR HPV cotesting  UTERUS: uterus is felt to be normal size, shape, consistency and nontender   ADNEXA: No adnexal masses or tenderness noted.  Extremities:  No swelling or varicosities noted  Chaperone: Faith Rogue     Assessment & Plan:  1) Well-Woman Exam -due to prior HPV+ (other) repeat testing today, further management pending  results -mammogram up to date   Meds: No orders of the defined types were placed in this encounter.   Follow-up: Return in about 1 year (around 04/03/2024) for Annual.   Myna Hidalgo, DO Attending Obstetrician & Gynecologist, [facility], [facility]

## 2023-04-09 LAB — CYTOLOGY - PAP
Adequacy: ABSENT
Comment: NEGATIVE
Comment: NEGATIVE
Comment: NEGATIVE
Diagnosis: NEGATIVE
HPV 16: NEGATIVE
HPV 18 / 45: NEGATIVE
High risk HPV: POSITIVE — AB

## 2023-04-12 ENCOUNTER — Encounter: Payer: Self-pay | Admitting: Obstetrics & Gynecology

## 2023-05-01 ENCOUNTER — Encounter: Payer: Self-pay | Admitting: *Deleted

## 2023-05-05 ENCOUNTER — Other Ambulatory Visit: Payer: Self-pay | Admitting: Internal Medicine

## 2023-05-05 DIAGNOSIS — E1169 Type 2 diabetes mellitus with other specified complication: Secondary | ICD-10-CM

## 2023-05-06 LAB — BASIC METABOLIC PANEL
BUN: 21 (ref 4–21)
CO2: 22 (ref 13–22)
Chloride: 105 (ref 99–108)
Creatinine: 1.4 — AB (ref 0.5–1.1)
Glucose: 224
Potassium: 4.1 meq/L (ref 3.5–5.1)
Sodium: 139 (ref 137–147)

## 2023-05-06 LAB — PROTEIN / CREATININE RATIO, URINE: Albumin, U: 8.9

## 2023-05-06 LAB — COMPREHENSIVE METABOLIC PANEL
Albumin: 3.7 (ref 3.5–5.0)
Calcium: 8.7 (ref 8.7–10.7)
eGFR: 42

## 2023-05-06 LAB — CBC AND DIFFERENTIAL
HCT: 36 (ref 36–46)
Hemoglobin: 11.2 — AB (ref 12.0–16.0)
WBC: 7.3

## 2023-05-06 LAB — CBC: RBC: 4.08 (ref 3.87–5.11)

## 2023-05-07 LAB — LAB REPORT - SCANNED
Albumin, Urine POC: 8.9
Albumin/Creatinine Ratio, Urine, POC: 9
Creatinine, POC: 104.4 mg/dL
EGFR: 42

## 2023-05-15 ENCOUNTER — Encounter: Payer: Self-pay | Admitting: Internal Medicine

## 2023-05-16 ENCOUNTER — Ambulatory Visit: Admitting: Obstetrics & Gynecology

## 2023-05-16 ENCOUNTER — Encounter: Payer: Self-pay | Admitting: Obstetrics & Gynecology

## 2023-05-16 VITALS — BP 157/74 | HR 80 | Wt 300.0 lb

## 2023-05-16 DIAGNOSIS — B977 Papillomavirus as the cause of diseases classified elsewhere: Secondary | ICD-10-CM | POA: Diagnosis not present

## 2023-05-16 NOTE — Progress Notes (Signed)
    Colposcopy Procedure Note:    Colposcopy Procedure Note  Indications:   2025 normal cytology positive HR HPV negative 16, 18/45  2019 ASCCP recommendation:  Smoker:  No. New sexual partner:  No.  : time frame:  No.  History of abnormal Pap: yes  Procedure Details  The risks and benefits of the procedure and Written informed consent obtained.  Speculum placed in vagina and excellent visualization of cervix achieved, cervix swabbed x 3 with acetic acid solution.  Findings: Adequate colposcopy is noted today.  Cervix: no visible lesions, no mosaicism, no punctation, and no abnormal vasculature; SCJ visualized 360 degrees without lesions and no biopsies taken. Vaginal inspection: normal without visible lesions. Vulvar colposcopy: vulvar colposcopy not performed.  Specimens: none  Complications: none.  Colposcopic Impression: Normal colposcopy  Plan(Based on 2019 ASCCP recommendations) Follow up HPV based cytology 1 year

## 2023-05-23 ENCOUNTER — Encounter: Payer: Self-pay | Admitting: Internal Medicine

## 2023-05-23 ENCOUNTER — Ambulatory Visit (INDEPENDENT_AMBULATORY_CARE_PROVIDER_SITE_OTHER): Payer: Medicare Other | Admitting: Internal Medicine

## 2023-05-23 VITALS — BP 124/70 | HR 97 | Ht 70.0 in | Wt 297.8 lb

## 2023-05-23 DIAGNOSIS — Z794 Long term (current) use of insulin: Secondary | ICD-10-CM | POA: Diagnosis not present

## 2023-05-23 DIAGNOSIS — E049 Nontoxic goiter, unspecified: Secondary | ICD-10-CM

## 2023-05-23 DIAGNOSIS — Z7985 Long-term (current) use of injectable non-insulin antidiabetic drugs: Secondary | ICD-10-CM

## 2023-05-23 DIAGNOSIS — E1159 Type 2 diabetes mellitus with other circulatory complications: Secondary | ICD-10-CM | POA: Diagnosis not present

## 2023-05-23 DIAGNOSIS — E1169 Type 2 diabetes mellitus with other specified complication: Secondary | ICD-10-CM | POA: Diagnosis not present

## 2023-05-23 DIAGNOSIS — E1165 Type 2 diabetes mellitus with hyperglycemia: Secondary | ICD-10-CM

## 2023-05-23 DIAGNOSIS — E785 Hyperlipidemia, unspecified: Secondary | ICD-10-CM

## 2023-05-23 LAB — POCT GLYCOSYLATED HEMOGLOBIN (HGB A1C): Hemoglobin A1C: 7.9 % — AB (ref 4.0–5.6)

## 2023-05-23 NOTE — Patient Instructions (Addendum)
 Please use the following regimen: - Basaglar 20 units in a.m. and 20 units at night - Novolog dose: 20-25 units 15 min before every meal and 5-10 units before a snack - Ozempic 1 mg weekly  Please return in 3-4 months.

## 2023-05-23 NOTE — Addendum Note (Signed)
 Addended by: Pollie Meyer on: 05/23/2023 02:40 PM   Modules accepted: Orders

## 2023-05-23 NOTE — Progress Notes (Addendum)
 Subjective:     Patient ID: Savannah Crawford, female   DOB: 09-Aug-1955, 67 y.o.   MRN: [id number]  HPI [name] is a 68 y.o. woman returning for f/u for DM2, insulin-dependent, uncontrolled, with complications (CAD, CKD, diabetes neuropathy, diabetic retinopathy, PVD, amputated toe-R), multinodular goiter. Last visit 3 months ago.  Interim history: No increased urination, blurry vision, nausea, chest pain.   She had N/V/D this am (food poisoning).  She is feeling better.  DM2: Reviewed HbA1c levels: Lab Results  Component Value Date   HGBA1C 8.0 (A) 01/31/2023   HGBA1C 7.5 (A) 04/26/2022   HGBA1C 7.1 (A) 01/15/2022  09/14/2021: HbA1c 7.9%  She is on: - Basaglar 40 units in a.m. and 30-40 units at night  >> 25 >> Lantus 20 units 2x a day - Novolog dose: (15-25) - (15-20) - (15-20) units before meals (but forgets to take it 15 minutes before meals)  >> 20-25 units before meals - Novolog Sliding scale: - 150- 165: + 1 unit  - 166- 180: + 2 units  - 181- 195: + 3 units  - 196- 210: + 4 units  - >210: + 5 units  Take NovoLog 15 minutes before every meal. - Ozempic 0.5 mg weekly- started 03/2020 >> 1 mg weekly  I again suggested Ozempic 0.5 mg weekly in the past, also: 11/2018 >> did not start as she did not want to add another med... In 03/2019, she did try to start it but this was expensive. She was on metformin in the past, but stopped 2/2 CKD.  Tried Victoza before >> cannot remember SE.  Checks sugars >4x a day with the CGM (with Receiver):  Prev.:  Previously:   Lowest: 68 >> .Marland Kitchen.  100 >> 78 >> 64 >> 60s. Highest sugar 212 >> 178 >> 290 (missed ins.) >> 284 >>upper 200s.  Meter: Freestyle Lite  + CKD-sees nephrology: Lab Results  Component Value Date   BUN 21 05/06/2023   CREATININE 1.4 (A) 05/06/2023   Lab Results  Component Value Date   MICRALBCREAT 9 05/07/2023   MICRALBCREAT 4 10/30/2022   MICRALBCREAT 1.3 02/26/2022   MICRALBCREAT 0.6 08/29/2021    MICRALBCREAT 2.4 09/14/2013   MICRALBCREAT 2.9 11/13/2012   MICRALBCREAT 3.2 07/08/2012  On lisinopril 10.  + HL; Latest lipids: Lab Results  Component Value Date   CHOL 125 02/26/2022   HDL 45.50 02/26/2022   LDLCALC 66 02/26/2022   TRIG 69.0 02/26/2022   CHOLHDL 3 02/26/2022  On Lipitor 20.  Last eye exam 03/21/2023: + DR, + cataract reportedly.   + Stable numbness and tingling in her feet.  She sees podiatry.  She has a history of left third toe ulcer. Last podiatry visit was with Dr. Romualdo Bolk 12/24/2022.  She has a history of skin graft fourth toe ulcer.  She had normal ABIs 03/28/2023.  MNG:  Thyroid ultrasound (05/24/2022): Parenchymal Echotexture: Moderately heterogeneous  Isthmus: 1.9 cm  Right lobe: 5.0 x 2.9 x 2.7 cm  Left lobe: 4.1 x 1.5 x 1.8 cm  _________________________________________________________   Estimated total number of nodules >/= 1 cm: 2 _________________________________________________________   Nodule # 1:  Location: Isthmus; superior  Maximum size: 1.6 cm; Other 2 dimensions: 1.5 x 0.9 cm  Composition: solid/almost completely solid (2)  Echogenicity: isoechoic (1) *Given size (>/= 1.5 - 2.4 cm) and appearance, a follow-up ultrasound in 1 year should be considered based on TI-RADS criteria.  _________________________________________________________   Nodule # 2:  Location: Right;  inferior  Maximum size: 4.8 cm; Other 2 dimensions: 2.6 x 4.6 cm  Composition: mixed cystic and solid (1)  Echogenicity: isoechoic (1) This nodule does NOT meet TI-RADS criteria for biopsy or dedicated follow-up.  ____________________________________________________   Nodule 3: 0.6 cm spongiform nodule in the inferior left thyroid lobe does not meet criteria for imaging surveillance or FNA.   IMPRESSION: Nodule 1 (TI-RADS 3), measuring 1.6 cm, located in the isthmus meets criteria for imaging follow-up. Annual ultrasound surveillance is recommended until 5 years of  stability is documented.  She denies: - feeling nodules in neck - hoarseness - dysphagia - choking -only occasionally, with drier foods  She has a history of nonobstructive CAD, with positive stress test but a negative cardiac cath 2012, and also has diastolic heart failure. She has HTN, venous stasis, vitamin D deficiency.  She denies family history of medullary thyroid cancer and no personal history of pancreatitis.  Review of Systems + see HPI  I reviewed pt's medications, allergies, PMH, social hx, family hx, and changes were documented in the history of present illness. Otherwise, unchanged from my initial visit note.  Past Medical History:  Diagnosis Date   Anemia of chronic disease 11/17/2012   not on meds at this time   At risk for heart disease - +stres test 2012 followed by [facility], [place] with neg cardiac cath 03/2010 (non-obstructive CAD) per review of records, echo 02/2010 normal LVF 01/15/2012   Chronic kidney disease (CKD), stage IV (severe) (HCC) 01/09/2012   Diabetes mellitus with renal manifestations, uncontrolled    on meds   Diabetic neuropathy (HCC) 01/09/2012   Diastolic heart failure - stage 1 per review of echo results from 02/2010 01/15/2012   GERD (gastroesophageal reflux disease)    OTC meds for tx   Hyperlipidemia    on meds   Hypertension    on meds   Obesity    Peripheral arterial disease? Eval by vascular 2013 with no LE PAD and advised to stop pletal    Severe obesity (BMI >= 40) (HCC) 11/13/2012   Toe ulcer, right (HCC)    Ulcer of toe of left foot (HCC)    treated by [facility] per patient   Venous stasis of lower extremity 03/10/2012   Past Surgical History:  Procedure Laterality Date   COLONOSCOPY  2013/2014   [place]- records not available    REFRACTIVE SURGERY Bilateral    TOE AMPUTATION Right 2008   RIGHT    Social History   Socioeconomic History   Marital status: Married    Spouse name: Not on file   Number of children:  2   Years of education: Not on file   Highest education level: GED or equivalent  Occupational History   Not on file  Tobacco Use   Smoking status: Former    Current packs/day: 0.00    Types: Cigarettes    Quit date: 02/20/1999    Years since quitting: 24.2   Smokeless tobacco: Never  Vaping Use   Vaping status: Never Used  Substance and Sexual Activity   Alcohol use: Yes    Alcohol/week: 4.0 standard drinks of alcohol    Types: 4 Standard drinks or equivalent per week    Comment: Occasional Drinks   Drug use: Not Currently   Sexual activity: Not on file  Other Topics Concern   Not on file  Social History Narrative   Are you right handed or left handed? Right Handed  Are you currently employed ? Yes. Work 2-3 days at [employer] is your current occupation?   Do you live at home alone? No   Who lives with you? Husband, daughter, and granddaughter    What type of home do you live in: 1 story or 2 story? One story home        Social Drivers of Health   Financial Resource Strain: Low Risk  (02/28/2023)   Overall Financial Resource Strain (CARDIA)    Difficulty of Paying Living Expenses: Not hard at all  Food Insecurity: No Food Insecurity (02/28/2023)   Hunger Vital Sign    Worried About Running Out of Food in the Last Year: Never true    Ran Out of Food in the Last Year: Never true  Transportation Needs: No Transportation Needs (02/28/2023)   PRAPARE - Administrator, Civil Service (Medical): No    Lack of Transportation (Non-Medical): No  Physical Activity: Sufficiently Active (02/28/2023)   Exercise Vital Sign    Days of Exercise per Week: 3 days    Minutes of Exercise per Session: 60 min  Stress: No Stress Concern Present (02/28/2023)   [employer] of Occupational Health - Occupational Stress Questionnaire    Feeling of Stress : Not at all  Social Connections: Moderately Integrated (02/28/2023)   Social Connection and Isolation Panel [NHANES]     Frequency of Communication with Friends and Family: More than three times a week    Frequency of Social Gatherings with Friends and Family: Three times a week    Attends Religious Services: More than 4 times per year    Active Member of Clubs or Organizations: No    Attends Banker Meetings: Never    Marital Status: Married  Catering manager Violence: Not At Risk (02/28/2023)   Humiliation, Afraid, Rape, and Kick questionnaire    Fear of Current or Ex-Partner: No    Emotionally Abused: No    Physically Abused: No    Sexually Abused: No   Current Outpatient Medications on File Prior to Visit  Medication Sig Dispense Refill   atorvastatin (LIPITOR) 20 MG tablet TAKE 1 TABLET BY MOUTH EVERY DAY 90 tablet 0   Blood Glucose Monitoring Suppl (ACCU-CHEK GUIDE ME) w/Device KIT Use as instructed to check blood sugar 4 times daily 1 kit 0   Continuous Glucose Sensor (DEXCOM G7 SENSOR) MISC Use as instructed to check blood sugar. Change every 10 days 9 each 3   furosemide (LASIX) 40 MG tablet TAKE 1 TABLET BY MOUTH TWICE A DAY 180 tablet 1   insulin aspart (NOVOLOG FLEXPEN) 100 UNIT/ML FlexPen INJECT UP TO 90 UNITS DAILY AS INSTRUCTED 60 mL 3   insulin glargine (LANTUS SOLOSTAR) 100 UNIT/ML Solostar Pen Inject 25 Units into the skin 2 (two) times daily. 45 mL 3   Insulin Pen Needle (B-D UF III MINI PEN NEEDLES) 31G X 5 MM MISC Use to inject medication 5 times a day 1000 each 3   lisinopril (ZESTRIL) 10 MG tablet TAKE 1 TABLET BY MOUTH EVERY DAY 90 tablet 1   metoprolol succinate (TOPROL-XL) 25 MG 24 hr tablet TAKE 1 TABLET BY MOUTH EVERY DAY 90 tablet 1   mupirocin ointment (BACTROBAN) 2 % SMARTSIG:1 Application Topical 2-3 Times Daily     Semaglutide, 1 MG/DOSE, (OZEMPIC, 1 MG/DOSE,) 4 MG/3ML SOPN Inject 1 mg under skin weekly 9 mL 3   Vitamin D, Ergocalciferol, (DRISDOL) 1.25 MG (50000 UNIT) CAPS capsule  Take 1 capsule by mouth once a week.     No current facility-administered  medications on file prior to visit.   No Known Allergies Family History  Problem Relation Age of Onset   Diabetes Mother    Hyperlipidemia Mother    Hypertension Mother    Heart attack Mother    Stroke Mother    Diabetes Sister    Breast cancer Sister 105   Hypertension Brother    Diabetes Sister    Diabetes Sister    Obesity Other    Hypertension Other    Hyperlipidemia Other    Stroke Other    Heart attack Other    Colon polyps Neg Hx    Colon cancer Neg Hx    Esophageal cancer Neg Hx    Rectal cancer Neg Hx    Stomach cancer Neg Hx    Objective:   Physical Exam BP 124/70   Pulse 97   Ht 5\' 10"  (1.778 m)   Wt 297 lb 12.8 oz (135.1 kg)   SpO2 97%   BMI 42.73 kg/m   Wt Readings from Last 10 Encounters:  05/23/23 297 lb 12.8 oz (135.1 kg)  05/16/23 300 lb (136.1 kg)  04/04/23 295 lb 12.8 oz (134.2 kg)  01/31/23 296 lb 3.2 oz (134.4 kg)  11/29/22 297 lb 8 oz (134.9 kg)  05/10/22 299 lb (135.6 kg)  04/26/22 297 lb 3.2 oz (134.8 kg)  03/14/22 295 lb (133.8 kg)  02/22/22 299 lb 1.6 oz (135.7 kg)  01/15/22 294 lb 9.6 oz (133.6 kg)   Constitutional: overweight, in NAD Eyes:  no exophthalmos ENT:  + thyromegaly, no cervical lymphadenopathy Cardiovascular: tachycardia, RR, No MRG, + B LE edema Respiratory: CTA B Musculoskeletal: no deformities Skin: no rashes Neurological: no tremor with outstretched hands  Assessment:     1. DM2, insulin-dependent, uncontrolled, with complications - nonobstructive CAD - CKD - diabetes neuropathy - diabetic retinopathy - PVD - R amputated toe  2. HL  3. Obesity class 3  4.  Isthmic thyroid nodule    Plan:     1. Pt with history of uncontrolled type 2 diabetes, on basal/bolus insulin regimen and weekly GLP-1 receptor agonist, with previously improved diabetes control but the higher HbA1c at last visit, at 8.0%, increased from 7.5%.  At that time, sugars were fluctuating within the upper range of the target interval  overnight and then increasing significantly after breakfast and then again after dinner.  Upon questioning, she was not remembering to take her NovoLog 15 minutes before meals and we discussed that this is quite important.  We discussed about possibly increasing the dose of Ozempic, but she did have some nausea and preferred not to increase the dose. -She is seeing Dr. Kathrene Bongo with nephrology - she suggested suggest an SGLT2 inhibitor if the patient developed proteinuria.  Latest ACR was normal, 9, GFR 42. CGM interpretation: -At today's visit, we reviewed her CGM downloads: It appears that 49% of values are in target range (goal >70%), while 51% are higher than 180 (goal <25%), and 0% are lower than 70 (goal <4%).  The calculated average blood sugar is 181.  The projected HbA1c for the next 3 months (GMI) is 7.6%. -Reviewing the CGM trends, sugars are improving overnight but increasing after she wakes up.  Upon questioning, she is missing the first dose of Basaglar of the day because of blood sugars at target in the morning.  We discussed that she absolutely needs to take  it even if the sugars are at goal.  I believe that this will improve her blood sugars later in the day, but they are still increasing significantly after the majority of the meals, and upon questioning, she is missing NovoLog injections before meals.  I strongly advised her to inject this before every meal, and to leave 15 minutes between the injections and the meal.  For now, I did not suggest to increase the doses of NovoLog.  Will also continue the same Ozempic dose. -  I advised her to: Patient Instructions  Please use the following regimen: - Basaglar 20 units in a.m. and 20 units at night - Novolog dose: 20-25 units 15 min before every meal and 5-10 units before a snack - Ozempic 1 mg weekly  Please return in 3-4 months.  - we checked her HbA1c: 7.9% (lower) - advised to check sugars at different times of the day - 4x a  day, rotating check times - advised for yearly eye exams >> she is UTD - return to clinic in 3-4 months  2. HL -Latest lipid panel was reviewed from 02/2022: LDL above our target of less than 55, otherwise fractions at goal: Lab Results  Component Value Date   CHOL 125 02/26/2022   HDL 45.50 02/26/2022   LDLCALC 66 02/26/2022   TRIG 69.0 02/26/2022   CHOLHDL 3 02/26/2022  -She continues on Lipitor 20 mg daily without side effects -She is due for another lipid panel-will check today -fasting  3.  Obesity class III -She continues on Ozempic which should also help with weight loss -She lost 3 pounds before last visit -she gained 3 pounds in the last 4 months.  4.  Thyroid nodules -She had a mass palpated in the central lower neck at last visit. -We checked a thyroid ultrasound (05/24/2022) and this showed an isthmic nodule, without worrisome features, but which required a 1 year follow-up. She also had a subcentimeter, spongiform, thyroid nodule in the inferior left lobe, for which no intervention or follow-up was needed. -Her neck compression symptoms resolved -Will check a new ultrasound now to follow-up on the isthmic nodule.  Orders Placed This Encounter  Procedures   US THYROID   Lipid Panel w/reflex Direct LDL   Carlus Pavlov, MD PhD [facility] Endocrinology

## 2023-05-24 ENCOUNTER — Encounter: Payer: Self-pay | Admitting: Internal Medicine

## 2023-05-24 LAB — LIPID PANEL W/REFLEX DIRECT LDL
Cholesterol: 124 mg/dL (ref <200)
HDL: 51 mg/dL (ref >=50)
LDL Cholesterol (Calc): 56 mg/dL
Non-HDL Cholesterol (Calc): 73 mg/dL (ref <130)
Total CHOL/HDL Ratio: 2.4 (calc) (ref <5.0)
Triglycerides: 83 mg/dL (ref <150)

## 2023-05-27 ENCOUNTER — Ambulatory Visit
Admission: RE | Admit: 2023-05-27 | Discharge: 2023-05-27 | Disposition: A | Discharge status: Home / Self Care | Source: Ambulatory Visit | Attending: Internal Medicine | Admitting: Internal Medicine

## 2023-05-27 DIAGNOSIS — E049 Nontoxic goiter, unspecified: Secondary | ICD-10-CM

## 2023-06-05 ENCOUNTER — Encounter: Payer: Self-pay | Admitting: Internal Medicine

## 2023-07-08 ENCOUNTER — Encounter: Payer: Self-pay | Admitting: Internal Medicine

## 2023-07-08 ENCOUNTER — Ambulatory Visit: Admitting: Internal Medicine

## 2023-07-08 VITALS — BP 130/62 | HR 71 | Temp 97.8°F | Wt 279.4 lb

## 2023-07-08 DIAGNOSIS — M25562 Pain in left knee: Secondary | ICD-10-CM

## 2023-07-08 DIAGNOSIS — J069 Acute upper respiratory infection, unspecified: Secondary | ICD-10-CM

## 2023-07-08 DIAGNOSIS — I5032 Chronic diastolic (congestive) heart failure: Secondary | ICD-10-CM | POA: Diagnosis not present

## 2023-07-08 DIAGNOSIS — E1159 Type 2 diabetes mellitus with other circulatory complications: Secondary | ICD-10-CM

## 2023-07-08 DIAGNOSIS — G8929 Other chronic pain: Secondary | ICD-10-CM

## 2023-07-08 DIAGNOSIS — I152 Hypertension secondary to endocrine disorders: Secondary | ICD-10-CM

## 2023-07-08 MED ORDER — LISINOPRIL 10 MG PO TABS: 10.0000 mg | ORAL_TABLET | Freq: Every day | ORAL | 1 refills | Status: DC

## 2023-07-08 MED ORDER — FUROSEMIDE 40 MG PO TABS: 40.0000 mg | ORAL_TABLET | Freq: Two times a day (BID) | ORAL | 1 refills | Status: DC

## 2023-07-08 NOTE — Progress Notes (Signed)
 Established Patient Office Visit     CC/Reason for Visit: Discuss acute concerns  HPI: Savannah Crawford is a 68 y.o. female who is coming in today for the above mentioned reasons.  Here today to discuss 2 issues:  1.  Has been having URI symptoms for the past 2 weeks.  Mainly runny nose, congestion, postnasal drip, slight cough.  No sick contacts.  2.  Left knee pain that is chronic.  Worse with climbing up steps or walking prolonged periods of time.   Past Medical/Surgical History: Past Medical History:  Diagnosis Date   Anemia of chronic disease 11/17/2012   not on meds at this time   At risk for heart disease - +stres test 2012 followed by [facility], [place] with neg cardiac cath 03/2010 (non-obstructive CAD) per review of records, echo 02/2010 normal LVF 01/15/2012   Chronic kidney disease (CKD), stage IV (severe) (HCC) 01/09/2012   Diabetes mellitus with renal manifestations, uncontrolled    on meds   Diabetic neuropathy (HCC) 01/09/2012   Diastolic heart failure - stage 1 per review of echo results from 02/2010 01/15/2012   GERD (gastroesophageal reflux disease)    OTC meds for tx   Hyperlipidemia    on meds   Hypertension    on meds   Obesity    Peripheral arterial disease? Eval by vascular 2013 with no LE PAD and advised to stop pletal    Severe obesity (BMI >= 40) (HCC) 11/13/2012   Toe ulcer, right (HCC)    Ulcer of toe of left foot (HCC)    treated by [facility] per patient   Venous stasis of lower extremity 03/10/2012    Past Surgical History:  Procedure Laterality Date   COLONOSCOPY  2013/2014   [place]- records not available    REFRACTIVE SURGERY Bilateral    TOE AMPUTATION Right 2008   RIGHT     Social History:  reports that she quit smoking about 24 years ago. Her smoking use included cigarettes. She has never used smokeless tobacco. She reports current alcohol use of about 4.0 standard drinks of alcohol per week. She reports that she  does not currently use drugs.  Allergies: No Known Allergies  Family History:  Family History  Problem Relation Age of Onset   Diabetes Mother    Hyperlipidemia Mother    Hypertension Mother    Heart attack Mother    Stroke Mother    Diabetes Sister    Breast cancer Sister 52   Hypertension Brother    Diabetes Sister    Diabetes Sister    Obesity Other    Hypertension Other    Hyperlipidemia Other    Stroke Other    Heart attack Other    Colon polyps Neg Hx    Colon cancer Neg Hx    Esophageal cancer Neg Hx    Rectal cancer Neg Hx    Stomach cancer Neg Hx      Current Outpatient Medications:    atorvastatin  (LIPITOR) 20 MG tablet, TAKE 1 TABLET BY MOUTH EVERY DAY, Disp: 90 tablet, Rfl: 0   Blood Glucose Monitoring Suppl (ACCU-CHEK GUIDE ME) w/Device KIT, Use as instructed to check blood sugar 4 times daily, Disp: 1 kit, Rfl: 0   Continuous Glucose Sensor (DEXCOM G7 SENSOR) MISC, Use as instructed to check blood sugar. Change every 10 days, Disp: 9 each, Rfl: 3   insulin  aspart (NOVOLOG  FLEXPEN) 100 UNIT/ML FlexPen, INJECT UP TO 90  UNITS DAILY AS INSTRUCTED, Disp: 60 mL, Rfl: 3   insulin  glargine (LANTUS  SOLOSTAR) 100 UNIT/ML Solostar Pen, Inject 25 Units into the skin 2 (two) times daily., Disp: 45 mL, Rfl: 3   Insulin  Pen Needle (B-D UF III MINI PEN NEEDLES) 31G X 5 MM MISC, Use to inject medication 5 times a day, Disp: 1000 each, Rfl: 3   metoprolol  succinate (TOPROL -XL) 25 MG 24 hr tablet, TAKE 1 TABLET BY MOUTH EVERY DAY, Disp: 90 tablet, Rfl: 1   mupirocin ointment (BACTROBAN) 2 %, SMARTSIG:1 Application Topical 2-3 Times Daily, Disp: , Rfl:    Semaglutide , 1 MG/DOSE, (OZEMPIC , 1 MG/DOSE,) 4 MG/3ML SOPN, Inject 1 mg under skin weekly, Disp: 9 mL, Rfl: 3   Vitamin D, Ergocalciferol, (DRISDOL) 1.25 MG (50000 UNIT) CAPS capsule, Take 1 capsule by mouth once a week., Disp: , Rfl:    furosemide  (LASIX ) 40 MG tablet, Take 1 tablet (40 mg total) by mouth 2 (two) times daily.,  Disp: 180 tablet, Rfl: 1   lisinopril  (ZESTRIL ) 10 MG tablet, Take 1 tablet (10 mg total) by mouth daily., Disp: 90 tablet, Rfl: 1  Review of Systems:  Negative unless indicated in HPI.   Physical Exam: Vitals:   07/08/23 0919  BP: 130/62  Pulse: 71  Temp: 97.8 F (36.6 C)  TempSrc: Oral  SpO2: 96%  Weight: 279 lb 6.4 oz (126.7 kg)    Body mass index is 40.09 kg/m.   Physical Exam Vitals reviewed.  Constitutional:      Appearance: Normal appearance. She is obese.  HENT:     Right Ear: Tympanic membrane, ear canal and external ear normal.     Left Ear: Tympanic membrane, ear canal and external ear normal.     Mouth/Throat:     Mouth: Mucous membranes are moist.     Pharynx: Oropharynx is clear.  Eyes:     Conjunctiva/sclera: Conjunctivae normal.  Cardiovascular:     Rate and Rhythm: Normal rate and regular rhythm.  Pulmonary:     Effort: Pulmonary effort is normal.     Breath sounds: Normal breath sounds.  Neurological:     Mental Status: She is alert.      Impression and Plan:  Upper respiratory tract infection, unspecified type  Chronic diastolic heart failure (HCC) -     Furosemide ; Take 1 tablet (40 mg total) by mouth 2 (two) times daily.  Dispense: 180 tablet; Refill: 1  Hypertension associated with diabetes (HCC) -     Lisinopril ; Take 1 tablet (10 mg total) by mouth daily.  Dispense: 90 tablet; Refill: 1  Chronic pain of left knee -     Ambulatory referral to Orthopedic Surgery   -Given exam findings, PNA, pharyngitis, ear infection are not likely, hence abx have not been prescribed. -Have advised rest, fluids, OTC antihistamines, cough suppressants and mucinex. -RTC if no improvement in 10-14 days. - Refer to orthopedics for left knee pain, suspect osteoarthritis.  Time spent:31 minutes reviewing chart, interviewing and examining patient and formulating plan of care.     Marguerita Shih, MD [facility]

## 2023-07-18 ENCOUNTER — Other Ambulatory Visit: Payer: Self-pay | Admitting: Internal Medicine

## 2023-07-18 ENCOUNTER — Ambulatory Visit: Attending: Internal Medicine

## 2023-07-18 DIAGNOSIS — M25562 Pain in left knee: Secondary | ICD-10-CM | POA: Diagnosis present

## 2023-07-18 DIAGNOSIS — G8929 Other chronic pain: Secondary | ICD-10-CM | POA: Diagnosis present

## 2023-07-18 DIAGNOSIS — M25561 Pain in right knee: Secondary | ICD-10-CM | POA: Diagnosis present

## 2023-07-18 NOTE — Therapy (Signed)
 OUTPATIENT PHYSICAL THERAPY LOWER EXTREMITY EVALUATION   Patient Name: Savannah Crawford MRN: [id number] DOB:1955-06-15, 68 y.o., female Today's Date: 07/18/2023  END OF SESSION:  PT End of Session - 07/18/23 0931     Visit Number 1    Number of Visits 12    Date for PT Re-Evaluation 09/13/23    PT Start Time 0932    PT Stop Time 1010    PT Time Calculation (min) 38 min    Activity Tolerance Patient tolerated treatment well    Behavior During Therapy [facility] for tasks assessed/performed             Past Medical History:  Diagnosis Date   Anemia of chronic disease 11/17/2012   not on meds at this time   At risk for heart disease - +stres test 2012 followed by [facility], [place] with neg cardiac cath 03/2010 (non-obstructive CAD) per review of records, echo 02/2010 normal LVF 01/15/2012   Chronic kidney disease (CKD), stage IV (severe) (HCC) 01/09/2012   Diabetes mellitus with renal manifestations, uncontrolled    on meds   Diabetic neuropathy (HCC) 01/09/2012   Diastolic heart failure - stage 1 per review of echo results from 02/2010 01/15/2012   GERD (gastroesophageal reflux disease)    OTC meds for tx   Hyperlipidemia    on meds   Hypertension    on meds   Obesity    Peripheral arterial disease? Eval by vascular 2013 with no LE PAD and advised to stop pletal    Severe obesity (BMI >= 40) (HCC) 11/13/2012   Toe ulcer, right (HCC)    Ulcer of toe of left foot (HCC)    treated by [facility] per patient   Venous stasis of lower extremity 03/10/2012   Past Surgical History:  Procedure Laterality Date   COL[place]OSCOPY  2013/2014   [place]- records [place] available    REFRACTIVE SURGERY Bilateral    TOE AMPUTATION Right 2008   RIGHT    Patient Active Problem List   Diagnosis Date Noted   Hyperlipidemia associated with type 2 diabetes mellitus (HCC) 11/18/2017   Type 2 diabetes mellitus with stage 3 chronic kidney disease, with long-term current use of insulin  (HCC)  03/15/2015   Anemia of chronic disease 11/17/2012   Severe obesity (BMI >= 40) (HCC) 11/13/2012   Venous stasis of lower extremity 03/10/2012   At risk for heart disease - +stres test 2012 followed by [facility], [place] with neg cardiac cath 03/2010 (non-obstructive CAD) per review of records, echo 02/2010 normal LVF 01/15/2012   Diastolic heart failure - stage 1 per review of echo results from 02/2010 01/15/2012   Hyperlipidemia 01/09/2012   Diabetic neuropathy (HCC) 01/09/2012   Hypertension associated with diabetes (HCC) 01/09/2012   Chronic kidney disease (CKD), stage IV (severe) (HCC) 01/09/2012   REFERRING PROVIDER: Zilphia Hilt, Charyl Coppersmith, MD   REFERRING DIAG: Chronic pain of left knee   THERAPY DIAG:  Chronic pain of both knees  Rationale for Evaluation and Treatment: Rehabilitation  ONSET DATE: 6 months  SUBJECTIVE:   SUBJECTIVE STATEMENT: Patient reports that her knees have been bothering her for about 6 months now with no known cause. Her pain and stiffness was primarily in the left knee, but now it will go back and forth between the left and right knee. She has noticed that her balance is "off" as she cannot walk a straight line. She notes that her stiffness is worst in the  morning, but it will improve some after moving about 15-20 minutes.  PERTINENT HISTORY: Hypertension, diabetes, diabetic neuropathy, chronic kidney disease, and obesity PAIN:  Are you having pain? Yes: NPRS scale: Current: 3/10 Best: 0/10 Worst: 4-5/10 Pain location: both knees Pain description: stiffness Aggravating factors: walking, stairs Relieving factors: medication  PRECAUTIONS: None  RED FLAGS: None   WEIGHT BEARING RESTRICTIONS: No  FALLS:  Has patient fallen in last 6 months? No, but my balance is off  LIVING ENVIRONMENT: Lives with: lives with their spouse Lives in: House/apartment Stairs: Yes: External: 6 steps; can reach both; varies between step to and reciprocal  pattern depending on how her knees feel  Has following equipment at home: None  OCCUPATION: [employer]; primarily standing, 9-10 hour shift   PLOF: Independent  PATIENT GOALS: be able to do aerobics and return to her dance classes  NEXT MD VISIT: none scheduled  OBJECTIVE:  Note: Objective measures were completed at Evaluation unless otherwise noted.  PATIENT SURVEYS:  LEFS  Extreme difficulty/unable (0), Quite a bit of difficulty (1), Moderate difficulty (2), Little difficulty (3), No difficulty (4) Survey date:  07/18/23  Any of your usual work, housework or school activities 4  2. Usual hobbies, recreational or sporting activities 3  3. Getting into/out of the bath 4  4. Walking between rooms 4  5. Putting on socks/shoes 4  6. Squatting  3  7. Lifting an object, like a bag of groceries from the floor 4  8. Performing light activities around your home 4  9. Performing heavy activities around your home 4  10. Getting into/out of a car 3  11. Walking 2 blocks 3  12. Walking 1 mile 3  13. Going up/down 10 stairs (1 flight) 3  14. Standing for 1 hour 3  15.  sitting for 1 hour 3  16. Running on even ground 3  17. Running on uneven ground 3  18. Making sharp turns while running fast 3  19. Hopping  3  20. Rolling over in bed 4  Score total:  68/80     COGNITION: Overall cognitive status: Within functional limits for tasks assessed     SENSATION: Patient reports intermittent tingling in both feet secondary to diabetic neuropathy.   PALPATION: TTP: bilateral medial and lateral tibiofemoral joint lines, quadriceps, hip adductors, hamstrings, and right IT band  LOWER EXTREMITY ROM:  Active ROM Right eval Left eval  Hip flexion    Hip extension    Hip abduction    Hip adduction    Hip internal rotation    Hip external rotation    Knee flexion 117 123  Knee extension 6 1  Ankle dorsiflexion    Ankle plantarflexion    Ankle inversion    Ankle eversion      (Blank rows = not tested)  LOWER EXTREMITY MMT:  MMT Right eval Left eval  Hip flexion 4/5; pressure 4/5; pressure  Hip extension    Hip abduction    Hip adduction    Hip internal rotation    Hip external rotation    Knee flexion 4/5; familiar pain 4/5  Knee extension 4/5; slight pain 4/5; familiar pain  Ankle dorsiflexion 4+/5 4+/5  Ankle plantarflexion    Ankle inversion    Ankle eversion     (Blank rows = not tested)  GAIT: Assistive device utilized: None Level of assistance: Complete Independence Comments: decreased gait speed and stride length  TREATMENT DATE:                                     07/18/23 EXERCISE LOG  Exercise Repetitions and Resistance Comments  SLR 5 reps    Bridge 5 reps   LAQ    Standing HS curl     Mini squat      Blank cell = exercise not performed today   PATIENT EDUCATION:  Education details: HEP, POC, healing, arthritis, anatomy, prognosis, benefits of exercise, and goals for physical therapy Person educated: Patient Education method: Explanation Education comprehension: verbalized understanding  HOME EXERCISE PROGRAM: [id number]  ASSESSMENT:  CLINICAL IMPRESSION: Patient is a 68 y.o. female who was seen today for physical therapy evaluation and treatment for bilateral knee pain with signs and symptoms consistent with osteoarthritis. She presented with moderate pain severity and irritability with palpation to both knees and quadriceps manual muscle testing reproducing her familiar symptoms. Recommend that she continue with skilled physical therapy to address her impairments to maximize her functional mobility.    OBJECTIVE IMPAIRMENTS: Abnormal gait, decreased activity tolerance, decreased mobility, difficulty walking, decreased ROM, decreased strength, impaired tone, and pain.   ACTIVITY LIMITATIONS: stairs,  transfers, and locomotion level  PARTICIPATION LIMITATIONS: cleaning, shopping, community activity, and occupation  PERSONAL FACTORS: Past/current experiences, Time since onset of injury/illness/exacerbation, and 3+ comorbidities: Hypertension, diabetes, diabetic neuropathy, chronic kidney disease, and obesity are also affecting patient's functional outcome.   REHAB POTENTIAL: Good  CLINICAL DECISION MAKING: Evolving/moderate complexity  EVALUATION COMPLEXITY: Moderate   GOALS: Goals reviewed with patient? Yes  SHORT TERM GOALS: Target date: 08/08/23 Patient will be independent with her initial HEP.  Baseline: Goal status: INITIAL  2.  Patient will improve her bilateral quadriceps strength to at least 4+/5 for improved function with transfers.  Baseline:  Goal status: INITIAL  LONG TERM GOALS: Target date: 08/29/23  Patient will be independent with her advanced HEP.  Baseline:  Goal status: INITIAL  2.  Patient will be able to complete her daily activities without her familiar pain exceeding 2/10. Baseline:  Goal status: INITIAL  3.  Patient will be able to independently transfer from sitting to standing without UE support for improved independence.  Baseline:  Goal status: INITIAL  4.  Patient will be able to return to her exercise classes without being limited by her familiar knee symptoms.  Baseline:  Goal status: INITIAL  PLAN:  PT FREQUENCY: 1-2x/week  PT DURATION: 6 weeks  PLANNED INTERVENTIONS: 47829- PT Re-evaluation, 97750- Physical Performance Testing, 97110-Therapeutic exercises, 97530- Therapeutic activity, [id number]- Neuromuscular re-education, 97535- Self Care, 56213- Manual therapy, [phone]- Gait training, [phone]- Electrical stimulation (unattended), 97016- Vasopneumatic device, Patient/Family education, Balance training, Stair training, Joint mobilization, Cryotherapy, and Moist heat  PLAN FOR NEXT SESSION: Nustep, lower extremity strengthening, balance  interventions, and modalities as needed   Lane Pinon, PT 07/18/2023, 6:28 PM

## 2023-07-22 ENCOUNTER — Encounter

## 2023-07-25 ENCOUNTER — Encounter: Admitting: *Deleted

## 2023-07-25 ENCOUNTER — Ambulatory Visit: Attending: Internal Medicine

## 2023-07-25 DIAGNOSIS — M25562 Pain in left knee: Secondary | ICD-10-CM | POA: Insufficient documentation

## 2023-07-25 DIAGNOSIS — M25561 Pain in right knee: Secondary | ICD-10-CM | POA: Insufficient documentation

## 2023-07-25 DIAGNOSIS — G8929 Other chronic pain: Secondary | ICD-10-CM | POA: Insufficient documentation

## 2023-07-25 NOTE — Therapy (Signed)
 OUTPATIENT PHYSICAL THERAPY LOWER EXTREMITY TREATMENT   Patient Name: Savannah Crawford MRN: [id number] DOB:[birth year], 68 y.o., female Today's Date: 07/25/2023  END OF SESSION:  PT End of Session - 07/25/23 1659     Visit Number 2    Number of Visits 12    Date for PT Re-Evaluation 09/13/23    PT Start Time 1645    PT Stop Time 1739    PT Time Calculation (min) 54 min    Activity Tolerance Patient tolerated treatment well    Behavior During Therapy [facility] for tasks assessed/performed             Past Medical History:  Diagnosis Date   Anemia of chronic disease 11/17/2012   not on meds at this time   At risk for heart disease - +stres test 2012 followed by [facility], [place] with neg cardiac cath 03/2010 (non-obstructive CAD) per review of records, echo 02/2010 normal LVF 01/15/2012   Chronic kidney disease (CKD), stage IV (severe) (HCC) 01/09/2012   Diabetes mellitus with renal manifestations, uncontrolled    on meds   Diabetic neuropathy (HCC) 01/09/2012   Diastolic heart failure - stage 1 per review of echo results from 02/2010 01/15/2012   GERD (gastroesophageal reflux disease)    OTC meds for tx   Hyperlipidemia    on meds   Hypertension    on meds   Obesity    Peripheral arterial disease? Eval by vascular 2013 with no LE PAD and advised to stop pletal    Severe obesity (BMI >= 40) (HCC) 11/13/2012   Toe ulcer, right (HCC)    Ulcer of toe of left foot (HCC)    treated by [facility] per patient   Venous stasis of lower extremity 03/10/2012   Past Surgical History:  Procedure Laterality Date   COLONOSCOPY  2013/2014   [place]- [place] available    REFRACTIVE SURGERY Bilateral    TOE AMPUTATION Right 2008   RIGHT    Patient Active Problem List   Diagnosis Date Noted   Hyperlipidemia associated with type 2 diabetes mellitus (HCC) 11/18/2017   Type 2 diabetes mellitus with stage 3 chronic kidney disease, with long-term current use of insulin  (HCC)  03/15/2015   Anemia of chronic disease 11/17/2012   Severe obesity (BMI >= 40) (HCC) 11/13/2012   Venous stasis of lower extremity 03/10/2012   At risk for heart disease - +stres test 2012 followed by [facility], [place] with neg cardiac cath 03/2010 (non-obstructive CAD) per review of records, echo 02/2010 normal LVF 01/15/2012   Diastolic heart failure - stage 1 per review of echo results from 02/2010 01/15/2012   Hyperlipidemia 01/09/2012   Diabetic neuropathy (HCC) 01/09/2012   Hypertension associated with diabetes (HCC) 01/09/2012   Chronic kidney disease (CKD), stage IV (severe) (HCC) 01/09/2012   REFERRING PROVIDER: Zilphia Hilt, Charyl Coppersmith, MD   REFERRING DIAG: Chronic pain of left knee   THERAPY DIAG:  Chronic pain of both knees  Rationale for Evaluation and Treatment: Rehabilitation  ONSET DATE: 6 months  SUBJECTIVE:   SUBJECTIVE STATEMENT: Pt reports 3/10 right knee pain today.   PERTINENT HISTORY: Hypertension, diabetes, diabetic neuropathy, chronic kidney disease, and obesity PAIN:  Are you having pain? Yes: NPRS scale: 3/10 Pain location: right knee Pain description: stiffness Aggravating factors: walking, stairs Relieving factors: medication  PRECAUTIONS: None  RED FLAGS: None   WEIGHT BEARING RESTRICTIONS: No  FALLS:  Has patient fallen in last 6  months? No, but my balance is off  LIVING ENVIRONMENT: Lives with: lives with their spouse Lives in: House/apartment Stairs: Yes: External: 6 steps; can reach both; varies between step to and reciprocal pattern depending on how her knees feel  Has following equipment at home: None  OCCUPATION: [employer]; primarily standing, 9-10 hour shift   PLOF: Independent  PATIENT GOALS: be able to do aerobics and return to her dance classes  NEXT MD VISIT: none scheduled  OBJECTIVE:  Note: Objective measures were completed at Evaluation unless otherwise noted.  PATIENT SURVEYS:  LEFS  Extreme  difficulty/unable (0), Quite a bit of difficulty (1), Moderate difficulty (2), Little difficulty (3), No difficulty (4) Survey date:  07/18/23  Any of your usual work, housework or school activities 4  2. Usual hobbies, recreational or sporting activities 3  3. Getting into/out of the bath 4  4. Walking between rooms 4  5. Putting on socks/shoes 4  6. Squatting  3  7. Lifting an object, like a bag of groceries from the floor 4  8. Performing light activities around your home 4  9. Performing heavy activities around your home 4  10. Getting into/out of a car 3  11. Walking 2 blocks 3  12. Walking 1 mile 3  13. Going up/down 10 stairs (1 flight) 3  14. Standing for 1 hour 3  15.  sitting for 1 hour 3  16. Running on even ground 3  17. Running on uneven ground 3  18. Making sharp turns while running fast 3  19. Hopping  3  20. Rolling over in bed 4  Score total:  68/80     COGNITION: Overall cognitive status: Within functional limits for tasks assessed     SENSATION: Patient reports intermittent tingling in both feet secondary to diabetic neuropathy.   PALPATION: TTP: bilateral medial and lateral tibiofemoral joint lines, quadriceps, hip adductors, hamstrings, and right IT band  LOWER EXTREMITY ROM:  Active ROM Right eval Left eval  Hip flexion    Hip extension    Hip abduction    Hip adduction    Hip internal rotation    Hip external rotation    Knee flexion 117 123  Knee extension 6 1  Ankle dorsiflexion    Ankle plantarflexion    Ankle inversion    Ankle eversion     (Blank rows = not tested)  LOWER EXTREMITY MMT:  MMT Right eval Left eval  Hip flexion 4/5; pressure 4/5; pressure  Hip extension    Hip abduction    Hip adduction    Hip internal rotation    Hip external rotation    Knee flexion 4/5; familiar pain 4/5  Knee extension 4/5; slight pain 4/5; familiar pain  Ankle dorsiflexion 4+/5 4+/5  Ankle plantarflexion    Ankle inversion    Ankle  eversion     (Blank rows = not tested)  GAIT: Assistive device utilized: None Level of assistance: Complete Independence Comments: decreased gait speed and stride length  TREATMENT DATE:    07/25/23                                  EXERCISE LOG  Exercise Repetitions and Resistance Comments  Nustep Lvl 3 x 15 mins   Rockerboard 3 mins   Lunges    Forward Step Ups 4" step x 3 mins bil   LAQs 2# x 20 reps bil   Seated Marches 2# x 20 reps bil   Seated Hip Abduction Red x 3 mins   Seated Ham Curls Red x 20 reps bil    Blank cell = exercise not performed today   Modalities  Date:  Vaso: Knee, 34 degrees; low pressure, 15 mins, Pain                                     07/18/23 EXERCISE LOG  Exercise Repetitions and Resistance Comments  SLR 5 reps    Bridge 5 reps   LAQ    Standing HS curl     Mini squat      Blank cell = exercise not performed today   PATIENT EDUCATION:  Education details: HEP, POC, healing, arthritis, anatomy, prognosis, benefits of exercise, and goals for physical therapy Person educated: Patient Education method: Explanation Education comprehension: verbalized understanding  HOME EXERCISE PROGRAM: [id number]  ASSESSMENT:  CLINICAL IMPRESSION: Pt arrives for today's treatment session reporting 3/10 right knee pain.  Pt introduced to nustep today for warm up without discomfort.  Pt introduced to several standing and seated exercises today to increase strength and function.  Pt requiring min cues for proper technique and posture with all newly added exercises.  Normal responses to vaso noted upon removal.  Pt reported decreased pain at completion of today's treatment session.   OBJECTIVE IMPAIRMENTS: Abnormal gait, decreased activity tolerance, decreased mobility, difficulty walking, decreased ROM, decreased strength, impaired tone,  and pain.   ACTIVITY LIMITATIONS: stairs, transfers, and locomotion level  PARTICIPATION LIMITATIONS: cleaning, shopping, community activity, and occupation  PERSONAL FACTORS: Past/current experiences, Time since onset of injury/illness/exacerbation, and 3+ comorbidities: Hypertension, diabetes, diabetic neuropathy, chronic kidney disease, and obesity are also affecting patient's functional outcome.   REHAB POTENTIAL: Good  CLINICAL DECISION MAKING: Evolving/moderate complexity  EVALUATION COMPLEXITY: Moderate   GOALS: Goals reviewed with patient? Yes  SHORT TERM GOALS: Target date: 08/08/23 Patient will be independent with her initial HEP.  Baseline: Goal status: INITIAL  2.  Patient will improve her bilateral quadriceps strength to at least 4+/5 for improved function with transfers.  Baseline:  Goal status: INITIAL  LONG TERM GOALS: Target date: 08/29/23  Patient will be independent with her advanced HEP.  Baseline:  Goal status: INITIAL  2.  Patient will be able to complete her daily activities without her familiar pain exceeding 2/10. Baseline:  Goal status: INITIAL  3.  Patient will be able to independently transfer from sitting to standing without UE support for improved independence.  Baseline:  Goal status: INITIAL  4.  Patient will be able to return to her exercise classes without being limited by her familiar knee symptoms.  Baseline:  Goal status: INITIAL  PLAN:  PT FREQUENCY: 1-2x/week  PT DURATION: 6 weeks  PLANNED INTERVENTIONS: [id number]- PT Re-evaluation, 97750- Physical Performance Testing, 97110-Therapeutic exercises, 97530- Therapeutic activity, [id number]- Neuromuscular re-education, 97535- Self Care, 54098- Manual therapy, [id number]- Gait  training, [phone]- Electrical stimulation (unattended), [phone]- Vasopneumatic device, Patient/Family education, Balance training, Stair training, Joint mobilization, Cryotherapy, and Moist heat  PLAN FOR NEXT SESSION: Nustep,  lower extremity strengthening, balance interventions, and modalities as needed   Deryl Flora, PTA 07/25/2023, 5:40 PM

## 2023-07-29 ENCOUNTER — Ambulatory Visit

## 2023-07-29 DIAGNOSIS — G8929 Other chronic pain: Secondary | ICD-10-CM

## 2023-07-29 DIAGNOSIS — M25561 Pain in right knee: Secondary | ICD-10-CM | POA: Diagnosis not present

## 2023-07-29 NOTE — Therapy (Signed)
 OUTPATIENT PHYSICAL THERAPY LOWER EXTREMITY TREATMENT   Patient Name: Savannah Crawford MRN: [id number] DOB:22-Jan-1956, 68 y.o., female Today's Date: 07/29/2023  END OF SESSION:  PT End of Session - 07/29/23 1424     Visit Number 3    Number of Visits 12    Date for PT Re-Evaluation 09/13/23    PT Start Time 1422    PT Stop Time 1518    PT Time Calculation (min) 56 min    Activity Tolerance Patient tolerated treatment well    Behavior During Therapy [facility] for tasks assessed/performed             Past Medical History:  Diagnosis Date   Anemia of chronic disease 11/17/2012   not on meds at this time   At risk for heart disease - +stres test 2012 followed by [facility], [place] with neg cardiac cath 03/2010 (non-obstructive CAD) per review of records, echo 02/2010 normal LVF 01/15/2012   Chronic kidney disease (CKD), stage IV (severe) (HCC) 01/09/2012   Diabetes mellitus with renal manifestations, uncontrolled    on meds   Diabetic neuropathy (HCC) 01/09/2012   Diastolic heart failure - stage 1 per review of echo results from 02/2010 01/15/2012   GERD (gastroesophageal reflux disease)    OTC meds for tx   Hyperlipidemia    on meds   Hypertension    on meds   Obesity    Peripheral arterial disease? Eval by vascular 2013 with no LE PAD and advised to stop pletal    Severe obesity (BMI >= 40) (HCC) 11/13/2012   Toe ulcer, right (HCC)    Ulcer of toe of left foot (HCC)    treated by [facility] per patient   Venous stasis of lower extremity 03/10/2012   Past Surgical History:  Procedure Laterality Date   [place]OSCOPY  2013/2014   [place]- records [place] available    REFRACTIVE SURGERY Bilateral    TOE AMPUTATION Right 2008   RIGHT    Patient Active Problem List   Diagnosis Date Noted   Hyperlipidemia associated with type 2 diabetes mellitus (HCC) 11/18/2017   Type 2 diabetes mellitus with stage 3 chronic kidney disease, with long-term current use of insulin  (HCC)  03/15/2015   Anemia of chronic disease 11/17/2012   Severe obesity (BMI >= 40) (HCC) 11/13/2012   Venous stasis of lower extremity 03/10/2012   At risk for heart disease - +stres test 2012 followed by [facility], [place] with neg cardiac cath 03/2010 (non-obstructive CAD) per review of records, echo 02/2010 normal LVF 01/15/2012   Diastolic heart failure - stage 1 per review of echo results from 02/2010 01/15/2012   Hyperlipidemia 01/09/2012   Diabetic neuropathy (HCC) 01/09/2012   Hypertension associated with diabetes (HCC) 01/09/2012   Chronic kidney disease (CKD), stage IV (severe) (HCC) 01/09/2012   REFERRING PROVIDER: Zilphia Hilt, Charyl Coppersmith, MD   REFERRING DIAG: Chronic pain of left knee   THERAPY DIAG:  Chronic pain of both knees  Rationale for Evaluation and Treatment: Rehabilitation  ONSET DATE: 6 months  SUBJECTIVE:   SUBJECTIVE STATEMENT: Pt reports right knee stiffness today, but denies any pain.   PERTINENT HISTORY: Hypertension, diabetes, diabetic neuropathy, chronic kidney disease, and obesity PAIN:  Are you having pain? No  PRECAUTIONS: None  RED FLAGS: None   WEIGHT BEARING RESTRICTIONS: No  FALLS:  Has patient fallen in last 6 months? No, but my balance is off  LIVING ENVIRONMENT: Lives with: lives with  their spouse Lives in: House/apartment Stairs: Yes: External: 6 steps; can reach both; varies between step to and reciprocal pattern depending on how her knees feel  Has following equipment at home: None  OCCUPATION: [employer]; primarily standing, 9-10 hour shift   PLOF: Independent  PATIENT GOALS: be able to do aerobics and return to her dance classes  NEXT MD VISIT: none scheduled  OBJECTIVE:  Note: Objective measures were completed at Evaluation unless otherwise noted.  PATIENT SURVEYS:  LEFS  Extreme difficulty/unable (0), Quite a bit of difficulty (1), Moderate difficulty (2), Little difficulty (3), No difficulty (4) Survey  date:  07/18/23  Any of your usual work, housework or school activities 4  2. Usual hobbies, recreational or sporting activities 3  3. Getting into/out of the bath 4  4. Walking between rooms 4  5. Putting on socks/shoes 4  6. Squatting  3  7. Lifting an object, like a bag of groceries from the floor 4  8. Performing light activities around your home 4  9. Performing heavy activities around your home 4  10. Getting into/out of a car 3  11. Walking 2 blocks 3  12. Walking 1 mile 3  13. Going up/down 10 stairs (1 flight) 3  14. Standing for 1 hour 3  15.  sitting for 1 hour 3  16. Running on even ground 3  17. Running on uneven ground 3  18. Making sharp turns while running fast 3  19. Hopping  3  20. Rolling over in bed 4  Score total:  68/80     COGNITION: Overall cognitive status: Within functional limits for tasks assessed     SENSATION: Patient reports intermittent tingling in both feet secondary to diabetic neuropathy.   PALPATION: TTP: bilateral medial and lateral tibiofemoral joint lines, quadriceps, hip adductors, hamstrings, and right IT band  LOWER EXTREMITY ROM:  Active ROM Right eval Left eval  Hip flexion    Hip extension    Hip abduction    Hip adduction    Hip internal rotation    Hip external rotation    Knee flexion 117 123  Knee extension 6 1  Ankle dorsiflexion    Ankle plantarflexion    Ankle inversion    Ankle eversion     (Blank rows = not tested)  LOWER EXTREMITY MMT:  MMT Right eval Left eval  Hip flexion 4/5; pressure 4/5; pressure  Hip extension    Hip abduction    Hip adduction    Hip internal rotation    Hip external rotation    Knee flexion 4/5; familiar pain 4/5  Knee extension 4/5; slight pain 4/5; familiar pain  Ankle dorsiflexion 4+/5 4+/5  Ankle plantarflexion    Ankle inversion    Ankle eversion     (Blank rows = not tested)  GAIT: Assistive device utilized: None Level of assistance: Complete  Independence Comments: decreased gait speed and stride length  TREATMENT DATE:    07/29/23                                  EXERCISE LOG  Exercise Repetitions and Resistance Comments  Nustep Lvl 3 x 15 mins   Rockerboard 4 mins   Lunges 14" box x 1 min (left hip pain)   Forward Step Ups 4" step x 3 mins bil   LAQs 2# x 25 reps bil   Seated Marches 2# x 25 reps bil   Seated Hip Abduction Red x 3 mins   Seated Ham Curls Red x 30 reps bil    Blank cell = exercise not performed today   Modalities  Date:  Vaso: Knee, 34 degrees; low pressure, 15 mins, Pain                                     07/18/23 EXERCISE LOG  Exercise Repetitions and Resistance Comments  SLR 5 reps    Bridge 5 reps   LAQ    Standing HS curl     Mini squat      Blank cell = exercise not performed today   PATIENT EDUCATION:  Education details: HEP, POC, healing, arthritis, anatomy, prognosis, benefits of exercise, and goals for physical therapy Person educated: Patient Education method: Explanation Education comprehension: verbalized understanding  HOME EXERCISE PROGRAM: [id number]  ASSESSMENT:  CLINICAL IMPRESSION: Pt arrives for today's treatment session denying any pain, but does report right knee stiffness.  Pt only able to tolerate 1 min of forward lunges due to left hip pain.  Pt able to tolerate increased time or reps with all previously performed exercises.  Normal responses to vaso noted upon removal.  Pt denied any pain and reported decreased stiffness at completion of today's treatment session.   OBJECTIVE IMPAIRMENTS: Abnormal gait, decreased activity tolerance, decreased mobility, difficulty walking, decreased ROM, decreased strength, impaired tone, and pain.   ACTIVITY LIMITATIONS: stairs, transfers, and locomotion level  PARTICIPATION LIMITATIONS: cleaning,  shopping, community activity, and occupation  PERSONAL FACTORS: Past/current experiences, Time since onset of injury/illness/exacerbation, and 3+ comorbidities: Hypertension, diabetes, diabetic neuropathy, chronic kidney disease, and obesity are also affecting patient's functional outcome.   REHAB POTENTIAL: Good  CLINICAL DECISION MAKING: Evolving/moderate complexity  EVALUATION COMPLEXITY: Moderate   GOALS: Goals reviewed with patient? Yes  SHORT TERM GOALS: Target date: 08/08/23 Patient will be independent with her initial HEP.  Baseline: Goal status: INITIAL  2.  Patient will improve her bilateral quadriceps strength to at least 4+/5 for improved function with transfers.  Baseline:  Goal status: INITIAL  LONG TERM GOALS: Target date: 08/29/23  Patient will be independent with her advanced HEP.  Baseline:  Goal status: INITIAL  2.  Patient will be able to complete her daily activities without her familiar pain exceeding 2/10. Baseline:  Goal status: INITIAL  3.  Patient will be able to independently transfer from sitting to standing without UE support for improved independence.  Baseline:  Goal status: INITIAL  4.  Patient will be able to return to her exercise classes without being limited by her familiar knee symptoms.  Baseline:  Goal status: INITIAL  PLAN:  PT FREQUENCY: 1-2x/week  PT DURATION: 6 weeks  PLANNED INTERVENTIONS: 97164- PT Re-evaluation, 97750- Physical Performance Testing, 97110-Therapeutic exercises, 97530- Therapeutic activity, [id number]- Neuromuscular re-education, 97535- Self Care, 91478-  Manual therapy, [id number]- Gait training, [phone]- Electrical stimulation (unattended), [phone]- Vasopneumatic device, Patient/Family education, Balance training, Stair training, Joint mobilization, Cryotherapy, and Moist heat  PLAN FOR NEXT SESSION: Nustep, lower extremity strengthening, balance interventions, and modalities as needed   Deryl Flora,  PTA 07/29/2023, 4:46 PM

## 2023-08-01 ENCOUNTER — Ambulatory Visit

## 2023-08-01 DIAGNOSIS — G8929 Other chronic pain: Secondary | ICD-10-CM

## 2023-08-01 DIAGNOSIS — M25561 Pain in right knee: Secondary | ICD-10-CM | POA: Diagnosis not present

## 2023-08-01 NOTE — Therapy (Signed)
 OUTPATIENT PHYSICAL THERAPY LOWER EXTREMITY TREATMENT   Patient Name: Savannah Crawford MRN: [id number] DOB:June 04, 1955, 68 y.o., female Today's Date: 08/01/2023  END OF SESSION:  PT End of Session - 08/01/23 1438     Visit Number 4    Number of Visits 12    Date for PT Re-Evaluation 09/13/23    PT Start Time 1430    PT Stop Time 1525    PT Time Calculation (min) 55 min    Activity Tolerance Patient tolerated treatment well    Behavior During Therapy [facility] for tasks assessed/performed          Past Medical History:  Diagnosis Date   Anemia of chronic disease 11/17/2012   not on meds at this time   At risk for heart disease - +stres test 2012 followed by [facility], [place] with neg cardiac cath 03/2010 (non-obstructive CAD) per review of records, echo 02/2010 normal LVF 01/15/2012   Chronic kidney disease (CKD), stage IV (severe) (HCC) 01/09/2012   Diabetes mellitus with renal manifestations, uncontrolled    on meds   Diabetic neuropathy (HCC) 01/09/2012   Diastolic heart failure - stage 1 per review of echo results from 02/2010 01/15/2012   GERD (gastroesophageal reflux disease)    OTC meds for tx   Hyperlipidemia    on meds   Hypertension    on meds   Obesity    Peripheral arterial disease? Eval by vascular 2013 with no LE PAD and advised to stop pletal    Severe obesity (BMI >= 40) (HCC) 11/13/2012   Toe ulcer, right (HCC)    Ulcer of toe of left foot (HCC)    treated by [facility] per patient   Venous stasis of lower extremity 03/10/2012   Past Surgical History:  Procedure Laterality Date   COLONOSCOPY  2013/2014   [place]- records not available    REFRACTIVE SURGERY Bilateral    TOE AMPUTATION Right 2008   RIGHT    Patient Active Problem List   Diagnosis Date Noted   Hyperlipidemia associated with type 2 diabetes mellitus (HCC) 11/18/2017   Type 2 diabetes mellitus with stage 3 chronic kidney disease, with long-term current use of insulin  (HCC)  03/15/2015   Anemia of chronic disease 11/17/2012   Severe obesity (BMI >= 40) (HCC) 11/13/2012   Venous stasis of lower extremity 03/10/2012   At risk for heart disease - +stres test 2012 followed by [employer], [place] with neg cardiac cath 03/2010 (non-obstructive CAD) per review of records, echo 02/2010 normal LVF 01/15/2012   Diastolic heart failure - stage 1 per review of echo results from 02/2010 01/15/2012   Hyperlipidemia 01/09/2012   Diabetic neuropathy (HCC) 01/09/2012   Hypertension associated with diabetes (HCC) 01/09/2012   Chronic kidney disease (CKD), stage IV (severe) (HCC) 01/09/2012   REFERRING PROVIDER: Zilphia Hilt, Charyl Coppersmith, MD   REFERRING DIAG: Chronic pain of left knee   THERAPY DIAG:  Chronic pain of both knees  Rationale for Evaluation and Treatment: Rehabilitation  ONSET DATE: 6 months  SUBJECTIVE:   SUBJECTIVE STATEMENT: Pt reports 2/10 bil knee pain and stiffness today.  Pt reports that she went line dancing since her last appointment.    PERTINENT HISTORY: Hypertension, diabetes, diabetic neuropathy, chronic kidney disease, and obesity PAIN:  Are you having pain? No  PRECAUTIONS: None  RED FLAGS: None   WEIGHT BEARING RESTRICTIONS: No  FALLS:  Has patient fallen in last 6 months? No, but my balance  is off  LIVING ENVIRONMENT: Lives with: lives with their spouse Lives in: House/apartment Stairs: Yes: External: 6 steps; can reach both; varies between step to and reciprocal pattern depending on how her knees feel  Has following equipment at home: None  OCCUPATION: [employer]; primarily standing, 9-10 hour shift   PLOF: Independent  PATIENT GOALS: be able to do aerobics and return to her dance classes  NEXT MD VISIT: none scheduled  OBJECTIVE:  Note: Objective measures were completed at Evaluation unless otherwise noted.  PATIENT SURVEYS:  LEFS  Extreme difficulty/unable (0), Quite a bit of difficulty (1), Moderate  difficulty (2), Little difficulty (3), No difficulty (4) Survey date:  07/18/23  Any of your usual work, housework or school activities 4  2. Usual hobbies, recreational or sporting activities 3  3. Getting into/out of the bath 4  4. Walking between rooms 4  5. Putting on socks/shoes 4  6. Squatting  3  7. Lifting an object, like a bag of groceries from the floor 4  8. Performing light activities around your home 4  9. Performing heavy activities around your home 4  10. Getting into/out of a car 3  11. Walking 2 blocks 3  12. Walking 1 mile 3  13. Going up/down 10 stairs (1 flight) 3  14. Standing for 1 hour 3  15.  sitting for 1 hour 3  16. Running on even ground 3  17. Running on uneven ground 3  18. Making sharp turns while running fast 3  19. Hopping  3  20. Rolling over in bed 4  Score total:  68/80     COGNITION: Overall cognitive status: Within functional limits for tasks assessed     SENSATION: Patient reports intermittent tingling in both feet secondary to diabetic neuropathy.   PALPATION: TTP: bilateral medial and lateral tibiofemoral joint lines, quadriceps, hip adductors, hamstrings, and right IT band  LOWER EXTREMITY ROM:  Active ROM Right eval Left eval  Hip flexion    Hip extension    Hip abduction    Hip adduction    Hip internal rotation    Hip external rotation    Knee flexion 117 123  Knee extension 6 1  Ankle dorsiflexion    Ankle plantarflexion    Ankle inversion    Ankle eversion     (Blank rows = not tested)  LOWER EXTREMITY MMT:  MMT Right eval Left eval  Hip flexion 4/5; pressure 4/5; pressure  Hip extension    Hip abduction    Hip adduction    Hip internal rotation    Hip external rotation    Knee flexion 4/5; familiar pain 4/5  Knee extension 4/5; slight pain 4/5; familiar pain  Ankle dorsiflexion 4+/5 4+/5  Ankle plantarflexion    Ankle inversion    Ankle eversion     (Blank rows = not tested)  GAIT: Assistive device  utilized: None Level of assistance: Complete Independence Comments: decreased gait speed and stride length  TREATMENT DATE:    08/01/23                                  EXERCISE LOG  Exercise Repetitions and Resistance Comments  Nustep Lvl 3 x 15 mins   Rockerboard 5 mins   Lunges    Forward Step Ups 6 step x 3 mins bil   LAQs 2# x 30 reps bil   Seated Marches 2# x 30 reps bil   Seated Hip Abduction Red x 3 mins   Seated Ham Curls Green x 30 reps bil    Blank cell = exercise not performed today   Modalities  Date:  Vaso: Knee, 34 degrees; low pressure, 15 mins, Pain                                     07/18/23 EXERCISE LOG  Exercise Repetitions and Resistance Comments  SLR 5 reps    Bridge 5 reps   LAQ    Standing HS curl     Mini squat      Blank cell = exercise not performed today   PATIENT EDUCATION:  Education details: HEP, POC, healing, arthritis, anatomy, prognosis, benefits of exercise, and goals for physical therapy Person educated: Patient Education method: Explanation Education comprehension: verbalized understanding  HOME EXERCISE PROGRAM: [id number]  ASSESSMENT:  CLINICAL IMPRESSION: Pt arrives for today's treatment session reporting 2/10 bil knee pain and stiffness today. Pt states that she went line dancing on Monday with minimal discomfort.   Pt able to tolerate increased time or reps with all previously performed exercises. Normal responses to vaso noted upon removal.  Pt denied any pain at completion of today's treatment session.   OBJECTIVE IMPAIRMENTS: Abnormal gait, decreased activity tolerance, decreased mobility, difficulty walking, decreased ROM, decreased strength, impaired tone, and pain.   ACTIVITY LIMITATIONS: stairs, transfers, and locomotion level  PARTICIPATION LIMITATIONS: cleaning, shopping, community  activity, and occupation  PERSONAL FACTORS: Past/current experiences, Time since onset of injury/illness/exacerbation, and 3+ comorbidities: Hypertension, diabetes, diabetic neuropathy, chronic kidney disease, and obesity are also affecting patient's functional outcome.   REHAB POTENTIAL: Good  CLINICAL DECISION MAKING: Evolving/moderate complexity  EVALUATION COMPLEXITY: Moderate   GOALS: Goals reviewed with patient? Yes  SHORT TERM GOALS: Target date: 08/08/23 Patient will be independent with her initial HEP.  Baseline: Goal status: INITIAL  2.  Patient will improve her bilateral quadriceps strength to at least 4+/5 for improved function with transfers.  Baseline:  Goal status: INITIAL  LONG TERM GOALS: Target date: 08/29/23  Patient will be independent with her advanced HEP.  Baseline:  Goal status: INITIAL  2.  Patient will be able to complete her daily activities without her familiar pain exceeding 2/10. Baseline:  Goal status: INITIAL  3.  Patient will be able to independently transfer from sitting to standing without UE support for improved independence.  Baseline:  Goal status: INITIAL  4.  Patient will be able to return to her exercise classes without being limited by her familiar knee symptoms.  Baseline:  Goal status: INITIAL  PLAN:  PT FREQUENCY: 1-2x/week  PT DURATION: 6 weeks  PLANNED INTERVENTIONS: 97164- PT Re-evaluation, 97750- Physical Performance Testing, 97110-Therapeutic exercises, 97530- Therapeutic activity, [id number]- Neuromuscular re-education, 97535- Self Care, 47829- Manual therapy, [id number]- Gait training, [phone]- Electrical stimulation (unattended), [phone]- Vasopneumatic device, Patient/Family education, Balance training,  Stair training, Joint mobilization, Cryotherapy, and Moist heat  PLAN FOR NEXT SESSION: Nustep, lower extremity strengthening, balance interventions, and modalities as needed   Deryl Flora, PTA 08/01/2023, 4:10 PM

## 2023-08-04 ENCOUNTER — Other Ambulatory Visit: Payer: Self-pay | Admitting: Internal Medicine

## 2023-08-04 DIAGNOSIS — I152 Hypertension secondary to endocrine disorders: Secondary | ICD-10-CM

## 2023-08-05 ENCOUNTER — Ambulatory Visit

## 2023-08-05 DIAGNOSIS — M25561 Pain in right knee: Secondary | ICD-10-CM | POA: Diagnosis not present

## 2023-08-05 DIAGNOSIS — G8929 Other chronic pain: Secondary | ICD-10-CM

## 2023-08-05 NOTE — Therapy (Signed)
 OUTPATIENT PHYSICAL THERAPY LOWER EXTREMITY TREATMENT   Patient Name: Savannah Crawford MRN: [id number] DOB:Jul 12, 1955, 68 y.o., female Today's Date: 08/05/2023  END OF SESSION:  PT End of Session - 08/05/23 1521     Visit Number 5    Number of Visits 12    Date for PT Re-Evaluation 09/13/23    PT Start Time 1515    PT Stop Time 1614    PT Time Calculation (min) 59 min    Activity Tolerance Patient tolerated treatment well    Behavior During Therapy [facility] for tasks assessed/performed           Past Medical History:  Diagnosis Date   Anemia of chronic disease 11/17/2012   not on meds at this time   At risk for heart disease - +stres test 2012 followed by [facility], [place] with neg cardiac cath 03/2010 (non-obstructive CAD) per review of records, echo 02/2010 normal LVF 01/15/2012   Chronic kidney disease (CKD), stage IV (severe) (HCC) 01/09/2012   Diabetes mellitus with renal manifestations, uncontrolled    on meds   Diabetic neuropathy (HCC) 01/09/2012   Diastolic heart failure - stage 1 per review of echo results from 02/2010 01/15/2012   GERD (gastroesophageal reflux disease)    OTC meds for tx   Hyperlipidemia    on meds   Hypertension    on meds   Obesity    Peripheral arterial disease? Eval by vascular 2013 with no LE PAD and advised to stop pletal    Severe obesity (BMI >= 40) (HCC) 11/13/2012   Toe ulcer, right (HCC)    Ulcer of toe of left foot (HCC)    treated by [facility] per patient   Venous stasis of lower extremity 03/10/2012   Past Surgical History:  Procedure Laterality Date   COLONOSCOPY  2013/2014   [place]- records not available    REFRACTIVE SURGERY Bilateral    TOE AMPUTATION Right 2008   RIGHT    Patient Active Problem List   Diagnosis Date Noted   Hyperlipidemia associated with type 2 diabetes mellitus (HCC) 11/18/2017   Type 2 diabetes mellitus with stage 3 chronic kidney disease, with long-term current use of insulin  (HCC)  03/15/2015   Anemia of chronic disease 11/17/2012   Severe obesity (BMI >= 40) (HCC) 11/13/2012   Venous stasis of lower extremity 03/10/2012   At risk for heart disease - +stres test 2012 followed by [employer], [place] with neg cardiac cath 03/2010 (non-obstructive CAD) per review of records, echo 02/2010 normal LVF 01/15/2012   Diastolic heart failure - stage 1 per review of echo results from 02/2010 01/15/2012   Hyperlipidemia 01/09/2012   Diabetic neuropathy (HCC) 01/09/2012   Hypertension associated with diabetes (HCC) 01/09/2012   Chronic kidney disease (CKD), stage IV (severe) (HCC) 01/09/2012   REFERRING PROVIDER: Zilphia Hilt, Charyl Coppersmith, MD   REFERRING DIAG: Chronic pain of left knee   THERAPY DIAG:  Chronic pain of both knees  Rationale for Evaluation and Treatment: Rehabilitation  ONSET DATE: 6 months  SUBJECTIVE:   SUBJECTIVE STATEMENT: Patient reports that she feels good today as she is not hurting any.   PERTINENT HISTORY: Hypertension, diabetes, diabetic neuropathy, chronic kidney disease, and obesity PAIN:  Are you having pain? No  PRECAUTIONS: None  RED FLAGS: None   WEIGHT BEARING RESTRICTIONS: No  FALLS:  Has patient fallen in last 6 months? No, but my balance is off  LIVING ENVIRONMENT: Lives with: lives  with their spouse Lives in: House/apartment Stairs: Yes: External: 6 steps; can reach both; varies between step to and reciprocal pattern depending on how her knees feel  Has following equipment at home: None  OCCUPATION: [employer]; primarily standing, 9-10 hour shift   PLOF: Independent  PATIENT GOALS: be able to do aerobics and return to her dance classes  NEXT MD VISIT: none scheduled  OBJECTIVE:  Note: Objective measures were completed at Evaluation unless otherwise noted.  PATIENT SURVEYS:  LEFS  Extreme difficulty/unable (0), Quite a bit of difficulty (1), Moderate difficulty (2), Little difficulty (3), No difficulty  (4) Survey date:  07/18/23  Any of your usual work, housework or school activities 4  2. Usual hobbies, recreational or sporting activities 3  3. Getting into/out of the bath 4  4. Walking between rooms 4  5. Putting on socks/shoes 4  6. Squatting  3  7. Lifting an object, like a bag of groceries from the floor 4  8. Performing light activities around your home 4  9. Performing heavy activities around your home 4  10. Getting into/out of a car 3  11. Walking 2 blocks 3  12. Walking 1 mile 3  13. Going up/down 10 stairs (1 flight) 3  14. Standing for 1 hour 3  15.  sitting for 1 hour 3  16. Running on even ground 3  17. Running on uneven ground 3  18. Making sharp turns while running fast 3  19. Hopping  3  20. Rolling over in bed 4  Score total:  68/80     COGNITION: Overall cognitive status: Within functional limits for tasks assessed     SENSATION: Patient reports intermittent tingling in both feet secondary to diabetic neuropathy.   PALPATION: TTP: bilateral medial and lateral tibiofemoral joint lines, quadriceps, hip adductors, hamstrings, and right IT band  LOWER EXTREMITY ROM:  Active ROM Right eval Left eval  Hip flexion    Hip extension    Hip abduction    Hip adduction    Hip internal rotation    Hip external rotation    Knee flexion 117 123  Knee extension 6 1  Ankle dorsiflexion    Ankle plantarflexion    Ankle inversion    Ankle eversion     (Blank rows = not tested)  LOWER EXTREMITY MMT:  MMT Right eval Left eval  Hip flexion 4/5; pressure 4/5; pressure  Hip extension    Hip abduction    Hip adduction    Hip internal rotation    Hip external rotation    Knee flexion 4/5; familiar pain 4/5  Knee extension 4/5; slight pain 4/5; familiar pain  Ankle dorsiflexion 4+/5 4+/5  Ankle plantarflexion    Ankle inversion    Ankle eversion     (Blank rows = not tested)  GAIT: Assistive device utilized: None Level of assistance: Complete  Independence Comments: decreased gait speed and stride length  TREATMENT DATE:                                     08/05/23 EXERCISE LOG  Exercise Repetitions and Resistance Comments  Nustep  L4 x 16 minutes   Mini squat  20 reps   Standing HS curl  2.5 minutes  Alternating LE  LAQ 2# x 35 reps each    Seated marching  2# x 35 reps each    Rocker board  5 minutes   Lunges onto step  6 step x 3 minutes    Blank cell = exercise not performed today  Modalities: no adverse reaction to today's modalities  Date:  Vaso: Knee, 34 degrees; low pressure, 15 mins, Pain  08/01/23                                  EXERCISE LOG  Exercise Repetitions and Resistance Comments  Nustep Lvl 3 x 15 mins   Rockerboard 5 mins   Lunges    Forward Step Ups 6 step x 3 mins bil   LAQs 2# x 30 reps bil   Seated Marches 2# x 30 reps bil   Seated Hip Abduction Red x 3 mins   Seated Ham Curls Green x 30 reps bil    Blank cell = exercise not performed today   Modalities  Date:  Vaso: Knee, 34 degrees; low pressure, 15 mins, Pain                                     07/18/23 EXERCISE LOG  Exercise Repetitions and Resistance Comments  SLR 5 reps    Bridge 5 reps   LAQ    Standing HS curl     Mini squat      Blank cell = exercise not performed today   PATIENT EDUCATION:  Education details: HEP, POC, healing, arthritis, anatomy, prognosis, benefits of exercise, and goals for physical therapy Person educated: Patient Education method: Explanation Education comprehension: verbalized understanding  HOME EXERCISE PROGRAM: [id number]  ASSESSMENT:  CLINICAL IMPRESSION: Patient was progressed with mini squats and other familiar interventions for improved functional mobility. She required minimal cueing with mini squats for appropriate squat depth to avoid significantly  aggravating her familiar symptoms. She experienced a mild increase in her familiar knee pain with mini squats, but this did not persist into any of today's other interventions. She reported that her knee felt good upon the conclusion of treatment. She continues to require skilled physical therapy to address her remaining impairments to return to her prior level of function.    OBJECTIVE IMPAIRMENTS: Abnormal gait, decreased activity tolerance, decreased mobility, difficulty walking, decreased ROM, decreased strength, impaired tone, and pain.   ACTIVITY LIMITATIONS: stairs, transfers, and locomotion level  PARTICIPATION LIMITATIONS: cleaning, shopping, community activity, and occupation  PERSONAL FACTORS: Past/current experiences, Time since onset of injury/illness/exacerbation, and 3+ comorbidities: Hypertension, diabetes, diabetic neuropathy, chronic kidney disease, and obesity are also affecting patient's functional outcome.   REHAB POTENTIAL: Good  CLINICAL DECISION MAKING: Evolving/moderate complexity  EVALUATION COMPLEXITY: Moderate   GOALS: Goals reviewed with patient? Yes  SHORT TERM GOALS: Target date: 08/08/23 Patient will be independent with her initial HEP.  Baseline: Goal status: INITIAL  2.  Patient will improve her  bilateral quadriceps strength to at least 4+/5 for improved function with transfers.  Baseline:  Goal status: INITIAL  LONG TERM GOALS: Target date: 08/29/23  Patient will be independent with her advanced HEP.  Baseline:  Goal status: INITIAL  2.  Patient will be able to complete her daily activities without her familiar pain exceeding 2/10. Baseline:  Goal status: INITIAL  3.  Patient will be able to independently transfer from sitting to standing without UE support for improved independence.  Baseline:  Goal status: INITIAL  4.  Patient will be able to return to her exercise classes without being limited by her familiar knee symptoms.  Baseline:   Goal status: INITIAL  PLAN:  PT FREQUENCY: 1-2x/week  PT DURATION: 6 weeks  PLANNED INTERVENTIONS: 81191- PT Re-evaluation, 97750- Physical Performance Testing, 97110-Therapeutic exercises, 97530- Therapeutic activity, [id number]- Neuromuscular re-education, 97535- Self Care, 47829- Manual therapy, [phone]- Gait training, [phone]- Electrical stimulation (unattended), 97016- Vasopneumatic device, Patient/Family education, Balance training, Stair training, Joint mobilization, Cryotherapy, and Moist heat  PLAN FOR NEXT SESSION: Nustep, lower extremity strengthening, balance interventions, and modalities as needed   Lane Pinon, PT 08/05/2023, 4:42 PM

## 2023-08-07 ENCOUNTER — Ambulatory Visit

## 2023-08-07 DIAGNOSIS — M25561 Pain in right knee: Secondary | ICD-10-CM | POA: Diagnosis not present

## 2023-08-07 DIAGNOSIS — G8929 Other chronic pain: Secondary | ICD-10-CM

## 2023-08-07 NOTE — Therapy (Signed)
 OUTPATIENT PHYSICAL THERAPY LOWER EXTREMITY TREATMENT   Patient Name: EDGAR CORRIGAN MRN: [id number] DOB:[birth year], 68 y.o., female Today's Date: 08/07/2023  END OF SESSION:  PT End of Session - 08/07/23 0934     Visit Number 6    Number of Visits 12    Date for PT Re-Evaluation 09/13/23    PT Start Time 0930    PT Stop Time 1033    PT Time Calculation (min) 63 min    Activity Tolerance Patient tolerated treatment well    Behavior During Therapy [facility] for tasks assessed/performed           Past Medical History:  Diagnosis Date   Anemia of chronic disease 11/17/2012   not on meds at this time   At risk for heart disease - +stres test 2012 followed by [facility], [place] with neg cardiac cath 03/2010 (non-obstructive CAD) per review of records, echo 02/2010 normal LVF 01/15/2012   Chronic kidney disease (CKD), stage IV (severe) (HCC) 01/09/2012   Diabetes mellitus with renal manifestations, uncontrolled    on meds   Diabetic neuropathy (HCC) 01/09/2012   Diastolic heart failure - stage 1 per review of echo results from 02/2010 01/15/2012   GERD (gastroesophageal reflux disease)    OTC meds for tx   Hyperlipidemia    on meds   Hypertension    on meds   Obesity    Peripheral arterial disease? Eval by vascular 2013 with no LE PAD and advised to stop pletal    Severe obesity (BMI >= 40) (HCC) 11/13/2012   Toe ulcer, right (HCC)    Ulcer of toe of left foot (HCC)    treated by [facility] per patient   Venous stasis of lower extremity 03/10/2012   Past Surgical History:  Procedure Laterality Date   COLONOSCOPY  2013/2014   [place]- records not available    REFRACTIVE SURGERY Bilateral    TOE AMPUTATION Right 2008   RIGHT    Patient Active Problem List   Diagnosis Date Noted   Hyperlipidemia associated with type 2 diabetes mellitus (HCC) 11/18/2017   Type 2 diabetes mellitus with stage 3 chronic kidney disease, with long-term current use of insulin  (HCC)  03/15/2015   Anemia of chronic disease 11/17/2012   Severe obesity (BMI >= 40) (HCC) 11/13/2012   Venous stasis of lower extremity 03/10/2012   At risk for heart disease - +stres test 2012 followed by [employer], [place] with neg cardiac cath 03/2010 (non-obstructive CAD) per review of records, echo 02/2010 normal LVF 01/15/2012   Diastolic heart failure - stage 1 per review of echo results from 02/2010 01/15/2012   Hyperlipidemia 01/09/2012   Diabetic neuropathy (HCC) 01/09/2012   Hypertension associated with diabetes (HCC) 01/09/2012   Chronic kidney disease (CKD), stage IV (severe) (HCC) 01/09/2012   REFERRING PROVIDER: Zilphia Hilt, Charyl Coppersmith, MD   REFERRING DIAG: Chronic pain of left knee   THERAPY DIAG:  Chronic pain of both knees  Rationale for Evaluation and Treatment: Rehabilitation  ONSET DATE: 6 months  SUBJECTIVE:   SUBJECTIVE STATEMENT: Patient reports 2/10 bil knee pain today.   PERTINENT HISTORY: Hypertension, diabetes, diabetic neuropathy, chronic kidney disease, and obesity PAIN:  Are you having pain? Yes: NPRS scale: 2/10 Pain location: bil knees  PRECAUTIONS: None  RED FLAGS: None   WEIGHT BEARING RESTRICTIONS: No  FALLS:  Has patient fallen in last 6 months? No, but my balance is off  LIVING ENVIRONMENT: Lives with:  lives with their spouse Lives in: House/apartment Stairs: Yes: External: 6 steps; can reach both; varies between step to and reciprocal pattern depending on how her knees feel  Has following equipment at home: None  OCCUPATION: [employer]; primarily standing, 9-10 hour shift   PLOF: Independent  PATIENT GOALS: be able to do aerobics and return to her dance classes  NEXT MD VISIT: none scheduled  OBJECTIVE:  Note: Objective measures were completed at Evaluation unless otherwise noted.  PATIENT SURVEYS:  LEFS  Extreme difficulty/unable (0), Quite a bit of difficulty (1), Moderate difficulty (2), Little difficulty (3),  No difficulty (4) Survey date:  07/18/23  Any of your usual work, housework or school activities 4  2. Usual hobbies, recreational or sporting activities 3  3. Getting into/out of the bath 4  4. Walking between rooms 4  5. Putting on socks/shoes 4  6. Squatting  3  7. Lifting an object, like a bag of groceries from the floor 4  8. Performing light activities around your home 4  9. Performing heavy activities around your home 4  10. Getting into/out of a car 3  11. Walking 2 blocks 3  12. Walking 1 mile 3  13. Going up/down 10 stairs (1 flight) 3  14. Standing for 1 hour 3  15.  sitting for 1 hour 3  16. Running on even ground 3  17. Running on uneven ground 3  18. Making sharp turns while running fast 3  19. Hopping  3  20. Rolling over in bed 4  Score total:  68/80     COGNITION: Overall cognitive status: Within functional limits for tasks assessed     SENSATION: Patient reports intermittent tingling in both feet secondary to diabetic neuropathy.   PALPATION: TTP: bilateral medial and lateral tibiofemoral joint lines, quadriceps, hip adductors, hamstrings, and right IT band  LOWER EXTREMITY ROM:  Active ROM Right eval Left eval  Hip flexion    Hip extension    Hip abduction    Hip adduction    Hip internal rotation    Hip external rotation    Knee flexion 117 123  Knee extension 6 1  Ankle dorsiflexion    Ankle plantarflexion    Ankle inversion    Ankle eversion     (Blank rows = not tested)  LOWER EXTREMITY MMT:  MMT Right eval Left eval  Hip flexion 4/5; pressure 4/5; pressure  Hip extension    Hip abduction    Hip adduction    Hip internal rotation    Hip external rotation    Knee flexion 4/5; familiar pain 4/5  Knee extension 4/5; slight pain 4/5; familiar pain  Ankle dorsiflexion 4+/5 4+/5  Ankle plantarflexion    Ankle inversion    Ankle eversion     (Blank rows = not tested)  GAIT: Assistive device utilized: None Level of assistance:  Complete Independence Comments: decreased gait speed and stride length  TREATMENT DATE:                                     08/07/23 EXERCISE LOG  Exercise Repetitions and Resistance Comments  Nustep  L4 x 16 minutes   Mini squat  25 reps   Standing HS curl  2.5 minutes  Alternating LE  LAQ 3# x 25 reps each    Seated marching  3# x 25 reps each    Rocker board  5 minutes   Lunges onto step  6 step x 3 minutes    Blank cell = exercise not performed today  Modalities: no adverse reaction to today's modalities  Date:  Vaso: Knee, 34 degrees; low pressure, 15 mins, Pain  08/01/23                                  EXERCISE LOG  Exercise Repetitions and Resistance Comments  Nustep Lvl 3 x 15 mins   Rockerboard 5 mins   Lunges    Forward Step Ups 6 step x 3 mins bil   LAQs 2# x 30 reps bil   Seated Marches 2# x 30 reps bil   Seated Hip Abduction Red x 3 mins   Seated Ham Curls Green x 30 reps bil    Blank cell = exercise not performed today   Modalities  Date:  Vaso: Knee, 34 degrees; low pressure, 15 mins, Pain                                     07/18/23 EXERCISE LOG  Exercise Repetitions and Resistance Comments  SLR 5 reps    Bridge 5 reps   LAQ    Standing HS curl     Mini squat      Blank cell = exercise not performed today   PATIENT EDUCATION:  Education details: HEP, POC, healing, arthritis, anatomy, prognosis, benefits of exercise, and goals for physical therapy Person educated: Patient Education method: Explanation Education comprehension: verbalized understanding  HOME EXERCISE PROGRAM: [id number]  ASSESSMENT:  CLINICAL IMPRESSION: Pt arrives for today's treatment session reporting 2/10 bil knee pain.  Pt able to demonstrate 4+/5 bil quad strength meeting all of her STGs.  Pt able to perform STS tranfers without use of UE  support.  Pt requiring momentum and several attempts.  Pt able to tolerate increased weight with seated exercises today with good posture demonstrated.  Normal responses to vaso noted upon removal.  Pt reported minimal decrease in pain at completion of today's treatment session.  OBJECTIVE IMPAIRMENTS: Abnormal gait, decreased activity tolerance, decreased mobility, difficulty walking, decreased ROM, decreased strength, impaired tone, and pain.   ACTIVITY LIMITATIONS: stairs, transfers, and locomotion level  PARTICIPATION LIMITATIONS: cleaning, shopping, community activity, and occupation  PERSONAL FACTORS: Past/current experiences, Time since onset of injury/illness/exacerbation, and 3+ comorbidities: Hypertension, diabetes, diabetic neuropathy, chronic kidney disease, and obesity are also affecting patient's functional outcome.   REHAB POTENTIAL: Good  CLINICAL DECISION MAKING: Evolving/moderate complexity  EVALUATION COMPLEXITY: Moderate   GOALS: Goals reviewed with patient? Yes  SHORT TERM GOALS: Target date: 08/08/23 Patient will be independent with her initial HEP.  Baseline: Goal status: MET  2.  Patient will improve her bilateral quadriceps strength to at least 4+/5 for improved function  with transfers.  Baseline:  Goal status: MET  LONG TERM GOALS: Target date: 08/29/23  Patient will be independent with her advanced HEP.  Baseline:  Goal status: IN PROGRESS  2.  Patient will be able to complete her daily activities without her familiar pain exceeding 2/10. Baseline:  Goal status: IN PROGRESS  3.  Patient will be able to independently transfer from sitting to standing without UE support for improved independence.  Baseline:  Goal status: MET  4.  Patient will be able to return to her exercise classes without being limited by her familiar knee symptoms.  Baseline: 6/18: Line dancing Goal status: IN PROGRESS  PLAN:  PT FREQUENCY: 1-2x/week  PT DURATION: 6  weeks  PLANNED INTERVENTIONS: 97164- PT Re-evaluation, 97750- Physical Performance Testing, 97110-Therapeutic exercises, 97530- Therapeutic activity, 97112- Neuromuscular re-education, [phone]- Self Care, 60454- Manual therapy, [phone]- Gait training, [phone]- Electrical stimulation (unattended), 97016- Vasopneumatic device, Patient/Family education, Balance training, Stair training, Joint mobilization, Cryotherapy, and Moist heat  PLAN FOR NEXT SESSION: Nustep, lower extremity strengthening, balance interventions, and modalities as needed   Deryl Flora, PTA 08/07/2023, 10:38 AM

## 2023-08-10 ENCOUNTER — Other Ambulatory Visit: Payer: Self-pay | Admitting: Internal Medicine

## 2023-08-10 DIAGNOSIS — E1169 Type 2 diabetes mellitus with other specified complication: Secondary | ICD-10-CM

## 2023-08-12 ENCOUNTER — Encounter

## 2023-08-15 ENCOUNTER — Encounter

## 2023-09-23 ENCOUNTER — Ambulatory Visit: Admitting: Internal Medicine

## 2023-10-22 ENCOUNTER — Ambulatory Visit: Admitting: Internal Medicine

## 2023-11-07 ENCOUNTER — Ambulatory Visit: Admitting: Internal Medicine

## 2023-11-07 ENCOUNTER — Encounter: Payer: Self-pay | Admitting: Internal Medicine

## 2023-11-07 VITALS — BP 130/70 | HR 82 | Ht 70.0 in | Wt 302.2 lb

## 2023-11-07 DIAGNOSIS — E049 Nontoxic goiter, unspecified: Secondary | ICD-10-CM | POA: Diagnosis not present

## 2023-11-07 DIAGNOSIS — E1165 Type 2 diabetes mellitus with hyperglycemia: Secondary | ICD-10-CM

## 2023-11-07 DIAGNOSIS — E1169 Type 2 diabetes mellitus with other specified complication: Secondary | ICD-10-CM | POA: Diagnosis not present

## 2023-11-07 DIAGNOSIS — E1159 Type 2 diabetes mellitus with other circulatory complications: Secondary | ICD-10-CM

## 2023-11-07 DIAGNOSIS — E785 Hyperlipidemia, unspecified: Secondary | ICD-10-CM

## 2023-11-07 LAB — POCT GLYCOSYLATED HEMOGLOBIN (HGB A1C): Hemoglobin A1C: 8.1 % — AB (ref 4.0–5.6)

## 2023-11-07 MED ORDER — OZEMPIC (2 MG/DOSE) 8 MG/3ML ~~LOC~~ SOPN: 2.0000 mg | PEN_INJECTOR | SUBCUTANEOUS | Status: DC

## 2023-11-07 NOTE — Patient Instructions (Addendum)
 Please use the following regimen: - Basaglar  20 units in a.m. and 20 units at night - Novolog  dose: 20-25 units 15 min before every meal and 5-10 units before a snack  Increase: - Ozempic  1 mg weekly +  On the Ozempic  1 mg pen: - 18 clicks  - 0.25 mg - 36 clicks - 0.5 mg - 54 clicks - 0.75 mg - 72 clicks - 1 mg  Please return in 3-4 months.

## 2023-11-07 NOTE — Progress Notes (Signed)
 Subjective:     Patient ID: Savannah Crawford, female   DOB: [birth year], 68 y.o.   MRN: [id number]  HPI [name] is a 68 y.o. woman returning for f/u for DM2, insulin -dependent, uncontrolled, with complications (CAD, CKD, diabetes neuropathy, diabetic retinopathy, PVD, amputated toe-R), multinodular goiter. Last visit 3 months ago.  Interim history: No increased urination, blurry vision, nausea, chest pain.    DM2: Reviewed HbA1c levels: Lab Results  Component Value Date   HGBA1C 7.9 (A) 05/23/2023   HGBA1C 8.0 (A) 01/31/2023   HGBA1C 7.5 (A) 04/26/2022  09/14/2021: HbA1c 7.9%  She is on: - Basaglar  40 units in a.m. and 30-40 units at night  >> 25 >> Lantus  20 units 2x a day - Novolog  dose: (15-25) - (15-20) - (15-20) units before meals (but forgets to take it 15 minutes before meals)  >> 20-25 units before meals - Novolog  Sliding scale: - 150- 165: + 1 unit  - 166- 180: + 2 units  - 181- 195: + 3 units  - 196- 210: + 4 units  - >210: + 5 units  Take NovoLog  15 minutes before every meal. - Ozempic  0.5 mg weekly- started 03/2020 >> 1 mg weekly  I again suggested Ozempic  0.5 mg weekly in the past, also: 11/2018 >> did not start as she did not want to add another med... In 03/2019, she did try to start it but this was expensive. She was on metformin in the past, but stopped 2/2 CKD.  Tried Victoza before >> cannot remember SE.  Checks sugars >4x a day with the CGM (with Receiver):  Prev.:  Prev.:  Lowest: 68 >> .[name].  100 >> 78 >> 64 >> 60s. Highest sugar 290 (missed ins.) >> 284 >> upper 200s >> 300s.  Meter: Freestyle Lite  + CKD-sees nephrology: Lab Results  Component Value Date   BUN 21 05/06/2023   CREATININE 1.4 (A) 05/06/2023   Lab Results  Component Value Date   MICRALBCREAT 9 05/07/2023   MICRALBCREAT 4 10/30/2022  On lisinopril  10.  + HL; Latest lipids: Lab Results  Component Value Date   CHOL 124 05/23/2023   HDL 51 05/23/2023   LDLCALC 56  05/23/2023   TRIG 83 05/23/2023   CHOLHDL 2.4 05/23/2023  On Lipitor 20.  Last eye exam 03/21/2023: + DR, + cataract reportedly.   + Stable numbness and tingling in her feet.  She sees podiatry.  She has a history of left third toe ulcer.  She previously saw Dr. Joaquim 12/24/2022.  She has a history of skin graft fourth toe ulcer.  She now sees Dr. Fulton.  Records are not available. She had normal ABIs 03/28/2023.  MNG:  Thyroid  ultrasound (05/24/2022): Parenchymal Echotexture: Moderately heterogeneous  Isthmus: 1.9 cm  Right lobe: 5.0 x 2.9 x 2.7 cm  Left lobe: 4.1 x 1.5 x 1.8 cm  _________________________________________________________   Estimated total number of nodules >/= 1 cm: 2 _________________________________________________________   Nodule # 1:  Location: Isthmus; superior  Maximum size: 1.6 cm; Other 2 dimensions: 1.5 x 0.9 cm  Composition: solid/almost completely solid (2)  Echogenicity: isoechoic (1) *Given size (>/= 1.5 - 2.4 cm) and appearance, a follow-up ultrasound in 1 year should be considered based on TI-RADS criteria.  _________________________________________________________   Nodule # 2:  Location: Right; inferior  Maximum size: 4.8 cm; Other 2 dimensions: 2.6 x 4.6 cm  Composition: mixed cystic and solid (1)  Echogenicity: isoechoic (1) This nodule does NOT meet TI-RADS  criteria for biopsy or dedicated follow-up.  ____________________________________________________   Nodule 3: 0.6 cm spongiform nodule in the inferior left thyroid  lobe does not meet criteria for imaging surveillance or FNA.   IMPRESSION: Nodule 1 (TI-RADS 3), measuring 1.6 cm, located in the isthmus meets criteria for imaging follow-up. Annual ultrasound surveillance is recommended until 5 years of stability is documented.  Thyroid  U/S (05/27/2023): Parenchymal Echotexture: Moderately heterogeneous  Isthmus: 1.0 cm  Right lobe: 5.4 x 3.2 x 3.0 cm  Left lobe: 3.6 x 1.3 x 1.3 cm   _________________________________________________________   Estimated total number of nodules >/= 1 cm: 2 _________________________________________________________   Nodule # 1:  Prior biopsy: No  Location: Isthmus; Superior  Maximum size: 1.5 cm; Other 2 dimensions: 1.0 x 1.4 cm, previously, 1.6 x 0.9 x 1.5 cm Composition: solid/almost completely solid (2)  Echogenicity: isoechoic (1) *Given size (>/= 1.5 - 2.4 cm) and appearance, a follow-up ultrasound in 1 year should be considered based on TI-RADS criteria.  _________________________________________________________   Nodule # 2:  Prior biopsy: No  Location: Right; inferior  Maximum size: 4.1 cm; Other 2 dimensions: 4.0 x 3.0 cm, previously, 4.8 x 4.6 x 2.6 cm  Composition: mixed cystic and solid (1)  Echogenicity: isoechoic (1) This nodule does NOT meet TI-RADS criteria for biopsy or dedicated follow-up. _________________________________________________________   Nodule 3: 0.6 cm spongiform nodule in the inferior left thyroid  lobe is unchanged from prior exam and does not meet criteria for FNA or imaging follow-up.   IMPRESSION: 1. Unchanged multinodular goiter. 2. Nodule 1 (TI-RADS 3), measuring 1.5 cm, located in the isthmus is unchanged from the prior examination and still meets criteria for imaging follow-up. Annual ultrasound surveillance is recommended until 5 years of stability is documented.  She denies: - feeling nodules in neck - hoarseness - dysphagia - choking -only occasionally, with drier foods  She has a history of nonobstructive CAD, with positive stress test but a negative cardiac cath 2012, and also has diastolic heart failure. She has HTN, venous stasis, vitamin D deficiency.  She denies family history of medullary thyroid  cancer and no personal history of pancreatitis.  Review of Systems + see HPI  I reviewed pt's medications, allergies, PMH, social hx, family hx, and changes were documented  in the history of present illness. Otherwise, unchanged from my initial visit note.  Past Medical History:  Diagnosis Date   Anemia of chronic disease 11/17/2012   not on meds at this time   At risk for heart disease - +stres test 2012 followed by [facility], [place] with neg cardiac cath 03/2010 (non-obstructive CAD) per review of records, echo 02/2010 normal LVF 01/15/2012   Chronic kidney disease (CKD), stage IV (severe) (HCC) 01/09/2012   Diabetes mellitus with renal manifestations, uncontrolled    on meds   Diabetic neuropathy (HCC) 01/09/2012   Diastolic heart failure - stage 1 per review of echo results from 02/2010 01/15/2012   GERD (gastroesophageal reflux disease)    OTC meds for tx   Hyperlipidemia    on meds   Hypertension    on meds   Obesity    Peripheral arterial disease? Eval by vascular 2013 with no LE PAD and advised to stop pletal    Severe obesity (BMI >= 40) (HCC) 11/13/2012   Toe ulcer, right (HCC)    Ulcer of toe of left foot (HCC)    treated by [facility] per patient   Venous stasis of lower extremity 03/10/2012  Past Surgical History:  Procedure Laterality Date   COLONOSCOPY  2013/2014   [place]- records not available    REFRACTIVE SURGERY Bilateral    TOE AMPUTATION Right 2008   RIGHT    Social History   Socioeconomic History   Marital status: Married    Spouse name: Not on file   Number of children: 2   Years of education: Not on file   Highest education level: GED or equivalent  Occupational History   Not on file  Tobacco Use   Smoking status: Former    Current packs/day: 0.00    Types: Cigarettes    Quit date: 02/20/1999    Years since quitting: 24.7   Smokeless tobacco: Never  Vaping Use   Vaping status: Never Used  Substance and Sexual Activity   Alcohol use: Yes    Alcohol/week: 4.0 standard drinks of alcohol    Types: 4 Standard drinks or equivalent per week    Comment: Occasional Drinks   Drug use: Not Currently   Sexual  activity: Not on file  Other Topics Concern   Not on file  Social History Narrative   Are you right handed or left handed? Right Handed    Are you currently employed ? Yes. Work 2-3 days at [employer] is your current occupation?   Do you live at home alone? No   Who lives with you? Husband, daughter, and granddaughter    What type of home do you live in: 1 story or 2 story? One story home        Social Drivers of Health   Financial Resource Strain: Low Risk  (02/28/2023)   Overall Financial Resource Strain (CARDIA)    Difficulty of Paying Living Expenses: Not hard at all  Food Insecurity: No Food Insecurity (02/28/2023)   Hunger Vital Sign    Worried About Running Out of Food in the Last Year: Never true    Ran Out of Food in the Last Year: Never true  Transportation Needs: No Transportation Needs (02/28/2023)   PRAPARE - Administrator, Civil Service (Medical): No    Lack of Transportation (Non-Medical): No  Physical Activity: Sufficiently Active (02/28/2023)   Exercise Vital Sign    Days of Exercise per Week: 3 days    Minutes of Exercise per Session: 60 min  Stress: No Stress Concern Present (02/28/2023)   [employer] of Occupational Health - Occupational Stress Questionnaire    Feeling of Stress : Not at all  Social Connections: Moderately Integrated (02/28/2023)   Social Connection and Isolation Panel    Frequency of Communication with Friends and Family: More than three times a week    Frequency of Social Gatherings with Friends and Family: Three times a week    Attends Religious Services: More than 4 times per year    Active Member of Clubs or Organizations: No    Attends Banker Meetings: Never    Marital Status: Married  Catering manager Violence: Not At Risk (02/28/2023)   Humiliation, Afraid, Rape, and Kick questionnaire    Fear of Current or Ex-Partner: No    Emotionally Abused: No    Physically Abused: No    Sexually Abused: No    Current Outpatient Medications on File Prior to Visit  Medication Sig Dispense Refill   atorvastatin  (LIPITOR) 20 MG tablet TAKE 1 TABLET BY MOUTH EVERY DAY 90 tablet 1   Blood Glucose Monitoring Suppl (ACCU-CHEK GUIDE ME) w/Device KIT  Use as instructed to check blood sugar 4 times daily 1 kit 0   Continuous Glucose Sensor (DEXCOM G7 SENSOR) MISC Use as instructed to check blood sugar. Change every 10 days 9 each 3   furosemide  (LASIX ) 40 MG tablet Take 1 tablet (40 mg total) by mouth 2 (two) times daily. 180 tablet 1   insulin  aspart (NOVOLOG  FLEXPEN) 100 UNIT/ML FlexPen INJECT UP TO 90 UNITS DAILY AS INSTRUCTED 60 mL 3   insulin  glargine (LANTUS  SOLOSTAR) 100 UNIT/ML Solostar Pen Inject 25 Units into the skin 2 (two) times daily. 45 mL 3   Insulin  Pen Needle (B-D UF III MINI PEN NEEDLES) 31G X 5 MM MISC Use to inject medication 5 times a day 1000 each 3   lisinopril  (ZESTRIL ) 10 MG tablet Take 1 tablet (10 mg total) by mouth daily. 90 tablet 1   metoprolol  succinate (TOPROL -XL) 25 MG 24 hr tablet TAKE 1 TABLET BY MOUTH EVERY DAY 90 tablet 1   mupirocin ointment (BACTROBAN) 2 % SMARTSIG:1 Application Topical 2-3 Times Daily     Semaglutide , 1 MG/DOSE, (OZEMPIC , 1 MG/DOSE,) 4 MG/3ML SOPN Inject 1 mg under skin weekly 9 mL 3   Vitamin D, Ergocalciferol, (DRISDOL) 1.25 MG (50000 UNIT) CAPS capsule Take 1 capsule by mouth once a week.     No current facility-administered medications on file prior to visit.   No Known Allergies Family History  Problem Relation Age of Onset   Diabetes Mother    Hyperlipidemia Mother    Hypertension Mother    Heart attack Mother    Stroke Mother    Diabetes Sister    Breast cancer Sister 65   Hypertension Brother    Diabetes Sister    Diabetes Sister    Obesity Other    Hypertension Other    Hyperlipidemia Other    Stroke Other    Heart attack Other    Colon polyps Neg Hx    Colon cancer Neg Hx    Esophageal cancer Neg Hx    Rectal cancer Neg Hx     Stomach cancer Neg Hx    Objective:   Physical Exam BP 130/70   Pulse 82   Ht 5' 10 (1.778 m)   Wt (!) 302 lb 3.2 oz (137.1 kg)   SpO2 96%   BMI 43.36 kg/m   Wt Readings from Last 10 Encounters:  11/07/23 (!) 302 lb 3.2 oz (137.1 kg)  07/08/23 279 lb 6.4 oz (126.7 kg)  05/23/23 297 lb 12.8 oz (135.1 kg)  05/16/23 300 lb (136.1 kg)  04/04/23 295 lb 12.8 oz (134.2 kg)  01/31/23 296 lb 3.2 oz (134.4 kg)  11/29/22 297 lb 8 oz (134.9 kg)  05/10/22 299 lb (135.6 kg)  04/26/22 297 lb 3.2 oz (134.8 kg)  03/14/22 295 lb (133.8 kg)   Constitutional: overweight, in NAD Eyes:  no exophthalmos ENT:  + thyromegaly, no cervical lymphadenopathy Cardiovascular: RRR, No MRG, + B LE edema Respiratory: CTA B Musculoskeletal: no deformities Skin: no rashes Neurological: no tremor with outstretched hands Diabetic Foot Exam - Simple   Simple Foot Form Diabetic Foot exam was performed with the following findings: Yes 11/07/2023  4:30 PM  Visual Inspection No deformities, no ulcerations, no other skin breakdown bilaterally: Yes Sensation Testing See comments: Yes Pulse Check Posterior Tibialis and Dorsalis pulse intact bilaterally: Yes Comments B pitting edema R 2nd toe amputation R hallux valgum Healed ulcer on dorsum 4th R toe L 3rd skin induration Nail discoloration  in all toe Thick toenails Boot on R foot No sensation to monofilament in B forefoot    Assessment:     1. DM2, insulin -dependent, uncontrolled, with complications - nonobstructive CAD - CKD - diabetes neuropathy - diabetic retinopathy - PVD - R amputated toe  2. HL  3. Obesity class 3  4.  Isthmic thyroid  nodule    Plan:     1. Pt with history of uncontrolled type 2 diabetes, on basal-bolus insulin  regimen and weekly GLP-1 receptor agonist, with worsening control.  At last visit, sugars were improving overnight but increasing after she was waking up.  She was missing the first dose of Basaglar  of the day  because the blood sugars in the morning were at goal.  We discussed that she absolutely needed to take it even if the sugars were at goal.  She was also missing NovoLog  injections before meals and we discussed about injecting these 15 minutes before every meal and also occasionally before snacks. -She is seeing Dr. Prescilla with nephrology - she suggested suggest an SGLT2 inhibitor if the patient developed proteinuria.  Latest ACR was normal, 9, GFR 42. CGM interpretation: -At today's visit, we reviewed her CGM downloads: It appears that 49% of values are in target range (goal >70%), while 51% are higher than 180 (goal <25%), and 0% are lower than 70 (goal <4%).  The calculated average blood sugar is 184.  The projected HbA1c for the next 3 months (GMI) is 7.7%. -Reviewing the CGM trends, sugars are improving overnight but they increased after every meal, particularly after breakfast.  Upon questioning, she is forgetting to take the NovoLog  dose 15 minutes before meals.  We again discussed that this is very important. I also recommended an increase in Ozempic  dose.  She tolerates the lower dose well.  We discussed about how to increase the dose slowly, using the 1 mg pens, which she has at home, as she just refilled the prescription.  I advised her to let me know when she is ready for me to call in the 2 mg Ozempic  dose., -  I advised her to: Patient Instructions  Please use the following regimen: - Basaglar  20 units in a.m. and 20 units at night - Novolog  dose: 20-25 units 15 min before every meal and 5-10 units before a snack  Increase: - Ozempic  1 mg weekly +  On the Ozempic  1 mg pen: - 18 clicks  - 0.25 mg - 36 clicks - 0.5 mg - 54 clicks - 0.75 mg - 72 clicks - 1 mg  Please return in 3-4 months.  - we checked her HbA1c: 8.1% (higher) - advised to check sugars at different times of the day - 4x a day, rotating check times - advised for yearly eye exams >> she is UTD - return to  clinic in 3-4 months  2. HL - Latest lipid panel showed fractions at goal in 05/2023: Lab Results  Component Value Date   CHOL 124 05/23/2023   HDL 51 05/23/2023   LDLCALC 56 05/23/2023   TRIG 83 05/23/2023   CHOLHDL 2.4 05/23/2023  -She continues on Lipitor 20 mg daily without side effects  3.  Obesity class III - She continues on Ozempic  which should also help with weight loss - She gained a net 5 pounds since last visit  4.  Thyroid  nodules -She had a mass palpated in the central lower neck previously -We checked a thyroid  ultrasound (05/24/2022) and this showed an isthmic  nodule, without worrisome features, but which required a 1 year follow-up. She also had a subcentimeter, spongiform, thyroid  nodule in the inferior left lobe, for which no intervention or follow-up was needed. -Since last visit, we checked another thyroid  ultrasound (05/27/2023).  The isthmic nodule still needs 1 year follow-up -No neck compression symptoms  Lela Fendt, MD PhD [facility] Endocrinology

## 2023-11-13 ENCOUNTER — Ambulatory Visit (INDEPENDENT_AMBULATORY_CARE_PROVIDER_SITE_OTHER): Admitting: Internal Medicine

## 2023-11-13 ENCOUNTER — Encounter: Payer: Self-pay | Admitting: Internal Medicine

## 2023-11-13 VITALS — BP 124/70 | HR 76 | Temp 98.6°F | Wt 297.4 lb

## 2023-11-13 DIAGNOSIS — R059 Cough, unspecified: Secondary | ICD-10-CM | POA: Diagnosis not present

## 2023-11-13 DIAGNOSIS — J069 Acute upper respiratory infection, unspecified: Secondary | ICD-10-CM | POA: Diagnosis not present

## 2023-11-13 DIAGNOSIS — Z23 Encounter for immunization: Secondary | ICD-10-CM | POA: Diagnosis not present

## 2023-11-13 DIAGNOSIS — J029 Acute pharyngitis, unspecified: Secondary | ICD-10-CM

## 2023-11-13 LAB — POC COVID19 BINAXNOW: SARS Coronavirus 2 Ag: NEGATIVE

## 2023-11-13 LAB — POCT INFLUENZA A/B
Influenza A, POC: NEGATIVE
Influenza B, POC: NEGATIVE

## 2023-11-13 MED ORDER — BENZONATATE 100 MG PO CAPS: 100.0000 mg | ORAL_CAPSULE | Freq: Two times a day (BID) | ORAL | 0 refills | Status: DC | PRN

## 2023-11-13 NOTE — Addendum Note (Signed)
 Addended by: KATHRYNE MILLMAN B on: 11/13/2023 04:20 PM   Modules accepted: Orders

## 2023-11-13 NOTE — Progress Notes (Signed)
 Established Patient Office Visit     CC/Reason for Visit: URI symptoms  HPI: Savannah Crawford is a 68 y.o. female who is coming in today for the above mentioned reasons.  Symptoms started 4 days ago.  Coughing, mainly nonproductive, sore throat, hoarse voice.  She has been taking over-the-counter medication with some relief.  Cough is still significant.   Past Medical/Surgical History: Past Medical History:  Diagnosis Date   Anemia of chronic disease 11/17/2012   not on meds at this time   At risk for heart disease - +stres test 2012 followed by [facility], [place] with neg cardiac cath 03/2010 (non-obstructive CAD) per review of records, echo 02/2010 normal LVF 01/15/2012   Chronic kidney disease (CKD), stage IV (severe) (HCC) 01/09/2012   Diabetes mellitus with renal manifestations, uncontrolled    on meds   Diabetic neuropathy (HCC) 01/09/2012   Diastolic heart failure - stage 1 per review of echo results from 02/2010 01/15/2012   GERD (gastroesophageal reflux disease)    OTC meds for tx   Hyperlipidemia    on meds   Hypertension    on meds   Obesity    Peripheral arterial disease? Eval by vascular 2013 with no LE PAD and advised to stop pletal    Severe obesity (BMI >= 40) (HCC) 11/13/2012   Toe ulcer, right (HCC)    Ulcer of toe of left foot (HCC)    treated by [facility] per patient   Venous stasis of lower extremity 03/10/2012    Past Surgical History:  Procedure Laterality Date   COLONOSCOPY  2013/2014   [place]- records not available    REFRACTIVE SURGERY Bilateral    TOE AMPUTATION Right 2008   RIGHT     Social History:  reports that she quit smoking about 24 years ago. Her smoking use included cigarettes. She has never used smokeless tobacco. She reports current alcohol use of about 4.0 standard drinks of alcohol per week. She reports that she does not currently use drugs.  Allergies: No Known Allergies  Family History:  Family History   Problem Relation Age of Onset   Diabetes Mother    Hyperlipidemia Mother    Hypertension Mother    Heart attack Mother    Stroke Mother    Diabetes Sister    Breast cancer Sister 68   Hypertension Brother    Diabetes Sister    Diabetes Sister    Obesity Other    Hypertension Other    Hyperlipidemia Other    Stroke Other    Heart attack Other    Colon polyps Neg Hx    Colon cancer Neg Hx    Esophageal cancer Neg Hx    Rectal cancer Neg Hx    Stomach cancer Neg Hx      Current Outpatient Medications:    atorvastatin  (LIPITOR) 20 MG tablet, TAKE 1 TABLET BY MOUTH EVERY DAY, Disp: 90 tablet, Rfl: 1   benzonatate  (TESSALON ) 100 MG capsule, Take 1 capsule (100 mg total) by mouth 2 (two) times daily as needed for cough., Disp: 20 capsule, Rfl: 0   Blood Glucose Monitoring Suppl (ACCU-CHEK GUIDE ME) w/Device KIT, Use as instructed to check blood sugar 4 times daily, Disp: 1 kit, Rfl: 0   Continuous Glucose Sensor (DEXCOM G7 SENSOR) MISC, Use as instructed to check blood sugar. Change every 10 days, Disp: 9 each, Rfl: 3   furosemide  (LASIX ) 40 MG tablet, Take 1 tablet (40  mg total) by mouth 2 (two) times daily., Disp: 180 tablet, Rfl: 1   insulin  aspart (NOVOLOG  FLEXPEN) 100 UNIT/ML FlexPen, INJECT UP TO 90 UNITS DAILY AS INSTRUCTED, Disp: 60 mL, Rfl: 3   insulin  glargine (LANTUS  SOLOSTAR) 100 UNIT/ML Solostar Pen, Inject 25 Units into the skin 2 (two) times daily., Disp: 45 mL, Rfl: 3   Insulin  Pen Needle (B-D UF III MINI PEN NEEDLES) 31G X 5 MM MISC, Use to inject medication 5 times a day, Disp: 1000 each, Rfl: 3   lisinopril  (ZESTRIL ) 10 MG tablet, Take 1 tablet (10 mg total) by mouth daily., Disp: 90 tablet, Rfl: 1   metoprolol  succinate (TOPROL -XL) 25 MG 24 hr tablet, TAKE 1 TABLET BY MOUTH EVERY DAY, Disp: 90 tablet, Rfl: 1   mupirocin ointment (BACTROBAN) 2 %, SMARTSIG:1 Application Topical 2-3 Times Daily, Disp: , Rfl:    Semaglutide , 2 MG/DOSE, (OZEMPIC , 2 MG/DOSE,) 8 MG/3ML  SOPN, Inject 2 mg into the skin once a week., Disp: , Rfl:    Vitamin D, Ergocalciferol, (DRISDOL) 1.25 MG (50000 UNIT) CAPS capsule, Take 1 capsule by mouth once a week., Disp: , Rfl:   Review of Systems:  Negative unless indicated in HPI.   Physical Exam: Vitals:   11/13/23 1507  BP: 124/70  Pulse: 76  Temp: 98.6 F (37 C)  TempSrc: Oral  SpO2: 98%  Weight: 297 lb 6.4 oz (134.9 kg)    Body mass index is 42.67 kg/m.   Physical Exam Vitals reviewed.  Constitutional:      Appearance: Normal appearance.  HENT:     Right Ear: Tympanic membrane, ear canal and external ear normal.     Left Ear: Tympanic membrane, ear canal and external ear normal.     Mouth/Throat:     Mouth: Mucous membranes are moist.     Pharynx: Posterior oropharyngeal erythema present.  Eyes:     Conjunctiva/sclera: Conjunctivae normal.     Pupils: Pupils are equal, round, and reactive to light.  Cardiovascular:     Rate and Rhythm: Normal rate and regular rhythm.  Pulmonary:     Effort: Pulmonary effort is normal.     Breath sounds: Normal breath sounds.  Neurological:     Mental Status: She is alert.      Impression and Plan:  Viral URI with cough -     Benzonatate ; Take 1 capsule (100 mg total) by mouth 2 (two) times daily as needed for cough.  Dispense: 20 capsule; Refill: 0  Cough, unspecified type -     POC COVID-19 BinaxNow -     POCT Influenza A/B  Sore throat -     POC COVID-19 BinaxNow -     POCT Influenza A/B  -In office flu and COVID test are negative. - Sent Tessalon  Perles for cough. -Given exam findings, PNA, pharyngitis, ear infection are not likely, hence abx have not been prescribed. -Have advised rest, fluids, OTC antihistamines, cough suppressants and mucinex. -RTC if no improvement in 10-14 days.    Time spent:22 minutes reviewing chart, interviewing and examining patient and formulating plan of care.     Tully Theophilus Andrews, MD [facility] at  [facility]

## 2023-12-05 ENCOUNTER — Other Ambulatory Visit: Payer: Self-pay | Admitting: Internal Medicine

## 2023-12-05 DIAGNOSIS — E1122 Type 2 diabetes mellitus with diabetic chronic kidney disease: Secondary | ICD-10-CM

## 2023-12-10 ENCOUNTER — Telehealth: Payer: Self-pay

## 2023-12-10 NOTE — Telephone Encounter (Signed)
 Patient was identified as falling into the [place] Measure - Diabetes.   Patient was: Appointment already scheduled for:  03/12/2024 with Endocrinology Dr. Lela Fendt.

## 2023-12-29 ENCOUNTER — Other Ambulatory Visit: Payer: Self-pay | Admitting: Internal Medicine

## 2023-12-29 DIAGNOSIS — I5032 Chronic diastolic (congestive) heart failure: Secondary | ICD-10-CM

## 2023-12-30 IMAGING — MG MM DIGITAL DIAGNOSTIC UNILAT*L* W/ TOMO W/ CAD
4 series · 4 of 12 positions shown · non-contrast
Comparison: Previous exam(s).

CLINICAL DATA: Six-month follow-up of a left breast mass

EXAM:
DIGITAL DIAGNOSTIC UNILATERAL LEFT MAMMOGRAM WITH TOMOSYNTHESIS AND
CAD; ULTRASOUND LEFT BREAST LIMITED
TECHNIQUE: Left digital diagnostic mammography and breast tomosynthesis was
performed. The images were evaluated with computer-aided detection.;
Targeted ultrasound examination of the left breast was performed.

[L MLO synth-2D]
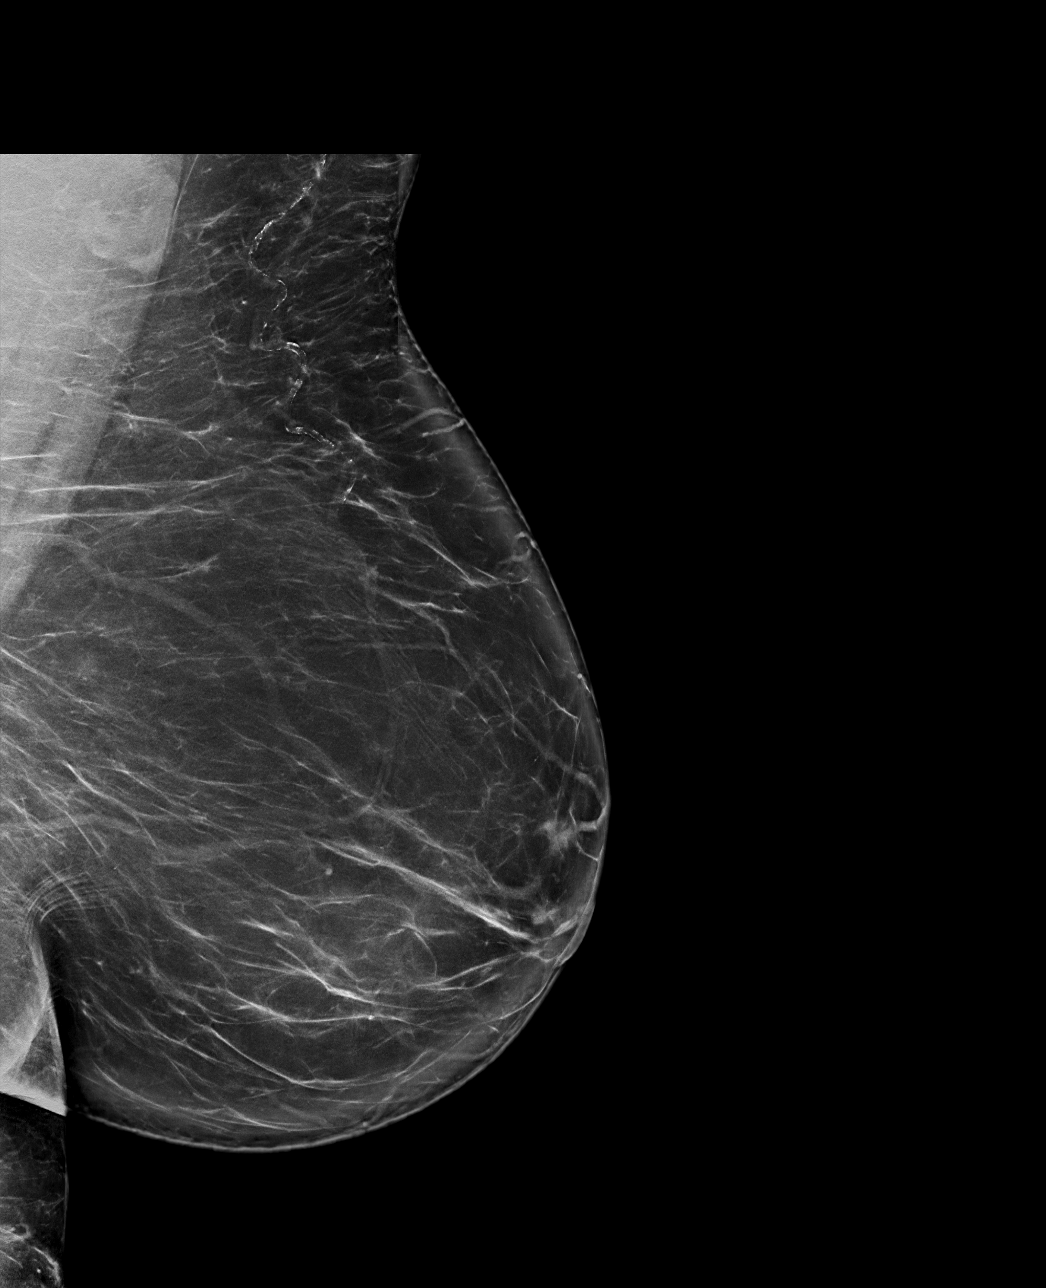

[L CC synth-2D]
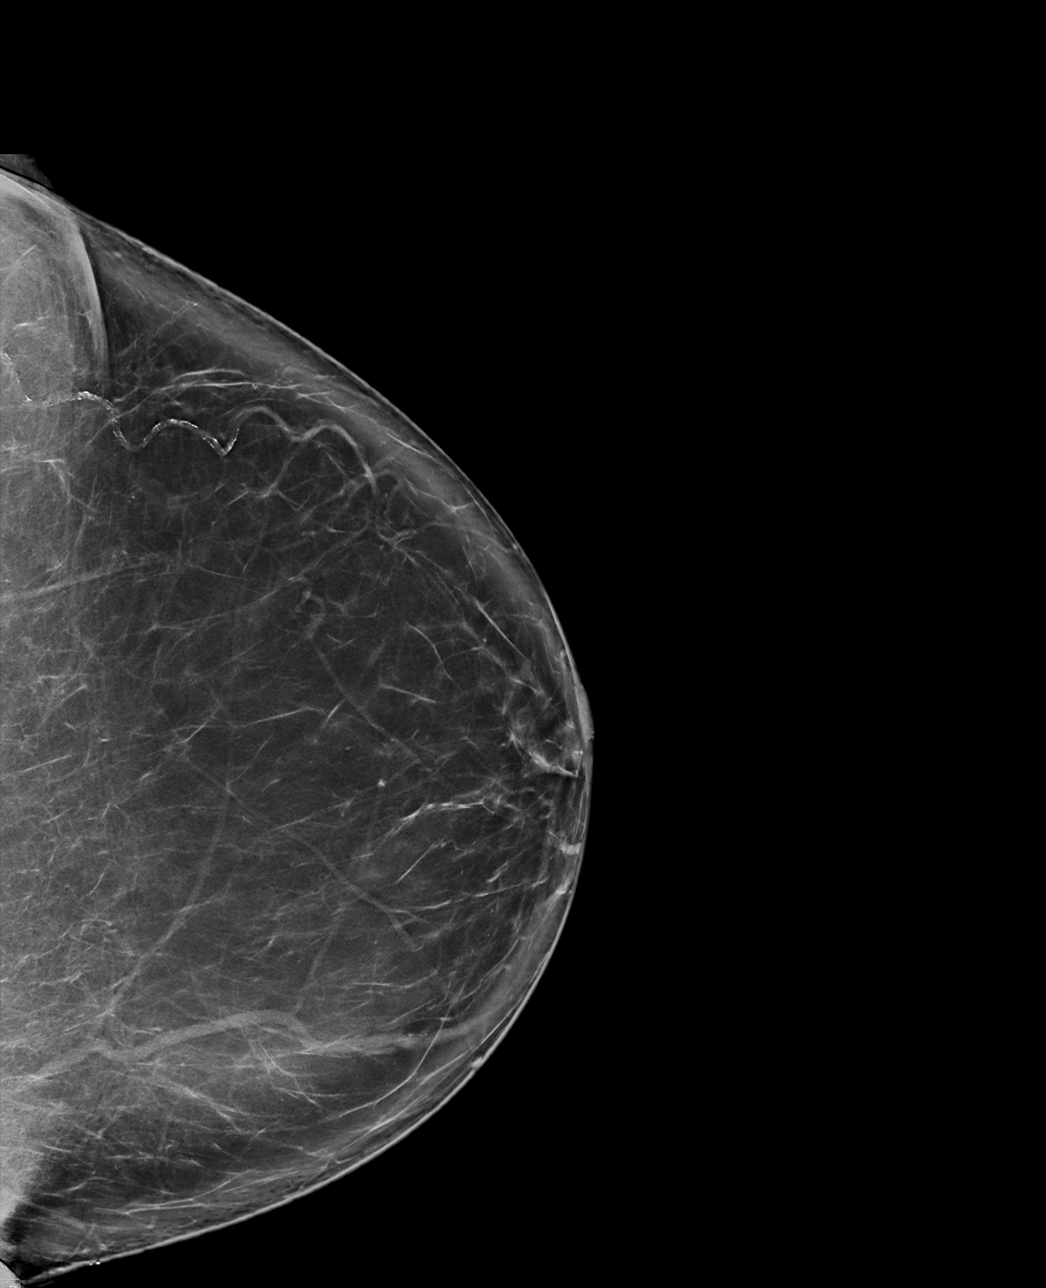

[L CC tomo · tomo slice 47/94.0]
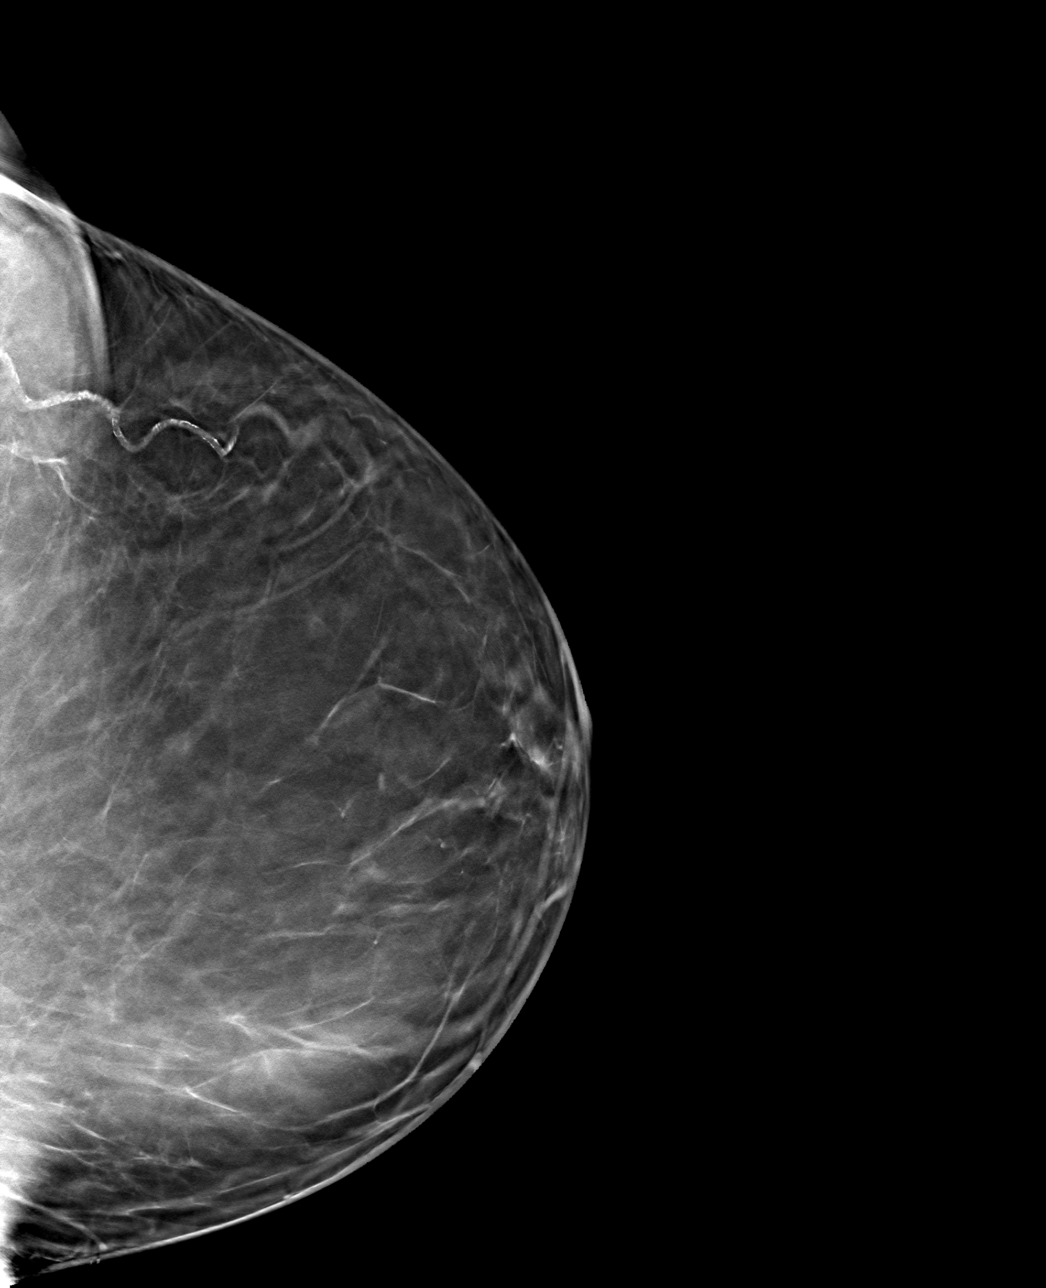

[L MLO tomo · tomo slice 51/101.0]
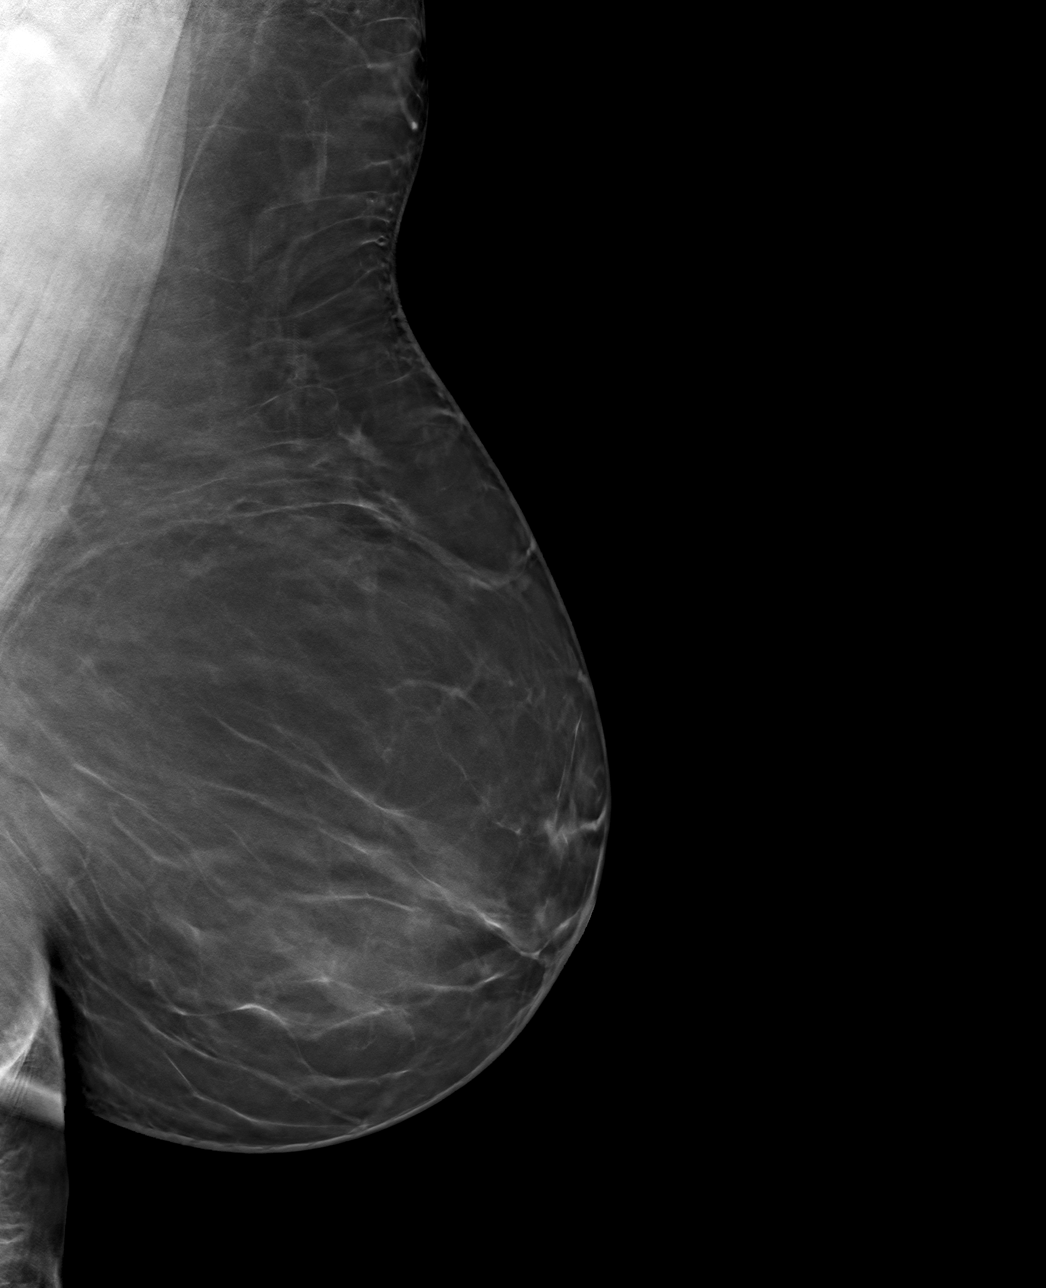

[4 of 12 positions shown; findings below may reference images not displayed]

ACR Breast Density Category b: There are scattered areas of
fibroglandular density.
FINDINGS: The medial left breast mass has resolved mammographically. No other
suspicious findings in the left breast.

On physical exam, no suspicious lumps are identified.

Targeted ultrasound is performed, showing interval resolution of the
previously identified left breast mass at 8 o'clock.
IMPRESSION: Interval resolution of the previously identified mass. No evidence
of malignancy in the left breast.

RECOMMENDATION:
Annual screening mammography.

I have discussed the findings and recommendations with the patient.
If applicable, a reminder letter will be sent to the patient
regarding the next appointment.

BI-RADS CATEGORY  1: Negative.

## 2024-01-03 ENCOUNTER — Other Ambulatory Visit: Payer: Self-pay | Admitting: Internal Medicine

## 2024-01-03 DIAGNOSIS — E1165 Type 2 diabetes mellitus with hyperglycemia: Secondary | ICD-10-CM

## 2024-01-06 LAB — LAB REPORT - SCANNED
Albumin, Urine POC: 17.6
Creatinine, POC: 93.1 mg/dL
EGFR: 41
Microalb Creat Ratio: 19

## 2024-01-06 LAB — CBC AND DIFFERENTIAL
HCT: 35 — AB (ref 36–46)
Hemoglobin: 11 — AB (ref 12.0–16.0)
Platelets: 289 K/uL (ref 150–400)
WBC: 7.5

## 2024-01-06 LAB — MICROALBUMIN / CREATININE URINE RATIO: Microalb Creat Ratio: 19

## 2024-01-06 LAB — BASIC METABOLIC PANEL WITH GFR
BUN: 16 (ref 4–21)
CO2: 25 — AB (ref 13–22)
Chloride: 105 (ref 99–108)
Creatinine: 1.4 — AB (ref 0.5–1.1)
Glucose: 113
Potassium: 4.3 meq/L (ref 3.5–5.1)
Sodium: 140 (ref 137–147)

## 2024-01-06 LAB — COMPREHENSIVE METABOLIC PANEL WITH GFR
Albumin: 3.9 (ref 3.5–5.0)
Calcium: 9 (ref 8.7–10.7)
eGFR: 41

## 2024-01-06 LAB — PROTEIN / CREATININE RATIO, URINE: Albumin, U: 17.6

## 2024-01-06 LAB — VITAMIN D 25 HYDROXY (VIT D DEFICIENCY, FRACTURES): Vit D, 25-Hydroxy: 29.7

## 2024-01-06 LAB — CBC: RBC: 4.16 (ref 3.87–5.11)

## 2024-02-01 ENCOUNTER — Other Ambulatory Visit: Payer: Self-pay | Admitting: Internal Medicine

## 2024-02-01 DIAGNOSIS — E1159 Type 2 diabetes mellitus with other circulatory complications: Secondary | ICD-10-CM

## 2024-02-04 ENCOUNTER — Encounter: Payer: Self-pay | Admitting: Internal Medicine

## 2024-02-04 ENCOUNTER — Telehealth: Payer: Self-pay | Admitting: Internal Medicine

## 2024-02-04 MED ORDER — OZEMPIC (2 MG/DOSE) 8 MG/3ML ~~LOC~~ SOPN: 2.0000 mg | PEN_INJECTOR | SUBCUTANEOUS | 3 refills | Status: DC

## 2024-02-04 NOTE — Telephone Encounter (Signed)
 MEDICATION: Ozempic   PHARMACY:  [facility] [phone] - [place] - [address] [place], [place] [address] Phone: [phone]  Fax: [phone]   HAS THE PATIENT CONTACTED THEIR PHARMACY?  Yes  LAST REFILL:  @@LASTREFILL @  IS THIS A 90 DAY SUPPLY : Yes  IS PATIENT OUT OF MEDICATION: No  IF NOT; HOW MUCH IS LEFT: 1 pen  LAST APPOINTMENT DATE: @11 /14/2025  NEXT APPOINTMENT DATE:@1 /22/2026  DO WE HAVE YOUR PERMISSION TO LEAVE A DETAILED MESSAGE?: Yes  OTHER COMMENTS:    **Let patient know to contact pharmacy at the end of the day to make sure medication is ready. **  ** Please notify patient to allow 48-72 hours to process**  **Encourage patient to contact the pharmacy for refills or they can request refills through [facility]**

## 2024-02-04 NOTE — Telephone Encounter (Signed)
 Requested Prescriptions   Signed Prescriptions Disp Refills   Semaglutide , 2 MG/DOSE, (OZEMPIC , 2 MG/DOSE,) 8 MG/3ML SOPN 6 mL 3    Sig: Inject 2 mg into the skin once a week.    Authorizing Provider: TRIXIE FILE    Ordering User: CLEOTILDE ROLIN RAMAN

## 2024-02-07 ENCOUNTER — Other Ambulatory Visit: Payer: Self-pay

## 2024-02-07 MED ORDER — OZEMPIC (2 MG/DOSE) 8 MG/3ML ~~LOC~~ SOPN: 2.0000 mg | PEN_INJECTOR | SUBCUTANEOUS | 3 refills | Status: AC

## 2024-02-22 ENCOUNTER — Other Ambulatory Visit: Payer: Self-pay | Admitting: Internal Medicine

## 2024-02-22 DIAGNOSIS — E1159 Type 2 diabetes mellitus with other circulatory complications: Secondary | ICD-10-CM

## 2024-02-24 NOTE — Telephone Encounter (Signed)
 RX addended to reflect most recent notes

## 2024-02-29 NOTE — Discharge Summary (Signed)
 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      Current Discharge Medication List     START taking these medications      Details  aspirin 81 mg chewable tablet Start taking on: March 01, 2024  Chew one tablet (81 mg dose) by mouth daily. Quantity: 30 tablet   clopidogrel bisulfate 75 mg tablet Commonly known as: PLAVIX Start taking on: March 01, 2024  Take one tablet (75 mg dose) by mouth daily. 21-day supply following a stroke, take along with aspirin.  After 21 days just take the aspirin Quantity: 21 tablet       CONTINUE these medications which have NOT CHANGED      Details  atorvastatin  20 mg tablet Commonly known as: LIPITOR  Take one tablet (20 mg dose) by mouth every morning.   * BASAGLAR  KWIKPEN 100 Unit/mL Sopn  Inject twenty Units into the skin at bedtime.   * BASAGLAR  KWIKPEN 100 Unit/mL Sopn  Inject twenty Units into the skin every morning.   ergocalciferol 50,000 units Caps capsule Commonly known as: Vitamin D2  Take one capsule (50,000 Units dose) by mouth every 4 (four) weeks.   furosemide  40 mg tablet Commonly known as: LASIX   Take one tablet (40 mg dose) by mouth daily.   lisinopril  10 mg tablet Commonly known as: PRINIVIL ,ZESTRIL   Take one tablet (10 mg dose) by mouth daily.   metoprolol  succinate  25 mg 24 hr tablet Commonly known as: TOPROL -XL  Take one tablet (25 mg dose) by mouth daily.   * NOVOLOG  FLEXPEN 100 UNIT/ML injection Generic drug: insulin  aspart  Inject twenty five Units into the skin 2 (two) times daily with meals. Breakfast and Supper Adjusts according to blood sugar   * NOVOLOG  FLEXPEN RELION 100 UNIT/ML injection Generic drug: insulin  aspart  Inject fifteen Units into the skin at noon. Take with meals.. Adjusts according to blood sugar   ondansetron  4 mg disintegrating tablet Commonly known as: ZOFRAN -ODT  Take one tablet (4 mg dose) by mouth every 8 (eight) hours as needed. Quantity: 12 tablet   oxybutynin 5 MG tablet Commonly known as: DITROPAN  Take one tablet (5 mg dose) by mouth 3 (three) times a day as needed (for bladder spasms) for up to 3 days. Quantity: 9 tablet   * OZEMPIC  (1 MG/DOSE) 2 MG/1.5ML Sopn  Inject into the skin once a week. Sunday   * OZEMPIC  (2 MG/DOSE) 8 MG/3ML Sopn pen Generic drug: Semaglutide  (2 MG/DOSE)  Inject 2 mg into the skin once a week at 0900.      * * This list has 6 medication(s) that are the same as other medications prescribed for you. Read the directions carefully, and ask your doctor or other care provider to review them with you.         *  You might also be taking other medications not listed above. If you have questions about any of your other medications, talk to the person who prescribed them or your Primary Care Provider.           Reason for medication changes: CVA  Recommendations to physicians/followup needed: Follow-up thyroid  nodule  Hospital Course   Indication for Admission: Acute neurologic symptoms, largely resolved, suspect TIA  HPI See H&P  Hospital Course:       She is overall doing well.  MRI of the brain did confirm 2 small acute infarcts in the left occipital lobe and medial temporal occipital region.  Her echocardiogram was reassuring.  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  They have ordered a 30-day event monitor.  See the neurologist note for additional details of their assessment.  Her other chronic conditions have remained stable.  Residual deficits from the stroke are minimal and she has declined PT/OT at home.  She will be discharging home later today.  General: comfortable CV: NR, RR, no murmur Pulm: clear, no wheezes or rhonchi. Abd: Soft, non-tender Ext: Warm, dry, no edema Skin: No new rashes or lesions Neuro: Alert, appears oriented Psych: No agitation or anxiety   Radiology for the past 3 days:  Echocardiogram Complete W Enhancing Agent Result Date: 02/28/2024   Left Ventricle: EF: 60-65%.   Left Atrium: Injection of agitated saline documents no interatrial shunt.   MRI Head WO Contrast Result Date: 02/29/2024 MRI BRAIN WITHOUT IV CONTRAST CLINICAL INDICATION: TIA COMPARISON: None TECHNIQUE: Multiplanar multisequence imaging of the brain was obtained without contrast. FINDINGS: The ventricles are normal in size. There is no hemorrhage, mass lesion, mass effect or midline shift. There are several small foci of diffusion restriction in left occipital lobe and medial temporo-occipital region. Bony and vascular structures are unremarkable. The visualized paranasal sinuses and mastoids are unremarkable.   IMPRESSION: 2 small foci of acute ischemia/infarct in left occipital lobe and medial temporal occipital region. ###CODE CRITICAL REPORT### Automated notification pathway concerning the above report was activated at the time below. ORDERING PROVIDER (Unless stated above may not be the provider contacted): FRANKY ICE TIME: 02/29/2024 1:28 AM Electronically Signed by: Sonny Livings, MD on 02/29/2024 1:29 AM  CT Code Stroke Head WO Contrast Result Date: 02/28/2024 INDICATION: Vision changes.. Weakness TECHNIQUE: Axial images were performed through the  head without contrast obtained on  02/28/2024 9:40 AM. Radiation dose reduction was utilized (automated exposure control, mA or kV adjustment based on patient size, or iterative image reconstruction). COMPARISON:  None. FINDINGS: Brain Parenchyma and Extra-axial Spaces: No acute abnormality.      - Hyperdense MCA: Absent.       - Hemorrhage: Absent.      - Early Ischemic Changes: Absent.  Additional Comments: - None.   IMPRESSION: 1.  No acute intracranial abnormality.  Specifically, there is no CT evidence of acute bleed or infarct. 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 Electronically Signed by: Valma Companion, MD on 02/28/2024 10:46 AM  Result Date: 02/28/2024 CT CODE STROKE HEAD NECK ANGIO INDICATION: Stroke/TIA signs & symptoms COMPARISON: None Available at Time of Dictation TECHNIQUE: Thin axial scans were performed through the head and neck after bolus injection of intravenous contrast. 3-D  MIP images were generated. The degree of cervical ICA stenosis (if any) was determined using NASCET-like measurement criteria: Severe: 70-99%; Moderate: 50-69%; Mild: Less than 50%. CONTRAST: 60 mL cc of IOPAMIDOL 76 % IV SOLN. FINDINGS: CTA head: Intracranial vessels: #  Distal internal carotid arteries: No occlusion or significant stenosis. #  Anterior cerebral arteries: No occlusion or significant stenosis. #  Middle cerebral arteries: No occlusion or significant stenosis. #  Distal vertebral arteries: No occlusion or significant stenosis. #  Basilar artery: No occlusion or significant stenosis. #  Posterior cerebral arteries: No occlusion or significant stenosis. #  Dural sinuses: Patent but not well evaluated secondary to arterial phase of contrast. CTA neck: #  Aortic arch and brachiocephalic vessels: No significant  abnormality. #  Right carotid artery: No occlusion or significant stenosis. #  Left carotid artery: No occlusion or significant stenosis. #  Right vertebral: No occlusion or significant stenosis. #  Left vertebral: No occlusion or significant stenosis. #  Additional findings: Right thyroid  nodule measures 4.8 x 3.0 cm. Further evaluation with a nonemergent thyroid  ultrasound is recommended.   IMPRESSION: 1.  No occlusion or significant stenosis. 2.  Right thyroid  nodule measures 4.8 x 3.0 cm. Further evaluation with a nonemergent thyroid  ultrasound is recommended. Electronically Signed by: Valma Companion, MD on 02/28/2024 10:35 AM   Labs on Discharge:  Recent Labs    Units 02/29/24 0159 02/28/24 1001  WBC thou/mcL 9.3 11.9*  HGB gm/dL 89.0* 87.1  HCT % 65.1 40.2  PLT thou/mcL 258 266   Recent Labs    Units 02/29/24 0159 02/28/24 1001  NA mmol/L 138 140  K mmol/L 3.8 3.7  CL mmol/L 106 112*  CO2 mmol/L 22 19*  BUN mg/dL 23 21  CREATININE mg/dL 8.39* 8.49*  CALCIUM  mg/dL 8.9 6.6*   Recent Labs    Units 02/28/24 1001  BILITOT mg/dL 0.4  AST U/L 16  ALT U/L 14  ALKPHOS U/L 90  ALBUMIN gm/dL 3.1*   Recent Labs    Units 02/29/24 0159  HGBA1C % 7.1*   Recent Labs    Units 02/28/24 1001  INR  1.1  PTT second(s) 24   Recent Labs    Units 02/29/24 0159  CHOL mg/dL 897  LDL mg/dL 39  HDL mg/dL 44  TRIG mg/dL 93   No results for input(s): TROPONIN, CK in the last 168 hours.  Invalid input(s): CK-MB  Diagnostics:     [facility]   Discharge Procedure Orders  Regular Diet   Notify Physician for trouble breathing or symptoms that are worse   Notify physician - Call your doctor if you experience nausea/vomiting   Activity as tolerated   Discharge instructions  Order Comments: You are hospitalized for further evaluation of acute neurologic symptoms.  Since these were improving rapidly on admission they were suspected to be a TIA/mini stroke  however the MRI of the brain shows a couple of true strokes.  They are small.  The neurologist are recommending 3 weeks of aspirin and Plavix together then just take the aspirin.  You should continue taking your statin.  Please follow-up with your medical doctor within a week or 2 for any additional recommendations.  The neurologist will typically follow-up with you as an outpatient as well in a couple of weeks in the [facility].   PR Xtrnl Mobile CV Telemetry W/I and Report 30 Days  Standing Status: Future Standing Exp. Date: 02/28/25  Scheduling Instructions: Urgent stroke patient  Order Specific Question Answer Comments  What cardiology group is performing this procedure? [facility]      Potential for Rehab:        Good Code Status:   Full Code Disposition: Home Consults: Neuro  Followup appointments: No future appointments.   Time spent in discharge process:  Total 45 minutes from 0800 to 1600.    Electronically signed: Lynwood JONELLE Madrid, MD 02/29/2024 / 3:54 PM  *Some images could not be shown.

## 2024-03-02 ENCOUNTER — Telehealth: Payer: Self-pay | Admitting: *Deleted

## 2024-03-02 NOTE — Transitions of Care (Post Inpatient/ED Visit) (Signed)
 "  03/02/2024  Name: Savannah Crawford MRN: [id number] DOB: 29-Apr-1955  Today's TOC FU Call Status: Today's TOC FU Call Status:: Successful TOC FU Call Completed TOC FU Call Complete Date: 03/02/24  Patient's Name and Date of Birth confirmed. Name, DOB  Transition Care Management Follow-up Telephone Call Date of Discharge: 02/29/24 Discharge Facility: Other ([facility]) Name of Other ([facility]) [facility]charge Facility: [facility] Type of Discharge: Inpatient Admission Primary Inpatient Discharge Diagnosis:: Hypocalcemia/2 small acute infarcts in the left occipital lobe and medial temporal occipital region. How have you been since you were released from the hospital?: Better Any questions or concerns?: No  Items Reviewed: Did you receive and understand the discharge instructions provided?: Yes Medications obtained,verified, and reconciled?: Yes (Medications Reviewed) Any new allergies since your discharge?: No Dietary orders reviewed?: No Do you have support at home?: Yes People in Home [RPT]: spouse Name of Support/Comfort Primary Source: Bruce  Medications Reviewed Today: Medications Reviewed Today     Reviewed by Kennieth Cathlean DEL, RN (Case Manager) on 03/02/24 at [phone]  Med List Status: <None>   Medication Order Taking? Sig Documenting Provider Last Dose Status Informant  aspirin 81 MG chewable tablet [id number] Yes Chew 81 mg by mouth daily. [provider]  Active   atorvastatin  (LIPITOR) 20 MG tablet [id number] Yes TAKE 1 TABLET BY MOUTH EVERY DAY Theophilus Andrews, Tully GRADE, MD  Active   benzonatate  (TESSALON ) 100 MG capsule [id number] Yes Take 1 capsule (100 mg total) by mouth 2 (two) times daily as needed for cough. Theophilus Andrews, Tully GRADE, MD  Active   Blood Glucose Monitoring Suppl (ACCU-CHEK GUIDE ME) w/Device KIT [id number] Yes Use as instructed to check blood sugar 4 times daily Trixie File, MD  Active   clopidogrel (PLAVIX) 75 MG tablet [id number]  Yes Take 75 mg by mouth daily. Start taking on: March 01, 2024 Take one tablet (75 mg dose) by mouth daily. 21-day supply following a stroke, take along with aspirin. After 21 days just take the aspirin [provider]  Active   Continuous Glucose Sensor (DEXCOM G7 SENSOR) MISC [id number] Yes USE AS INSTRUCTED TO CHECK BLOOD SUGAR. CHANGE EVERY 10 DAYS Trixie File, MD  Active   furosemide  (LASIX ) 40 MG tablet [id number] Yes TAKE 1 TABLET BY MOUTH TWICE A DAY Theophilus Andrews, Tully GRADE, MD  Active   insulin  aspart (NOVOLOG  FLEXPEN) 100 UNIT/ML FlexPen [id number] Yes INJECT UP TO 90 UNITS DAILY AS INSTRUCTED Trixie File, MD  Active   insulin  glargine (LANTUS  SOLOSTAR) 100 UNIT/ML Solostar Pen [id number] Yes Inject 20 Units into the skin 2 (two) times daily. Trixie File, MD  Active   Insulin  Pen Needle (B-D UF III MINI PEN NEEDLES) 31G X 5 MM MISC [id number] Yes Use to inject medication 5 times a day Trixie File, MD  Active   lisinopril  (ZESTRIL ) 10 MG tablet [id number] Yes TAKE 1 TABLET BY MOUTH EVERY DAY Theophilus Andrews, Tully GRADE, MD  Active   metoprolol  succinate (TOPROL -XL) 25 MG 24 hr tablet [id number] Yes TAKE 1 TABLET BY MOUTH EVERY DAY Theophilus Andrews, Tully GRADE, MD  Active   mupirocin ointment (BACTROBAN) 2 % [id number] Yes SMARTSIG:1 Application Topical 2-3 Times Daily [provider]  Active   Semaglutide , 2 MG/DOSE, (OZEMPIC , 2 MG/DOSE,) 8 MG/3ML SOPN [id number] Yes Inject 2 mg into the skin once a week. Trixie File, MD  Active   Vitamin D, Ergocalciferol, (DRISDOL) 1.25 MG (50000 UNIT) CAPS capsule [id number] Yes Take 1 capsule by mouth once a  week. [provider]  Active             Home Care and Equipment/Supplies: Were Home Health Services Ordered?: NA Any new equipment or medical supplies ordered?: NA  Functional Questionnaire: Do you need assistance with bathing/showering or dressing?: No Do you need assistance with meal  preparation?: No Do you need assistance with eating?: No Do you have difficulty maintaining continence: No Do you need assistance with getting out of bed/getting out of a chair/moving?: No Do you have difficulty managing or taking your medications?: No  Follow up appointments reviewed: PCP Follow-up appointment confirmed?: No MD Provider Line [phone] Given: Yes (Per patient she will make the appointment) Specialist Hospital Follow-up appointment confirmed?: Yes Date of Specialist follow-up appointment?: 03/04/24 Follow-Up Specialty Provider:: [id number]  0114 Dekarlos Dial podiatry/0122Christina Gherg[facility] Endocrin[facility] Do you need transportation to your follow-up appointment?: No Do you understand care options if your condition(s) worsen?: Yes-patient verbalized understanding  SDOH Interventions Today    Flowsheet Row Most Recent Value  SDOH Interventions   Food Insecurity Interventions Intervention Not Indicated  Housing Interventions Intervention Not Indicated  Transportation Interventions Intervention Not Indicated, Patient Resources (Friends/Family)  Utilities Interventions Intervention Not Indicated   Discussed and offered 30 day TOC program.  Patient  declined.  The patient has been provided with contact information for the care management team and has been advised to call with any health -related questions or concerns.  The patient verbalized understanding with current plan of care.  The patient is directed to their insurance card regarding availability of benefits coverage   RN discussed the importance of taking the Plavix and Aspirin.  RN discussed continuing the Aspirin after 21 day dose of Plavix  Cathlean Headland BSN RN [employer] [facility] Care Management Coordinator [email or web address] Direct Dial: [phone]  Fax: [phone] Website: [email or web address]  "

## 2024-03-03 NOTE — Progress Notes (Signed)
 CARE COORDINATOR CONTACT WITH PATIENT/CAREGIVER AFTER HOSPITAL DISCHARGE   Patient contacted.  Encouraged to schedule Hospital follow up appointment with their Non NH PCP. Pt states appt is Thursday.

## 2024-03-04 LAB — HM DIABETES FOOT EXAM

## 2024-03-05 ENCOUNTER — Ambulatory Visit: Admitting: Internal Medicine

## 2024-03-05 ENCOUNTER — Encounter: Payer: Self-pay | Admitting: Internal Medicine

## 2024-03-05 VITALS — BP 141/61 | HR 82 | Temp 97.9°F | Wt 297.5 lb

## 2024-03-05 DIAGNOSIS — Z8673 Personal history of transient ischemic attack (TIA), and cerebral infarction without residual deficits: Secondary | ICD-10-CM

## 2024-03-05 DIAGNOSIS — Z794 Long term (current) use of insulin: Secondary | ICD-10-CM

## 2024-03-05 DIAGNOSIS — Z09 Encounter for follow-up examination after completed treatment for conditions other than malignant neoplasm: Secondary | ICD-10-CM

## 2024-03-05 DIAGNOSIS — E1169 Type 2 diabetes mellitus with other specified complication: Secondary | ICD-10-CM

## 2024-03-05 DIAGNOSIS — E1159 Type 2 diabetes mellitus with other circulatory complications: Secondary | ICD-10-CM

## 2024-03-05 NOTE — Progress Notes (Signed)
 "    Hospital follow-up visit     CC/Reason for Visit: Hospitalization follow-up  HPI: Savannah Crawford is a 69 y.o. female who is coming in today for the above mentioned reasons, specifically transitional care services face-to-face visit.    Dates hospitalized: 1 9/26-1/11/2024 at [facility] Days since discharge from hospital: 5 Patient was discharged from the hospital to: Home Reason for admission to hospital: Acute CVA Date of interactive phone contact with patient and/or caregiver: 03/02/2024  I have reviewed in detail patient's discharge summary plus pertinent specific notes, labs, and images from the hospitalization.  Yes  Patient presented to the hospital with acute visual deficits that resolved rather quickly.  She was found on MRI to have acute CVA in the left occipital and medial temporal occipital lobe.  She was placed on aspirin and Plavix.  She is on a statin.  Echocardiogram was unchanged.  Hospital charts to note that they have sent an event monitor for her to wear.  A1c was 7.1.  She has follow-up with her endocrinologist soon.  She was advised to take aspirin and Plavix for 21 days and then aspirin alone.  Medication reconciliation was done today and patient is taking meds as recommended by discharging hospitalist/specialist.  Yes   Past Medical/Surgical History: Past Medical History:  Diagnosis Date   Anemia of chronic disease 11/17/2012   not on meds at this time   At risk for heart disease - +stres test 2012 followed by [facility], [place] with neg cardiac cath 03/2010 (non-obstructive CAD) per review of records, echo 02/2010 normal LVF 01/15/2012   Chronic kidney disease (CKD), stage IV (severe) (HCC) 01/09/2012   Diabetes mellitus with renal manifestations, uncontrolled    on meds   Diabetic neuropathy (HCC) 01/09/2012   Diastolic heart failure - stage 1 per review of echo results from 02/2010 01/15/2012   GERD (gastroesophageal reflux disease)    OTC  meds for tx   Hyperlipidemia    on meds   Hypertension    on meds   Obesity    Peripheral arterial disease? Eval by vascular 2013 with no LE PAD and advised to stop pletal    Severe obesity (BMI >= 40) (HCC) 11/13/2012   Toe ulcer, right (HCC)    Ulcer of toe of left foot (HCC)    treated by [facility] per patient   Venous stasis of lower extremity 03/10/2012    Past Surgical History:  Procedure Laterality Date   COLONOSCOPY  2013/2014   [place]- records not available    REFRACTIVE SURGERY Bilateral    TOE AMPUTATION Right 2008   RIGHT     Social History:  reports that she quit smoking about 25 years ago. Her smoking use included cigarettes. She has never used smokeless tobacco. She reports current alcohol use of about 4.0 standard drinks of alcohol per week. She reports that she does not currently use drugs.  Allergies: Allergies[1]  Family History:  Family History  Problem Relation Age of Onset   Diabetes Mother    Hyperlipidemia Mother    Hypertension Mother    Heart attack Mother    Stroke Mother    Diabetes Sister    Breast cancer Sister 33   Hypertension Brother    Diabetes Sister    Diabetes Sister    Obesity Other    Hypertension Other    Hyperlipidemia Other    Stroke Other    Heart attack Other    Colon  polyps Neg Hx    Colon cancer Neg Hx    Esophageal cancer Neg Hx    Rectal cancer Neg Hx    Stomach cancer Neg Hx     Current Medications[2]  Review of Systems:  Negative except as mentioned in HPI.    Physical Exam: Vitals:   03/05/24 1034 03/05/24 1037  BP: (!) 140/70 (!) 141/61  Pulse: 82   Temp: 97.9 F (36.6 C)   TempSrc: Oral   SpO2: 98%   Weight: 297 lb 8 oz (134.9 kg)     Body mass index is 42.69 kg/m.   Physical Exam Vitals reviewed.  Constitutional:      Appearance: Normal appearance. She is obese.  HENT:     Head: Normocephalic and atraumatic.  Eyes:     Conjunctiva/sclera: Conjunctivae normal.  Cardiovascular:      Rate and Rhythm: Normal rate and regular rhythm.  Pulmonary:     Effort: Pulmonary effort is normal.     Breath sounds: Normal breath sounds.  Skin:    General: Skin is warm and dry.  Neurological:     General: No focal deficit present.     Mental Status: She is alert and oriented to person, place, and time.  Psychiatric:        Mood and Affect: Mood normal.        Behavior: Behavior normal.        Thought Content: Thought content normal.        Judgment: Judgment normal.     Impression and Plan:  Hospital discharge follow-up  H/O: CVA (cerebrovascular accident)  Hypertension associated with diabetes (HCC)  Hyperlipidemia associated with type 2 diabetes mellitus (HCC)  Type 2 diabetes mellitus with stage 3b chronic kidney disease, with long-term current use of insulin  (HCC)  Morbid obesity [facility])  -Hospital charts have been reviewed in detail. - She understands to discontinue Plavix after 21 days. - Secondary stroke prophylaxis is important.  We have discussed lifestyle changes, she will continue diabetic care with her endocrinologist, her blood pressure is elevated and she will do ambulatory blood pressure monitoring and return in 3 months for follow-up. - Recheck lipids in 3 months.  Medical decision making of medium complexity was utilized today.      Tully Theophilus Andrews, MD [facility]     [1] No Known Allergies [2]  Current Outpatient Medications:    aspirin 81 MG chewable tablet, Chew 81 mg by mouth daily., Disp: , Rfl:    atorvastatin  (LIPITOR) 20 MG tablet, TAKE 1 TABLET BY MOUTH EVERY DAY, Disp: 90 tablet, Rfl: 1   Blood Glucose Monitoring Suppl (ACCU-CHEK GUIDE ME) w/Device KIT, Use as instructed to check blood sugar 4 times daily, Disp: 1 kit, Rfl: 0   clopidogrel (PLAVIX) 75 MG tablet, Take 75 mg by mouth daily. Start taking on: March 01, 2024 Take one tablet (75 mg dose) by mouth daily. 21-day supply following a stroke, take along with  aspirin. After 21 days just take the aspirin, Disp: , Rfl:    Continuous Glucose Sensor (DEXCOM G7 SENSOR) MISC, USE AS INSTRUCTED TO CHECK BLOOD SUGAR. CHANGE EVERY 10 DAYS, Disp: 9 each, Rfl: 3   furosemide  (LASIX ) 40 MG tablet, TAKE 1 TABLET BY MOUTH TWICE A DAY, Disp: 180 tablet, Rfl: 1   insulin  aspart (NOVOLOG  FLEXPEN) 100 UNIT/ML FlexPen, INJECT UP TO 90 UNITS DAILY AS INSTRUCTED, Disp: 60 mL, Rfl: 3   insulin  glargine (LANTUS  SOLOSTAR) 100 UNIT/ML Solostar Pen, Inject  20 Units into the skin 2 (two) times daily., Disp: 45 mL, Rfl: 3   Insulin  Pen Needle (B-D UF III MINI PEN NEEDLES) 31G X 5 MM MISC, Use to inject medication 5 times a day, Disp: 1000 each, Rfl: 3   lisinopril  (ZESTRIL ) 10 MG tablet, TAKE 1 TABLET BY MOUTH EVERY DAY, Disp: 90 tablet, Rfl: 0   metoprolol  succinate (TOPROL -XL) 25 MG 24 hr tablet, TAKE 1 TABLET BY MOUTH EVERY DAY, Disp: 90 tablet, Rfl: 0   mupirocin ointment (BACTROBAN) 2 %, SMARTSIG:1 Application Topical 2-3 Times Daily, Disp: , Rfl:    Semaglutide , 2 MG/DOSE, (OZEMPIC , 2 MG/DOSE,) 8 MG/3ML SOPN, Inject 2 mg into the skin once a week., Disp: 9 mL, Rfl: 3   Vitamin D, Ergocalciferol, (DRISDOL) 1.25 MG (50000 UNIT) CAPS capsule, Take 1 capsule by mouth once a week., Disp: , Rfl:   "

## 2024-03-12 ENCOUNTER — Telehealth: Payer: Self-pay | Admitting: Internal Medicine

## 2024-03-12 ENCOUNTER — Ambulatory Visit: Admitting: Internal Medicine

## 2024-03-12 ENCOUNTER — Encounter: Payer: Self-pay | Admitting: Internal Medicine

## 2024-03-12 VITALS — BP 138/70 | HR 73 | Ht 70.0 in | Wt 298.4 lb

## 2024-03-12 DIAGNOSIS — E1169 Type 2 diabetes mellitus with other specified complication: Secondary | ICD-10-CM | POA: Diagnosis not present

## 2024-03-12 DIAGNOSIS — E785 Hyperlipidemia, unspecified: Secondary | ICD-10-CM | POA: Diagnosis not present

## 2024-03-12 DIAGNOSIS — E66813 Obesity, class 3: Secondary | ICD-10-CM | POA: Diagnosis not present

## 2024-03-12 DIAGNOSIS — E1159 Type 2 diabetes mellitus with other circulatory complications: Secondary | ICD-10-CM | POA: Diagnosis not present

## 2024-03-12 DIAGNOSIS — E1165 Type 2 diabetes mellitus with hyperglycemia: Secondary | ICD-10-CM

## 2024-03-12 DIAGNOSIS — Z6841 Body Mass Index (BMI) 40.0 and over, adult: Secondary | ICD-10-CM | POA: Diagnosis not present

## 2024-03-12 DIAGNOSIS — Z794 Long term (current) use of insulin: Secondary | ICD-10-CM

## 2024-03-12 DIAGNOSIS — Z7985 Long-term (current) use of injectable non-insulin antidiabetic drugs: Secondary | ICD-10-CM | POA: Diagnosis not present

## 2024-03-12 DIAGNOSIS — E042 Nontoxic multinodular goiter: Secondary | ICD-10-CM | POA: Diagnosis not present

## 2024-03-12 NOTE — Telephone Encounter (Signed)
 Patient is aware.

## 2024-03-12 NOTE — Patient Instructions (Addendum)
 Please use the following regimen: - Lantus  20 units in a.m. and 20 units at night - Novolog  dose: 20-25 units 15 min before every meal and 5-10 units before a snack - Ozempic  1 mg weekly + 0.25 mg weekly  On the Ozempic  1 mg pen: - 18 clicks  - 0.25 mg - 36 clicks - 0.5 mg - 54 clicks - 0.75 mg - 72 clicks - 1 mg  Please return in 3-4 months.

## 2024-03-12 NOTE — Telephone Encounter (Signed)
 Copied from CRM [id number]. Topic: General - Other >> Mar 12, 2024 10:38 AM Joesph NOVAK wrote: Reason for CRM: patient has not received heart monitor nor has she received a call from a cardiologist. Annett fu. [phone]

## 2024-03-12 NOTE — Telephone Encounter (Signed)
 Pt is calling and would like to know if md is going to referral her to cardiologist and what is the status of heart monitor

## 2024-03-12 NOTE — Telephone Encounter (Signed)
 Pt called for status update. Please return pt call

## 2024-03-12 NOTE — Progress Notes (Signed)
 Subjective:     Patient ID: Savannah Crawford, female   DOB: December 05, 1955, 69 y.o.   MRN: [id number]  HPI Savannah Crawford is a 69 y.o. woman returning for f/u for DM2, insulin -dependent, uncontrolled, with complications (CAD, CKD, diabetes neuropathy, diabetic retinopathy, PVD, amputated toe-R), multinodular goiter. Last visit 4 months ago.  Interim history: No increased urination, blurry vision, nausea, chest pain.   She was admitted 02/28/2024 with TIA. She mentions waking up with had nausea and diarrhea in the middle of the night and then going back to sleep and waking up very tired.  She then developed vision loss which prompted her to go to the emergency room.  Her symptoms resolved.  She was discharged on Plavix.  She will need a heart monitor. During the admission, she had another HbA1c which was 7.1%, lower.   DM2: Reviewed HbA1c levels: 02/29/2024: HbA1c 7.1% Lab Results  Component Value Date   HGBA1C 8.1 (A) 11/07/2023   HGBA1C 7.9 (A) 05/23/2023   HGBA1C 8.0 (A) 01/31/2023  09/14/2021: HbA1c 7.9%  She is on: - Basaglar  40 units in a.m. and 30-40 units at night  >> 25 >> Lantus /Basaglar  20 units 2x a day - Novolog  dose: (15-25) - (15-20) - (15-20) units before meals (but forgets to take it 15 minutes before meals)  >> 20-25 units before meals - Novolog  Sliding scale: - 150- 165: + 1 unit  - 166- 180: + 2 units  - 181- 195: + 3 units  - 196- 210: + 4 units  - >210: + 5 units  Take NovoLog  15 minutes before every meal. - Ozempic  0.5 mg weekly- started 03/2020 >> 1 mg weekly  I again suggested Ozempic  0.5 mg weekly in the past, also: 11/2018 >> did not start as she did not want to add another med... In 03/2019, she did try to start it but this was expensive. She was on metformin in the past, but stopped 2/2 CKD.  Tried Victoza before >> cannot remember SE.  Checks sugars >4x a day with the CGM (with Receiver):  Prev.:  Prev.:  Prev.:  Lowest: 68 >> ...  64 >> 60s >>  63. Highest: upper 200s >> 300s >> 300 (forgot insulin )  Meter: Freestyle Lite  + CKD-sees nephrology: 02/29/2024: 23/1.6, GFR 35, glucose 118 Lab Results  Component Value Date   BUN 16 01/06/2024   CREATININE 1.4 (A) 01/06/2024   Lab Results  Component Value Date   MICRALBCREAT 19 01/06/2024   MICRALBCREAT 19 01/06/2024   MICRALBCREAT 9 05/07/2023   MICRALBCREAT 4 10/30/2022  On lisinopril  10.  + HL; Latest lipids: 02/29/2024: 102/93/44/39 Lab Results  Component Value Date   CHOL 124 05/23/2023   HDL 51 05/23/2023   LDLCALC 56 05/23/2023   TRIG 83 05/23/2023   CHOLHDL 2.4 05/23/2023  On Lipitor 20.  Last eye exam 03/21/2023: + DR, + cataract reportedly.   + Stable numbness and tingling in her feet.  She sees podiatry.  She has a history of left third toe ulcer.  She saw Dr. Joaquim 03/04/2024.  She has a history of skin graft fourth toe ulcer. She had normal ABIs 03/28/2023.  MNG:  Thyroid  ultrasound (05/24/2022): Parenchymal Echotexture: Moderately heterogeneous  Isthmus: 1.9 cm  Right lobe: 5.0 x 2.9 x 2.7 cm  Left lobe: 4.1 x 1.5 x 1.8 cm  _________________________________________________________   Estimated total number of nodules >/= 1 cm: 2 _________________________________________________________   Nodule # 1:  Location: Isthmus; superior  Maximum  size: 1.6 cm; Other 2 dimensions: 1.5 x 0.9 cm  Composition: solid/almost completely solid (2)  Echogenicity: isoechoic (1) *Given size (>/= 1.5 - 2.4 cm) and appearance, a follow-up ultrasound in 1 year should be considered based on TI-RADS criteria.  _________________________________________________________   Nodule # 2:  Location: Right; inferior  Maximum size: 4.8 cm; Other 2 dimensions: 2.6 x 4.6 cm  Composition: mixed cystic and solid (1)  Echogenicity: isoechoic (1) This nodule does NOT meet TI-RADS criteria for biopsy or dedicated follow-up.  ____________________________________________________    Nodule 3: 0.6 cm spongiform nodule in the inferior left thyroid  lobe does not meet criteria for imaging surveillance or FNA.   IMPRESSION: Nodule 1 (TI-RADS 3), measuring 1.6 cm, located in the isthmus meets criteria for imaging follow-up. Annual ultrasound surveillance is recommended until 5 years of stability is documented.  Thyroid  U/S (05/27/2023): Parenchymal Echotexture: Moderately heterogeneous  Isthmus: 1.0 cm  Right lobe: 5.4 x 3.2 x 3.0 cm  Left lobe: 3.6 x 1.3 x 1.3 cm  _________________________________________________________   Estimated total number of nodules >/= 1 cm: 2 _________________________________________________________   Nodule # 1:  Prior biopsy: No  Location: Isthmus; Superior  Maximum size: 1.5 cm; Other 2 dimensions: 1.0 x 1.4 cm, previously, 1.6 x 0.9 x 1.5 cm Composition: solid/almost completely solid (2)  Echogenicity: isoechoic (1) *Given size (>/= 1.5 - 2.4 cm) and appearance, a follow-up ultrasound in 1 year should be considered based on TI-RADS criteria.  _________________________________________________________   Nodule # 2:  Prior biopsy: No  Location: Right; inferior  Maximum size: 4.1 cm; Other 2 dimensions: 4.0 x 3.0 cm, previously, 4.8 x 4.6 x 2.6 cm  Composition: mixed cystic and solid (1)  Echogenicity: isoechoic (1) This nodule does NOT meet TI-RADS criteria for biopsy or dedicated follow-up. _________________________________________________________   Nodule 3: 0.6 cm spongiform nodule in the inferior left thyroid  lobe is unchanged from prior exam and does not meet criteria for FNA or imaging follow-up.   IMPRESSION: 1. Unchanged multinodular goiter. 2. Nodule 1 (TI-RADS 3), measuring 1.5 cm, located in the isthmus is unchanged from the prior examination and still meets criteria for imaging follow-up. Annual ultrasound surveillance is recommended until 5 years of stability is documented.  She denies: - feeling nodules in  neck - hoarseness - dysphagia - choking -only occasionally, with drier foods  She has a history of nonobstructive CAD, with positive stress test but a negative cardiac cath 2012, and also has diastolic heart failure. She has HTN, venous stasis, vitamin D deficiency.  She denies family history of medullary thyroid  cancer and no personal history of pancreatitis.  Review of Systems + see HPI  I reviewed pt's medications, allergies, PMH, social hx, family hx, and changes were documented in the history of present illness. Otherwise, unchanged from my initial visit note.  Past Medical History:  Diagnosis Date   Anemia of chronic disease 11/17/2012   not on meds at this time   At risk for heart disease - +stres test 2012 followed by [facility], [place] with neg cardiac cath 03/2010 (non-obstructive CAD) per review of records, echo 02/2010 normal LVF 01/15/2012   Chronic kidney disease (CKD), stage IV (severe) (HCC) 01/09/2012   Diabetes mellitus with renal manifestations, uncontrolled    on meds   Diabetic neuropathy (HCC) 01/09/2012   Diastolic heart failure - stage 1 per review of echo results from 02/2010 01/15/2012   GERD (gastroesophageal reflux disease)    OTC meds for tx  Hyperlipidemia    on meds   Hypertension    on meds   Obesity    Peripheral arterial disease? Eval by vascular 2013 with no LE PAD and advised to stop pletal    Severe obesity (BMI >= 40) (HCC) 11/13/2012   Toe ulcer, right (HCC)    Ulcer of toe of left foot (HCC)    treated by [facility] per patient   Venous stasis of lower extremity 03/10/2012   Past Surgical History:  Procedure Laterality Date   COLONOSCOPY  2013/2014   [place]- records not available    REFRACTIVE SURGERY Bilateral    TOE AMPUTATION Right 2008   RIGHT    Social History   Socioeconomic History   Marital status: Married    Spouse name: Not on file   Number of children: 2   Years of education: Not on file   Highest education  level: GED or equivalent  Occupational History   Not on file  Tobacco Use   Smoking status: Former    Current packs/day: 0.00    Types: Cigarettes    Quit date: 02/20/1999    Years since quitting: 25.0   Smokeless tobacco: Never  Vaping Use   Vaping status: Never Used  Substance and Sexual Activity   Alcohol use: Yes    Alcohol/week: 4.0 standard drinks of alcohol    Types: 4 Standard drinks or equivalent per week    Comment: Occasional Drinks   Drug use: Not Currently   Sexual activity: Not on file  Other Topics Concern   Not on file  Social History Narrative   Are you right handed or left handed? Right Handed    Are you currently employed ? Yes. Work 2-3 days at [employer] is your current occupation?   Do you live at home alone? No   Who lives with you? Husband, daughter, and granddaughter    What type of home do you live in: 1 story or 2 story? One story home        Social Drivers of Health   Tobacco Use: Medium Risk (03/05/2024)   Patient History    Smoking Tobacco Use: Former    Smokeless Tobacco Use: Never    Passive Exposure: Not on file  Financial Resource Strain: Low Risk (02/28/2023)   Overall Financial Resource Strain (CARDIA)    Difficulty of Paying Living Expenses: Not hard at all  Food Insecurity: No Food Insecurity (03/02/2024)   Epic    Worried About Programme Researcher, Broadcasting/film/video in the Last Year: Never true    Ran Out of Food in the Last Year: Never true  Transportation Needs: No Transportation Needs (03/02/2024)   Epic    Lack of Transportation (Medical): No    Lack of Transportation (Non-Medical): No  Physical Activity: Sufficiently Active (02/28/2023)   Exercise Vital Sign    Days of Exercise per Week: 3 days    Minutes of Exercise per Session: 60 min  Stress: No Stress Concern Present (02/28/2024)   Received from [facility] - Occupational Stress Questionnaire    Do you feel stress - tense, restless, nervous,  or anxious, or unable to sleep at night because your mind is troubled all the time - these days?: Only a little  Social Connections: Moderately Integrated (02/28/2023)   Social Connection and Isolation Panel    Frequency of Communication with Friends and Family: More than three times a week  Frequency of Social Gatherings with Friends and Family: Three times a week    Attends Religious Services: More than 4 times per year    Active Member of Clubs or Organizations: No    Attends Banker Meetings: Never    Marital Status: Married  Catering Manager Violence: Not At Risk (03/02/2024)   Epic    Fear of Current or Ex-Partner: No    Emotionally Abused: No    Physically Abused: No    Sexually Abused: No  Depression (PHQ2-9): Low Risk (03/02/2024)   Depression (PHQ2-9)    PHQ-2 Score: 0  Alcohol Screen: Low Risk (02/28/2023)   Alcohol Screen    Last Alcohol Screening Score (AUDIT): 1  Housing: Low Risk (03/02/2024)   Epic    Unable to Pay for Housing in the Last Year: No    Number of Times Moved in the Last Year: 0    Homeless in the Last Year: No  Utilities: Not At Risk (03/02/2024)   Epic    Threatened with loss of utilities: No  Health Literacy: Adequate Health Literacy (02/28/2023)   B1300 Health Literacy    Frequency of need for help with medical instructions: Never   Current Outpatient Medications on File Prior to Visit  Medication Sig Dispense Refill   aspirin 81 MG chewable tablet Chew 81 mg by mouth daily.     atorvastatin  (LIPITOR) 20 MG tablet TAKE 1 TABLET BY MOUTH EVERY DAY 90 tablet 1   Blood Glucose Monitoring Suppl (ACCU-CHEK GUIDE ME) w/Device KIT Use as instructed to check blood sugar 4 times daily 1 kit 0   clopidogrel (PLAVIX) 75 MG tablet Take 75 mg by mouth daily. Start taking on: March 01, 2024 Take one tablet (75 mg dose) by mouth daily. 21-day supply following a stroke, take along with aspirin. After 21 days just take the aspirin     Continuous Glucose  Sensor (DEXCOM G7 SENSOR) MISC USE AS INSTRUCTED TO CHECK BLOOD SUGAR. CHANGE EVERY 10 DAYS 9 each 3   furosemide  (LASIX ) 40 MG tablet TAKE 1 TABLET BY MOUTH TWICE A DAY 180 tablet 1   insulin  aspart (NOVOLOG  FLEXPEN) 100 UNIT/ML FlexPen INJECT UP TO 90 UNITS DAILY AS INSTRUCTED 60 mL 3   insulin  glargine (LANTUS  SOLOSTAR) 100 UNIT/ML Solostar Pen Inject 20 Units into the skin 2 (two) times daily. 45 mL 3   Insulin  Pen Needle (B-D UF III MINI PEN NEEDLES) 31G X 5 MM MISC Use to inject medication 5 times a day 1000 each 3   lisinopril  (ZESTRIL ) 10 MG tablet TAKE 1 TABLET BY MOUTH EVERY DAY 90 tablet 0   metoprolol  succinate (TOPROL -XL) 25 MG 24 hr tablet TAKE 1 TABLET BY MOUTH EVERY DAY 90 tablet 0   mupirocin ointment (BACTROBAN) 2 % SMARTSIG:1 Application Topical 2-3 Times Daily     Semaglutide , 2 MG/DOSE, (OZEMPIC , 2 MG/DOSE,) 8 MG/3ML SOPN Inject 2 mg into the skin once a week. 9 mL 3   Vitamin D, Ergocalciferol, (DRISDOL) 1.25 MG (50000 UNIT) CAPS capsule Take 1 capsule by mouth once a week.     No current facility-administered medications on file prior to visit.   No Known Allergies Family History  Problem Relation Age of Onset   Diabetes Mother    Hyperlipidemia Mother    Hypertension Mother    Heart attack Mother    Stroke Mother    Diabetes Sister    Breast cancer Sister 56   Hypertension Brother  Diabetes Sister    Diabetes Sister    Obesity Other    Hypertension Other    Hyperlipidemia Other    Stroke Other    Heart attack Other    Colon polyps Neg Hx    Colon cancer Neg Hx    Esophageal cancer Neg Hx    Rectal cancer Neg Hx    Stomach cancer Neg Hx    Objective:   Physical Exam BP 138/70   Pulse 73   Ht 5' 10 (1.778 m)   Wt 298 lb 6.4 oz (135.4 kg)   SpO2 98%   BMI 42.82 kg/m   Wt Readings from Last 10 Encounters:  03/12/24 298 lb 6.4 oz (135.4 kg)  03/05/24 297 lb 8 oz (134.9 kg)  11/13/23 297 lb 6.4 oz (134.9 kg)  11/07/23 (!) 302 lb 3.2 oz (137.1 kg)   07/08/23 279 lb 6.4 oz (126.7 kg)  05/23/23 297 lb 12.8 oz (135.1 kg)  05/16/23 300 lb (136.1 kg)  04/04/23 295 lb 12.8 oz (134.2 kg)  01/31/23 296 lb 3.2 oz (134.4 kg)  11/29/22 297 lb 8 oz (134.9 kg)   Constitutional: overweight, in NAD Eyes:  no exophthalmos ENT:  + thyromegaly, no cervical lymphadenopathy Cardiovascular: irreg. Irreg. Rhythm, RR, No MRG, + B LE edema Respiratory: CTA B Musculoskeletal: no deformities Skin: no rashes Neurological: no tremor with outstretched hands  Assessment:     1. DM2, insulin -dependent, uncontrolled, with complications - nonobstructive CAD - CKD - diabetes neuropathy - diabetic retinopathy - PVD - R amputated toe  2. HL  3. Obesity class 3  4.  Isthmic thyroid  nodule    Plan:     1. Pt with uncontrolled type 2 diabetes, on basal-bolus insulin  regimen and weekly GLP-1 receptor agonist, with still poor control.  HbA1c at last visit was 8.5%.  Sugars were improving overnight but they were increasing after every meal, particularly after breakfast.  Upon questioning, she was forgetting to take her NovoLog  dose 15 minutes before each meal.  We discussed that this is very important and we also increase the dose of her Ozempic .  Since she had 1 mg pens at home, I asked her how to use this but I advised her to let me know when I needed to call in the 2 mg pen to her pharmacy. -She  sees nephrology -she was suggested an SGLT2 inhibitor if the patient developed proteinuria.  Latest ACR was normal, 19 -Latest HbA1c was obtained 13 days ago and this was better, at 7.1%, only slightly above target. CGM interpretation: -At today's visit, we reviewed her CGM downloads: It appears that 81% of values are in target range (goal >70%), while 19% are higher than 180 (goal <25%), and 0% are lower than 70 (goal <4%).  The calculated average blood sugar is 150.  The projected HbA1c for the next 3 months (GMI) is 6.9%. -Reviewing the CGM trends, sugars are  fluctuating mostly within the target range, the best I have seen for her in a long time.  She does have an occasional increase in blood sugars after breakfast and dinner, but otherwise sugars are much improved.  She does mention that she missed her insulin  injections for a whole day when she was out of town at the recently.  We discussed about trying to avoid instances like this as it can increase the risk of stroke. - We did discuss about trying to increase the dose of the but I am reticent to do  this quite right now due to her recent episode of nausea and vomiting.  She is not sure why she had this episode but this is not a common occurrence for her.  For now, especially in the setting of the improved blood sugars, we discussed about trying her best to take all of the insulin  doses consistently, but we will continue the same regimen. -  I advised her to: Patient Instructions  Please use the following regimen: - Lantus  20 units in a.m. and 20 units at night - Novolog  dose: 20-25 units 15 min before every meal and 5-10 units before a snack - Ozempic  1 mg weekly + 0.25 mg weekly  On the Ozempic  1 mg pen: - 18 clicks  - 0.25 mg - 36 clicks - 0.5 mg - 54 clicks - 0.75 mg - 72 clicks - 1 mg  Please return in 3-4 months.  - advised to check sugars at different times of the day - 4x a day, rotating check times - advised for yearly eye exams >> she is UTD - return to clinic in 3-4 months  2. HL - Latest lipid panel showed fractions at goal on 02/29/2024: 102/93/44/39 -She continues on Lipitor 20 mg daily without side effects  3.  Obesity class III - She continues on Ozempic  which should also help with weight loss.  We adjusted the dose at last visit. - Before last visit, she gained a net 5 pounds - she lost 4 pounds since then  4.  Thyroid  nodules - She had a mass palpated in the central lower neck previously - We checked a thyroid  ultrasound (05/24/2022) and this showed an isthmic nodule,  without worrisome features, but which required a 1 year follow-up. She also had a subcentimeter, spongiform, thyroid  nodule in the inferior left lobe, for which no intervention or follow-up was needed. Before last visit, we checked another thyroid  ultrasound (05/27/2023).  The isthmic nodule still needs 1 year follow-up - No neck compression symptoms - plan to reorder ultrasound at next visit  At today's visit, patient appears to have an irregularly irregular rhythm.  She is supposed to start on a heart monitor, but she did not receive this through mail yet.  She is planning to contact PCP about further plan.  She is on Plavix.  Lela Fendt, MD PhD [facility]

## 2024-03-13 ENCOUNTER — Other Ambulatory Visit: Payer: Self-pay | Admitting: Internal Medicine

## 2024-03-13 DIAGNOSIS — E1169 Type 2 diabetes mellitus with other specified complication: Secondary | ICD-10-CM

## 2024-06-03 ENCOUNTER — Ambulatory Visit: Admitting: Internal Medicine

## 2024-07-10 ENCOUNTER — Ambulatory Visit: Admitting: Internal Medicine
